# Patient Record
Sex: Female | Born: 1945
Health system: Southern US, Community
[De-identification: ages and names within clinical notes are randomized; demographics above are authoritative.]

## PROBLEM LIST (undated history)

## (undated) DIAGNOSIS — R740 Nonspecific elevation of levels of transaminase and lactic acid dehydrogenase [LDH]: Secondary | ICD-10-CM

## (undated) DIAGNOSIS — E039 Hypothyroidism, unspecified: Secondary | ICD-10-CM

## (undated) DIAGNOSIS — Z79899 Other long term (current) drug therapy: Secondary | ICD-10-CM

## (undated) DIAGNOSIS — F32A Depression, unspecified: Secondary | ICD-10-CM

## (undated) DIAGNOSIS — R5381 Other malaise: Secondary | ICD-10-CM

## (undated) DIAGNOSIS — F419 Anxiety disorder, unspecified: Secondary | ICD-10-CM

## (undated) DIAGNOSIS — R7401 Elevation of levels of liver transaminase levels: Secondary | ICD-10-CM

## (undated) DIAGNOSIS — E0590 Thyrotoxicosis, unspecified without thyrotoxic crisis or storm: Secondary | ICD-10-CM

## (undated) DIAGNOSIS — M545 Low back pain, unspecified: Secondary | ICD-10-CM

## (undated) DIAGNOSIS — R609 Edema, unspecified: Secondary | ICD-10-CM

## (undated) DIAGNOSIS — M8448XA Pathological fracture, other site, initial encounter for fracture: Secondary | ICD-10-CM

## (undated) DIAGNOSIS — K219 Gastro-esophageal reflux disease without esophagitis: Secondary | ICD-10-CM

## (undated) DIAGNOSIS — R5383 Other fatigue: Secondary | ICD-10-CM

## (undated) DIAGNOSIS — I10 Essential (primary) hypertension: Secondary | ICD-10-CM

## (undated) DIAGNOSIS — R000 Tachycardia, unspecified: Secondary | ICD-10-CM

## (undated) DIAGNOSIS — M797 Fibromyalgia: Secondary | ICD-10-CM

## (undated) DIAGNOSIS — R7402 Elevation of levels of lactic acid dehydrogenase (LDH): Secondary | ICD-10-CM

## (undated) DIAGNOSIS — R55 Syncope and collapse: Secondary | ICD-10-CM

## (undated) DIAGNOSIS — K2100 Gastro-esophageal reflux disease with esophagitis, without bleeding: Secondary | ICD-10-CM

## (undated) DIAGNOSIS — G4700 Insomnia, unspecified: Secondary | ICD-10-CM

## (undated) DIAGNOSIS — R7989 Other specified abnormal findings of blood chemistry: Secondary | ICD-10-CM

## (undated) DIAGNOSIS — L659 Nonscarring hair loss, unspecified: Secondary | ICD-10-CM

## (undated) DIAGNOSIS — R300 Dysuria: Secondary | ICD-10-CM

## (undated) DIAGNOSIS — D649 Anemia, unspecified: Secondary | ICD-10-CM

## (undated) DIAGNOSIS — F329 Major depressive disorder, single episode, unspecified: Secondary | ICD-10-CM

## (undated) DIAGNOSIS — R259 Unspecified abnormal involuntary movements: Secondary | ICD-10-CM

## (undated) DIAGNOSIS — F319 Bipolar disorder, unspecified: Secondary | ICD-10-CM

## (undated) HISTORY — DX: Fibromyalgia: M79.7

## (undated) HISTORY — DX: Elevation of levels of liver transaminase levels: R74.01

## (undated) HISTORY — DX: Low back pain: M54.5

## (undated) HISTORY — DX: Other long term (current) drug therapy: Z79.899

## (undated) HISTORY — DX: Nonspecific elevation of levels of transaminase and lactic acid dehydrogenase (ldh): R74.0

## (undated) HISTORY — DX: Gastro-esophageal reflux disease with esophagitis, without bleeding: K21.00

## (undated) HISTORY — DX: Low back pain, unspecified: M54.50

## (undated) HISTORY — DX: Other fatigue: R53.83

## (undated) HISTORY — DX: Syncope and collapse: R55

## (undated) HISTORY — DX: Essential (primary) hypertension: I10

## (undated) HISTORY — DX: Nonscarring hair loss, unspecified: L65.9

## (undated) HISTORY — DX: Elevation of levels of lactic acid dehydrogenase (LDH): R74.02

## (undated) HISTORY — DX: Edema, unspecified: R60.9

## (undated) HISTORY — DX: Gastro-esophageal reflux disease without esophagitis: K21.9

## (undated) HISTORY — DX: Thyrotoxicosis, unspecified without thyrotoxic crisis or storm: E05.90

## (undated) HISTORY — DX: Other specified abnormal findings of blood chemistry: R79.89

## (undated) HISTORY — DX: Hypothyroidism, unspecified: E03.9

## (undated) HISTORY — DX: Other malaise: R53.81

## (undated) HISTORY — DX: Tachycardia, unspecified: R00.0

## (undated) HISTORY — DX: Insomnia, unspecified: G47.00

## (undated) HISTORY — DX: Unspecified abnormal involuntary movements: R25.9

## (undated) HISTORY — DX: Anxiety disorder, unspecified: F41.9

## (undated) HISTORY — DX: Depression, unspecified: F32.A

## (undated) HISTORY — PX: TONSILLECTOMY: SHX5217

## (undated) HISTORY — DX: Major depressive disorder, single episode, unspecified: F32.9

## (undated) HISTORY — DX: Pathological fracture, other site, initial encounter for fracture: M84.48XA

## (undated) HISTORY — DX: Gastro-esophageal reflux disease with esophagitis: K21.0

## (undated) HISTORY — PX: LAPAROSCOPIC TUBAL LIGATION: SHX1937

## (undated) HISTORY — DX: Bipolar disorder, unspecified: F31.9

## (undated) HISTORY — DX: Anemia, unspecified: D64.9

## (undated) HISTORY — DX: Dysuria: R30.0

---

## 1998-01-24 ENCOUNTER — Other Ambulatory Visit: Admission: RE | Admit: 1998-01-24 | Discharge: 1998-01-24 | Payer: Self-pay | Admitting: Internal Medicine

## 1999-02-14 ENCOUNTER — Emergency Department (HOSPITAL_COMMUNITY): Admission: EM | Admit: 1999-02-14 | Discharge: 1999-02-14 | Payer: Self-pay | Admitting: Emergency Medicine

## 1999-04-29 ENCOUNTER — Ambulatory Visit (HOSPITAL_COMMUNITY): Admission: RE | Admit: 1999-04-29 | Discharge: 1999-04-29 | Payer: Self-pay | Admitting: Internal Medicine

## 1999-04-29 ENCOUNTER — Encounter: Payer: Self-pay | Admitting: Internal Medicine

## 2002-02-09 ENCOUNTER — Encounter: Payer: Self-pay | Admitting: Internal Medicine

## 2002-02-09 ENCOUNTER — Ambulatory Visit (HOSPITAL_COMMUNITY): Admission: RE | Admit: 2002-02-09 | Discharge: 2002-02-09 | Payer: Self-pay | Admitting: Internal Medicine

## 2009-03-03 ENCOUNTER — Encounter: Admission: RE | Admit: 2009-03-03 | Discharge: 2009-03-03 | Payer: Self-pay | Admitting: Internal Medicine

## 2009-03-13 ENCOUNTER — Ambulatory Visit (HOSPITAL_COMMUNITY): Admission: RE | Admit: 2009-03-13 | Discharge: 2009-03-13 | Payer: Self-pay | Admitting: Internal Medicine

## 2009-03-17 ENCOUNTER — Encounter: Admission: RE | Admit: 2009-03-17 | Discharge: 2009-03-17 | Payer: Self-pay | Admitting: Internal Medicine

## 2009-04-03 ENCOUNTER — Encounter: Admission: RE | Admit: 2009-04-03 | Discharge: 2009-04-03 | Payer: Self-pay | Admitting: Internal Medicine

## 2009-04-23 ENCOUNTER — Ambulatory Visit (HOSPITAL_COMMUNITY): Admission: RE | Admit: 2009-04-23 | Discharge: 2009-04-23 | Payer: Self-pay | Admitting: Internal Medicine

## 2009-11-05 ENCOUNTER — Encounter: Admission: RE | Admit: 2009-11-05 | Discharge: 2010-01-26 | Payer: Self-pay | Admitting: Internal Medicine

## 2010-04-23 ENCOUNTER — Ambulatory Visit (HOSPITAL_COMMUNITY): Admission: RE | Admit: 2010-04-23 | Discharge: 2010-04-23 | Payer: Self-pay | Admitting: Internal Medicine

## 2010-09-28 ENCOUNTER — Encounter: Payer: Self-pay | Admitting: Internal Medicine

## 2010-10-07 HISTORY — PX: CHOLECYSTECTOMY: SHX55

## 2010-10-29 ENCOUNTER — Ambulatory Visit
Admission: RE | Admit: 2010-10-29 | Discharge: 2010-10-29 | Disposition: A | Discharge status: Home / Self Care | Payer: MEDICARE | Source: Ambulatory Visit | Attending: Internal Medicine | Admitting: Internal Medicine

## 2010-10-29 ENCOUNTER — Other Ambulatory Visit: Payer: Self-pay | Admitting: Internal Medicine

## 2010-10-29 DIAGNOSIS — R748 Abnormal levels of other serum enzymes: Secondary | ICD-10-CM

## 2010-10-30 ENCOUNTER — Emergency Department (HOSPITAL_COMMUNITY): Payer: Medicare Other

## 2010-10-30 ENCOUNTER — Other Ambulatory Visit: Payer: Self-pay | Admitting: General Surgery

## 2010-10-30 ENCOUNTER — Observation Stay (HOSPITAL_COMMUNITY)
Admission: EM | Admit: 2010-10-30 | Discharge: 2010-10-31 | Disposition: A | Discharge status: Home / Self Care | Payer: Medicare Other | Attending: Surgery | Admitting: Surgery

## 2010-10-30 DIAGNOSIS — Z79899 Other long term (current) drug therapy: Secondary | ICD-10-CM | POA: Insufficient documentation

## 2010-10-30 DIAGNOSIS — M549 Dorsalgia, unspecified: Secondary | ICD-10-CM | POA: Insufficient documentation

## 2010-10-30 DIAGNOSIS — K219 Gastro-esophageal reflux disease without esophagitis: Secondary | ICD-10-CM | POA: Insufficient documentation

## 2010-10-30 DIAGNOSIS — R52 Pain, unspecified: Secondary | ICD-10-CM

## 2010-10-30 DIAGNOSIS — I10 Essential (primary) hypertension: Secondary | ICD-10-CM | POA: Insufficient documentation

## 2010-10-30 DIAGNOSIS — K8020 Calculus of gallbladder without cholecystitis without obstruction: Principal | ICD-10-CM | POA: Insufficient documentation

## 2010-10-30 DIAGNOSIS — R7989 Other specified abnormal findings of blood chemistry: Secondary | ICD-10-CM | POA: Insufficient documentation

## 2010-10-30 DIAGNOSIS — IMO0001 Reserved for inherently not codable concepts without codable children: Secondary | ICD-10-CM | POA: Insufficient documentation

## 2010-10-30 DIAGNOSIS — N390 Urinary tract infection, site not specified: Secondary | ICD-10-CM | POA: Insufficient documentation

## 2010-10-30 LAB — CBC
HCT: 38.7 % (ref 36.0–46.0)
MCH: 29.9 pg (ref 26.0–34.0)
MCV: 89.8 fL (ref 78.0–100.0)
Platelets: 165 10*3/uL (ref 150–400)
RBC: 4.31 MIL/uL (ref 3.87–5.11)

## 2010-10-30 LAB — URINALYSIS, ROUTINE W REFLEX MICROSCOPIC
Specific Gravity, Urine: 1.021 (ref 1.005–1.030)
Urine Glucose, Fasting: NEGATIVE mg/dL
pH: 6.5 (ref 5.0–8.0)

## 2010-10-30 LAB — COMPREHENSIVE METABOLIC PANEL
AST: 60 U/L — ABNORMAL HIGH (ref 0–37)
Albumin: 3.4 g/dL — ABNORMAL LOW (ref 3.5–5.2)
BUN: 9 mg/dL (ref 6–23)
Creatinine, Ser: 0.91 mg/dL (ref 0.4–1.2)
GFR calc Af Amer: >60 mL/min (ref >60)
Total Protein: 6.3 g/dL (ref 6.0–8.3)

## 2010-10-30 LAB — DIFFERENTIAL
Eosinophils Absolute: 0.3 10*3/uL (ref 0.0–0.7)
Lymphs Abs: 1 10*3/uL (ref 0.7–4.0)
Monocytes Relative: 13 % — ABNORMAL HIGH (ref 3–12)
Neutrophils Relative %: 66 % (ref 43–77)

## 2010-10-30 LAB — URINE MICROSCOPIC-ADD ON

## 2010-10-31 ENCOUNTER — Inpatient Hospital Stay (HOSPITAL_COMMUNITY)
Admission: EM | Admit: 2010-10-31 | Discharge: 2010-11-17 | DRG: 853 | Disposition: A | Discharge status: Home Health | Payer: Medicare Other | Attending: Surgery | Admitting: Surgery

## 2010-10-31 ENCOUNTER — Emergency Department (HOSPITAL_COMMUNITY): Payer: Medicare Other

## 2010-10-31 DIAGNOSIS — R5381 Other malaise: Secondary | ICD-10-CM | POA: Diagnosis not present

## 2010-10-31 DIAGNOSIS — R197 Diarrhea, unspecified: Secondary | ICD-10-CM | POA: Diagnosis not present

## 2010-10-31 DIAGNOSIS — F329 Major depressive disorder, single episode, unspecified: Secondary | ICD-10-CM | POA: Diagnosis present

## 2010-10-31 DIAGNOSIS — R7402 Elevation of levels of lactic acid dehydrogenase (LDH): Secondary | ICD-10-CM | POA: Diagnosis present

## 2010-10-31 DIAGNOSIS — K810 Acute cholecystitis: Secondary | ICD-10-CM | POA: Diagnosis present

## 2010-10-31 DIAGNOSIS — K449 Diaphragmatic hernia without obstruction or gangrene: Secondary | ICD-10-CM | POA: Diagnosis present

## 2010-10-31 DIAGNOSIS — E871 Hypo-osmolality and hyponatremia: Secondary | ICD-10-CM | POA: Diagnosis not present

## 2010-10-31 DIAGNOSIS — A419 Sepsis, unspecified organism: Principal | ICD-10-CM | POA: Diagnosis present

## 2010-10-31 DIAGNOSIS — K838 Other specified diseases of biliary tract: Secondary | ICD-10-CM | POA: Diagnosis present

## 2010-10-31 DIAGNOSIS — G8918 Other acute postprocedural pain: Secondary | ICD-10-CM | POA: Diagnosis present

## 2010-10-31 DIAGNOSIS — IMO0001 Reserved for inherently not codable concepts without codable children: Secondary | ICD-10-CM | POA: Diagnosis present

## 2010-10-31 DIAGNOSIS — Z9089 Acquired absence of other organs: Secondary | ICD-10-CM

## 2010-10-31 DIAGNOSIS — G8929 Other chronic pain: Secondary | ICD-10-CM | POA: Diagnosis present

## 2010-10-31 DIAGNOSIS — J90 Pleural effusion, not elsewhere classified: Secondary | ICD-10-CM | POA: Diagnosis not present

## 2010-10-31 DIAGNOSIS — K219 Gastro-esophageal reflux disease without esophagitis: Secondary | ICD-10-CM | POA: Diagnosis present

## 2010-10-31 DIAGNOSIS — K653 Choleperitonitis: Secondary | ICD-10-CM | POA: Diagnosis present

## 2010-10-31 DIAGNOSIS — E876 Hypokalemia: Secondary | ICD-10-CM | POA: Diagnosis not present

## 2010-10-31 DIAGNOSIS — R7401 Elevation of levels of liver transaminase levels: Secondary | ICD-10-CM | POA: Diagnosis present

## 2010-10-31 DIAGNOSIS — R110 Nausea: Secondary | ICD-10-CM | POA: Diagnosis present

## 2010-10-31 DIAGNOSIS — F3289 Other specified depressive episodes: Secondary | ICD-10-CM | POA: Diagnosis present

## 2010-10-31 DIAGNOSIS — K929 Disease of digestive system, unspecified: Secondary | ICD-10-CM | POA: Diagnosis present

## 2010-10-31 DIAGNOSIS — Y836 Removal of other organ (partial) (total) as the cause of abnormal reaction of the patient, or of later complication, without mention of misadventure at the time of the procedure: Secondary | ICD-10-CM | POA: Diagnosis present

## 2010-10-31 DIAGNOSIS — E8779 Other fluid overload: Secondary | ICD-10-CM | POA: Diagnosis not present

## 2010-10-31 DIAGNOSIS — J9600 Acute respiratory failure, unspecified whether with hypoxia or hypercapnia: Secondary | ICD-10-CM | POA: Diagnosis not present

## 2010-10-31 DIAGNOSIS — I498 Other specified cardiac arrhythmias: Secondary | ICD-10-CM | POA: Diagnosis not present

## 2010-10-31 DIAGNOSIS — E46 Unspecified protein-calorie malnutrition: Secondary | ICD-10-CM | POA: Diagnosis not present

## 2010-10-31 DIAGNOSIS — I10 Essential (primary) hypertension: Secondary | ICD-10-CM | POA: Diagnosis not present

## 2010-10-31 LAB — COMPREHENSIVE METABOLIC PANEL WITH GFR
ALT: 159 U/L — ABNORMAL HIGH (ref 0–35)
AST: 50 U/L — ABNORMAL HIGH (ref 0–37)
Albumin: 3.5 g/dL (ref 3.5–5.2)
Alkaline Phosphatase: 84 U/L (ref 39–117)
BUN: 8 mg/dL (ref 6–23)
CO2: 25 meq/L (ref 19–32)
Calcium: 8.9 mg/dL (ref 8.4–10.5)
Chloride: 95 meq/L — ABNORMAL LOW (ref 96–112)
Creatinine, Ser: 0.71 mg/dL (ref 0.4–1.2)
GFR calc non Af Amer: >60 mL/min
Glucose, Bld: 155 mg/dL — ABNORMAL HIGH (ref 70–99)
Potassium: 3.6 meq/L (ref 3.5–5.1)
Sodium: 129 meq/L — ABNORMAL LOW (ref 135–145)
Total Bilirubin: 2 mg/dL — ABNORMAL HIGH (ref 0.3–1.2)
Total Protein: 6.6 g/dL (ref 6.0–8.3)

## 2010-10-31 LAB — URINALYSIS, ROUTINE W REFLEX MICROSCOPIC
Bilirubin Urine: NEGATIVE
Ketones, ur: NEGATIVE mg/dL
Leukocytes, UA: NEGATIVE
Nitrite: NEGATIVE
Specific Gravity, Urine: 1.016 (ref 1.005–1.030)
Urine Glucose, Fasting: NEGATIVE mg/dL
pH: 7.5 (ref 5.0–8.0)

## 2010-10-31 LAB — DIFFERENTIAL
Basophils Absolute: 0 10*3/uL (ref 0.0–0.1)
Basophils Relative: 0 % (ref 0–1)
Lymphocytes Relative: 5 % — ABNORMAL LOW (ref 12–46)
Monocytes Absolute: 0.9 10*3/uL (ref 0.1–1.0)
Neutro Abs: 13.4 10*3/uL — ABNORMAL HIGH (ref 1.7–7.7)
Neutrophils Relative %: 89 % — ABNORMAL HIGH (ref 43–77)

## 2010-10-31 LAB — CBC
HCT: 37.2 % (ref 36.0–46.0)
HCT: 39.2 % (ref 36.0–46.0)
Hemoglobin: 13.2 g/dL (ref 12.0–15.0)
MCHC: 33.1 g/dL (ref 30.0–36.0)
MCHC: 33.7 g/dL (ref 30.0–36.0)
RBC: 4.42 MIL/uL (ref 3.87–5.11)
RDW: 13.1 % (ref 11.5–15.5)

## 2010-10-31 LAB — LIPASE, BLOOD: Lipase: 11 U/L (ref 11–59)

## 2010-11-01 ENCOUNTER — Inpatient Hospital Stay (HOSPITAL_COMMUNITY): Payer: Medicare Other

## 2010-11-01 DIAGNOSIS — R945 Abnormal results of liver function studies: Secondary | ICD-10-CM

## 2010-11-01 DIAGNOSIS — R932 Abnormal findings on diagnostic imaging of liver and biliary tract: Secondary | ICD-10-CM

## 2010-11-01 DIAGNOSIS — J9600 Acute respiratory failure, unspecified whether with hypoxia or hypercapnia: Secondary | ICD-10-CM

## 2010-11-01 DIAGNOSIS — R1084 Generalized abdominal pain: Secondary | ICD-10-CM

## 2010-11-01 LAB — CBC
Hemoglobin: 12.9 g/dL (ref 12.0–15.0)
MCH: 29.3 pg (ref 26.0–34.0)
MCHC: 33 g/dL (ref 30.0–36.0)
Platelets: 190 10*3/uL (ref 150–400)
Platelets: 171 10*3/uL (ref 150–400)
RBC: 4.17 MIL/uL (ref 3.87–5.11)
RDW: 13.4 % (ref 11.5–15.5)
WBC: 9.4 10*3/uL (ref 4.0–10.5)

## 2010-11-01 LAB — COMPREHENSIVE METABOLIC PANEL
AST: 29 U/L (ref 0–37)
CO2: 25 mEq/L (ref 19–32)
Calcium: 8.5 mg/dL (ref 8.4–10.5)
Creatinine, Ser: 0.68 mg/dL (ref 0.4–1.2)
GFR calc Af Amer: >60 mL/min (ref >60)
GFR calc non Af Amer: >60 mL/min (ref >60)
Sodium: 130 mEq/L — ABNORMAL LOW (ref 135–145)
Total Protein: 5.7 g/dL — ABNORMAL LOW (ref 6.0–8.3)

## 2010-11-01 LAB — BLOOD GAS, ARTERIAL
Acid-base deficit: 4.1 mmol/L — ABNORMAL HIGH (ref 0.0–2.0)
Drawn by: 308601
MECHVT: 500 mL
Patient temperature: 37
RATE: 14 resp/min
TCO2: 18.1 mmol/L (ref 0–100)
pH, Arterial: 7.377 (ref 7.350–7.400)

## 2010-11-01 LAB — URINE CULTURE: Culture: NO GROWTH

## 2010-11-01 LAB — BASIC METABOLIC PANEL
Chloride: 98 mEq/L (ref 96–112)
GFR calc Af Amer: >60 mL/min (ref >60)
Potassium: 3 mEq/L — ABNORMAL LOW (ref 3.5–5.1)
Sodium: 127 mEq/L — ABNORMAL LOW (ref 135–145)

## 2010-11-01 LAB — LACTIC ACID, PLASMA: Lactic Acid, Venous: 2.6 mmol/L — ABNORMAL HIGH (ref 0.5–2.2)

## 2010-11-01 LAB — PROCALCITONIN: Procalcitonin: 22.94 ng/mL

## 2010-11-01 MED ORDER — IOHEXOL 300 MG/ML  SOLN
125.0000 mL | Freq: Once | INTRAMUSCULAR | Status: AC | PRN
  Administered 2010-11-01: 125 mL via INTRAVENOUS

## 2010-11-02 DIAGNOSIS — R1084 Generalized abdominal pain: Secondary | ICD-10-CM

## 2010-11-02 DIAGNOSIS — K819 Cholecystitis, unspecified: Secondary | ICD-10-CM

## 2010-11-02 DIAGNOSIS — J9600 Acute respiratory failure, unspecified whether with hypoxia or hypercapnia: Secondary | ICD-10-CM

## 2010-11-02 DIAGNOSIS — A419 Sepsis, unspecified organism: Secondary | ICD-10-CM

## 2010-11-02 DIAGNOSIS — R932 Abnormal findings on diagnostic imaging of liver and biliary tract: Secondary | ICD-10-CM

## 2010-11-02 DIAGNOSIS — K838 Other specified diseases of biliary tract: Secondary | ICD-10-CM

## 2010-11-02 DIAGNOSIS — R6521 Severe sepsis with septic shock: Secondary | ICD-10-CM

## 2010-11-02 DIAGNOSIS — K650 Generalized (acute) peritonitis: Secondary | ICD-10-CM

## 2010-11-02 LAB — GLUCOSE, CAPILLARY: Glucose-Capillary: 91 mg/dL (ref 70–99)

## 2010-11-02 LAB — BLOOD GAS, ARTERIAL
Acid-base deficit: 1.5 mmol/L (ref 0.0–2.0)
Bicarbonate: 20.7 mEq/L (ref 20.0–24.0)
Drawn by: 308601
FIO2: 0.4 %
FIO2: 0.6 %
O2 Saturation: 99.4 %
Patient temperature: 37.8
Patient temperature: 39.1
TCO2: 16 mmol/L (ref 0–100)
TCO2: 18.1 mmol/L (ref 0–100)
pCO2 arterial: 26.6 mmHg — ABNORMAL LOW (ref 35.0–45.0)
pH, Arterial: 7.457 — ABNORMAL HIGH (ref 7.350–7.400)

## 2010-11-02 LAB — COMPREHENSIVE METABOLIC PANEL
ALT: 76 U/L — ABNORMAL HIGH (ref 0–35)
AST: 37 U/L (ref 0–37)
Alkaline Phosphatase: 43 U/L (ref 39–117)
CO2: 21 mEq/L (ref 19–32)
Chloride: 103 mEq/L (ref 96–112)
GFR calc Af Amer: >60 mL/min (ref >60)
GFR calc non Af Amer: 50 mL/min — ABNORMAL LOW (ref >60)
Potassium: 3.8 mEq/L (ref 3.5–5.1)
Sodium: 130 mEq/L — ABNORMAL LOW (ref 135–145)
Total Bilirubin: 1.3 mg/dL — ABNORMAL HIGH (ref 0.3–1.2)

## 2010-11-02 LAB — BASIC METABOLIC PANEL
CO2: 20 mEq/L (ref 19–32)
Glucose, Bld: 189 mg/dL — ABNORMAL HIGH (ref 70–99)
Potassium: 3.1 mEq/L — ABNORMAL LOW (ref 3.5–5.1)
Sodium: 124 mEq/L — ABNORMAL LOW (ref 135–145)

## 2010-11-02 LAB — CARBOXYHEMOGLOBIN
O2 Saturation: 62.8 %
Total hemoglobin: 11.4 g/dL — ABNORMAL LOW (ref 12.5–16.0)
Total hemoglobin: 11.9 g/dL — ABNORMAL LOW (ref 12.5–16.0)

## 2010-11-02 LAB — CBC
HCT: 34.3 % — ABNORMAL LOW (ref 36.0–46.0)
Hemoglobin: 11.2 g/dL — ABNORMAL LOW (ref 12.0–15.0)
MCV: 88.6 fL (ref 78.0–100.0)
WBC: 18.4 10*3/uL — ABNORMAL HIGH (ref 4.0–10.5)

## 2010-11-02 LAB — DIFFERENTIAL
Basophils Relative: 0 % (ref 0–1)
Eosinophils Relative: 0 % (ref 0–5)
Lymphs Abs: 1.1 10*3/uL (ref 0.7–4.0)
Monocytes Relative: 6 % (ref 3–12)
Neutrophils Relative %: 88 % — ABNORMAL HIGH (ref 43–77)

## 2010-11-02 LAB — CARDIAC PANEL(CRET KIN+CKTOT+MB+TROPI)
CK, MB: 1 ng/mL (ref 0.3–4.0)
Relative Index: INVALID (ref 0.0–2.5)
Total CK: 41 U/L (ref 7–177)
Troponin I: 0.01 ng/mL (ref 0.00–0.06)

## 2010-11-02 LAB — PROTIME-INR: Prothrombin Time: 17 seconds — ABNORMAL HIGH (ref 11.6–15.2)

## 2010-11-02 LAB — TSH: TSH: 1.027 u[IU]/mL (ref 0.350–4.500)

## 2010-11-03 ENCOUNTER — Inpatient Hospital Stay (HOSPITAL_COMMUNITY): Payer: Medicare Other

## 2010-11-03 ENCOUNTER — Encounter: Payer: Self-pay | Admitting: Internal Medicine

## 2010-11-03 DIAGNOSIS — R6521 Severe sepsis with septic shock: Secondary | ICD-10-CM

## 2010-11-03 DIAGNOSIS — J9600 Acute respiratory failure, unspecified whether with hypoxia or hypercapnia: Secondary | ICD-10-CM

## 2010-11-03 DIAGNOSIS — A419 Sepsis, unspecified organism: Secondary | ICD-10-CM

## 2010-11-03 DIAGNOSIS — K819 Cholecystitis, unspecified: Secondary | ICD-10-CM

## 2010-11-03 DIAGNOSIS — K838 Other specified diseases of biliary tract: Secondary | ICD-10-CM

## 2010-11-03 DIAGNOSIS — K650 Generalized (acute) peritonitis: Secondary | ICD-10-CM

## 2010-11-03 DIAGNOSIS — R932 Abnormal findings on diagnostic imaging of liver and biliary tract: Secondary | ICD-10-CM

## 2010-11-03 HISTORY — PX: ERCP: SHX60

## 2010-11-03 LAB — GLUCOSE, CAPILLARY
Glucose-Capillary: 74 mg/dL (ref 70–99)
Glucose-Capillary: 78 mg/dL (ref 70–99)

## 2010-11-03 LAB — CBC
HCT: 28.9 % — ABNORMAL LOW (ref 36.0–46.0)
HCT: 29.1 % — ABNORMAL LOW (ref 36.0–46.0)
Hemoglobin: 9.6 g/dL — ABNORMAL LOW (ref 12.0–15.0)
Hemoglobin: 9.7 g/dL — ABNORMAL LOW (ref 12.0–15.0)
MCH: 29.1 pg (ref 26.0–34.0)
MCH: 29.4 pg (ref 26.0–34.0)
MCHC: 33.2 g/dL (ref 30.0–36.0)
MCHC: 33.3 g/dL (ref 30.0–36.0)
MCV: 87.6 fL (ref 78.0–100.0)
MCV: 88.2 fL (ref 78.0–100.0)
Platelets: 207 10*3/uL (ref 150–400)
RBC: 3.3 MIL/uL — ABNORMAL LOW (ref 3.87–5.11)
RDW: 13.8 % (ref 11.5–15.5)
WBC: 10.6 10*3/uL — ABNORMAL HIGH (ref 4.0–10.5)

## 2010-11-03 LAB — BASIC METABOLIC PANEL
BUN: 14 mg/dL (ref 6–23)
BUN: 8 mg/dL (ref 6–23)
CO2: 18 mEq/L — ABNORMAL LOW (ref 19–32)
CO2: 18 mEq/L — ABNORMAL LOW (ref 19–32)
Calcium: 7.3 mg/dL — ABNORMAL LOW (ref 8.4–10.5)
Calcium: 7.3 mg/dL — ABNORMAL LOW (ref 8.4–10.5)
Chloride: 110 mEq/L (ref 96–112)
Chloride: 110 mEq/L (ref 96–112)
Creatinine, Ser: 0.74 mg/dL (ref 0.4–1.2)
Creatinine, Ser: 0.73 mg/dL (ref 0.4–1.2)
GFR calc Af Amer: >60 mL/min (ref >60)
GFR calc non Af Amer: >60 mL/min (ref >60)
Glucose, Bld: 78 mg/dL (ref 70–99)
Glucose, Bld: 69 mg/dL — ABNORMAL LOW (ref 70–99)
Potassium: 3 mEq/L — ABNORMAL LOW (ref 3.5–5.1)
Sodium: 135 mEq/L (ref 135–145)

## 2010-11-03 LAB — BLOOD GAS, ARTERIAL
Acid-base deficit: 5.8 mmol/L — ABNORMAL HIGH (ref 0.0–2.0)
Bicarbonate: 17 mEq/L — ABNORMAL LOW (ref 20.0–24.0)
Drawn by: 308601
FIO2: 0.3 %
O2 Saturation: 98.2 %
RATE: 14 resp/min
TCO2: 15.8 mmol/L (ref 0–100)
pO2, Arterial: 90.8 mmHg (ref 80.0–100.0)

## 2010-11-03 LAB — LIPASE, BLOOD: Lipase: 10 U/L — ABNORMAL LOW (ref 11–59)

## 2010-11-03 LAB — PHOSPHORUS: Phosphorus: 2.6 mg/dL (ref 2.3–4.6)

## 2010-11-03 LAB — PROCALCITONIN: Procalcitonin: 11.34 ng/mL

## 2010-11-03 LAB — MAGNESIUM: Magnesium: 1.4 mg/dL — ABNORMAL LOW (ref 1.5–2.5)

## 2010-11-04 ENCOUNTER — Inpatient Hospital Stay (HOSPITAL_COMMUNITY): Payer: Medicare Other

## 2010-11-04 DIAGNOSIS — K838 Other specified diseases of biliary tract: Secondary | ICD-10-CM

## 2010-11-04 LAB — COMPREHENSIVE METABOLIC PANEL
ALT: 38 U/L — ABNORMAL HIGH (ref 0–35)
AST: 15 U/L (ref 0–37)
Albumin: 1.7 g/dL — ABNORMAL LOW (ref 3.5–5.2)
Alkaline Phosphatase: 57 U/L (ref 39–117)
Chloride: 110 mEq/L (ref 96–112)
GFR calc Af Amer: >60 mL/min (ref >60)
Potassium: 3.5 mEq/L (ref 3.5–5.1)
Sodium: 135 mEq/L (ref 135–145)
Total Bilirubin: 1.6 mg/dL — ABNORMAL HIGH (ref 0.3–1.2)
Total Protein: 4.2 g/dL — ABNORMAL LOW (ref 6.0–8.3)

## 2010-11-04 LAB — CBC
MCV: 87.8 fL (ref 78.0–100.0)
Platelets: 244 10*3/uL (ref 150–400)
RBC: 3.86 MIL/uL — ABNORMAL LOW (ref 3.87–5.11)
RDW: 13.9 % (ref 11.5–15.5)
WBC: 13.6 10*3/uL — ABNORMAL HIGH (ref 4.0–10.5)

## 2010-11-04 LAB — BASIC METABOLIC PANEL
BUN: 11 mg/dL (ref 6–23)
Creatinine, Ser: 0.73 mg/dL (ref 0.4–1.2)
GFR calc non Af Amer: >60 mL/min (ref >60)
Glucose, Bld: 91 mg/dL (ref 70–99)

## 2010-11-04 LAB — CULTURE, ROUTINE-ABSCESS

## 2010-11-04 LAB — DIFFERENTIAL
Basophils Absolute: 0 10*3/uL (ref 0.0–0.1)
Eosinophils Absolute: 0 10*3/uL (ref 0.0–0.7)
Eosinophils Relative: 0 % (ref 0–5)
Lymphocytes Relative: 6 % — ABNORMAL LOW (ref 12–46)
Lymphs Abs: 0.8 10*3/uL (ref 0.7–4.0)
Neutrophils Relative %: 86 % — ABNORMAL HIGH (ref 43–77)

## 2010-11-04 LAB — MAGNESIUM: Magnesium: 1.6 mg/dL (ref 1.5–2.5)

## 2010-11-05 ENCOUNTER — Inpatient Hospital Stay (HOSPITAL_COMMUNITY): Payer: Medicare Other

## 2010-11-05 LAB — BASIC METABOLIC PANEL
CO2: 24 mEq/L (ref 19–32)
Chloride: 108 mEq/L (ref 96–112)
Creatinine, Ser: 0.6 mg/dL (ref 0.4–1.2)
GFR calc Af Amer: >60 mL/min (ref >60)

## 2010-11-05 LAB — CBC
HCT: 29.6 % — ABNORMAL LOW (ref 36.0–46.0)
MCHC: 32.8 g/dL (ref 30.0–36.0)
WBC: 9 10*3/uL (ref 4.0–10.5)

## 2010-11-05 LAB — CROSSMATCH: ABO/RH(D): A POS

## 2010-11-06 ENCOUNTER — Inpatient Hospital Stay (HOSPITAL_COMMUNITY): Payer: Medicare Other

## 2010-11-06 LAB — COMPREHENSIVE METABOLIC PANEL
Alkaline Phosphatase: 34 U/L — ABNORMAL LOW (ref 39–117)
BUN: 9 mg/dL (ref 6–23)
CO2: 29 mEq/L (ref 19–32)
Chloride: 102 mEq/L (ref 96–112)
GFR calc non Af Amer: >60 mL/min (ref >60)
Glucose, Bld: 83 mg/dL (ref 70–99)
Potassium: 3 mEq/L — ABNORMAL LOW (ref 3.5–5.1)
Total Bilirubin: 0.9 mg/dL (ref 0.3–1.2)

## 2010-11-06 LAB — ANAEROBIC CULTURE

## 2010-11-06 LAB — CBC
HCT: 30.8 % — ABNORMAL LOW (ref 36.0–46.0)
MCV: 89 fL (ref 78.0–100.0)
RDW: 14.1 % (ref 11.5–15.5)
WBC: 6.7 10*3/uL (ref 4.0–10.5)

## 2010-11-06 LAB — PHOSPHORUS: Phosphorus: 3.4 mg/dL (ref 2.3–4.6)

## 2010-11-06 LAB — MAGNESIUM: Magnesium: 1.6 mg/dL (ref 1.5–2.5)

## 2010-11-07 ENCOUNTER — Inpatient Hospital Stay (HOSPITAL_COMMUNITY): Payer: Medicare Other

## 2010-11-07 DIAGNOSIS — K819 Cholecystitis, unspecified: Secondary | ICD-10-CM

## 2010-11-07 DIAGNOSIS — K650 Generalized (acute) peritonitis: Secondary | ICD-10-CM

## 2010-11-07 DIAGNOSIS — Z9911 Dependence on respirator [ventilator] status: Secondary | ICD-10-CM

## 2010-11-07 DIAGNOSIS — J9600 Acute respiratory failure, unspecified whether with hypoxia or hypercapnia: Secondary | ICD-10-CM

## 2010-11-07 LAB — CULTURE, BLOOD (ROUTINE X 2)
Culture  Setup Time: 201202262354
Culture: NO GROWTH

## 2010-11-07 LAB — URINE CULTURE
Colony Count: NO GROWTH
Culture: NO GROWTH
Special Requests: NEGATIVE

## 2010-11-07 LAB — MAGNESIUM: Magnesium: 1.8 mg/dL (ref 1.5–2.5)

## 2010-11-07 LAB — CBC
MCHC: 31.9 g/dL (ref 30.0–36.0)
RDW: 14.1 % (ref 11.5–15.5)

## 2010-11-07 LAB — COMPREHENSIVE METABOLIC PANEL
ALT: 16 U/L (ref 0–35)
AST: 16 U/L (ref 0–37)
Calcium: 7.3 mg/dL — ABNORMAL LOW (ref 8.4–10.5)
Creatinine, Ser: 0.63 mg/dL (ref 0.4–1.2)
GFR calc Af Amer: >60 mL/min (ref >60)
Sodium: 138 mEq/L (ref 135–145)
Total Protein: 4 g/dL — ABNORMAL LOW (ref 6.0–8.3)

## 2010-11-07 LAB — PROCALCITONIN: Procalcitonin: 0.63 ng/mL

## 2010-11-08 ENCOUNTER — Inpatient Hospital Stay (HOSPITAL_COMMUNITY): Payer: Medicare Other

## 2010-11-08 LAB — CBC
Hemoglobin: 8.8 g/dL — ABNORMAL LOW (ref 12.0–15.0)
MCH: 28.9 pg (ref 26.0–34.0)
MCHC: 32.1 g/dL (ref 30.0–36.0)
MCV: 89.8 fL (ref 78.0–100.0)
WBC: 6.5 10*3/uL (ref 4.0–10.5)

## 2010-11-08 LAB — MAGNESIUM: Magnesium: 1.7 mg/dL (ref 1.5–2.5)

## 2010-11-08 LAB — DIFFERENTIAL
Basophils Relative: 0 % (ref 0–1)
Eosinophils Absolute: 0.2 10*3/uL (ref 0.0–0.7)
Eosinophils Relative: 3 % (ref 0–5)
Lymphs Abs: 1 10*3/uL (ref 0.7–4.0)
Monocytes Absolute: 0.7 10*3/uL (ref 0.1–1.0)
Neutro Abs: 4.6 10*3/uL (ref 1.7–7.7)
Neutrophils Relative %: 71 % (ref 43–77)

## 2010-11-08 LAB — COMPREHENSIVE METABOLIC PANEL
Alkaline Phosphatase: 27 U/L — ABNORMAL LOW (ref 39–117)
BUN: 6 mg/dL (ref 6–23)
CO2: 32 mEq/L (ref 19–32)
Chloride: 98 mEq/L (ref 96–112)
GFR calc non Af Amer: >60 mL/min (ref >60)
Glucose, Bld: 128 mg/dL — ABNORMAL HIGH (ref 70–99)
Potassium: 2.7 mEq/L — CL (ref 3.5–5.1)
Total Bilirubin: 0.5 mg/dL (ref 0.3–1.2)

## 2010-11-08 LAB — GLUCOSE, CAPILLARY
Glucose-Capillary: 117 mg/dL — ABNORMAL HIGH (ref 70–99)
Glucose-Capillary: 121 mg/dL — ABNORMAL HIGH (ref 70–99)
Glucose-Capillary: 125 mg/dL — ABNORMAL HIGH (ref 70–99)

## 2010-11-08 LAB — CULTURE, BAL-QUANTITATIVE W GRAM STAIN
Colony Count: NO GROWTH
Culture: NO GROWTH

## 2010-11-08 LAB — PHOSPHORUS: Phosphorus: 2.9 mg/dL (ref 2.3–4.6)

## 2010-11-08 LAB — PREALBUMIN: Prealbumin: 4 mg/dL — ABNORMAL LOW (ref 17.0–34.0)

## 2010-11-09 ENCOUNTER — Inpatient Hospital Stay (HOSPITAL_COMMUNITY): Payer: Medicare Other

## 2010-11-09 LAB — CBC
HCT: 30.4 % — ABNORMAL LOW (ref 36.0–46.0)
Hemoglobin: 9.7 g/dL — ABNORMAL LOW (ref 12.0–15.0)
MCH: 29 pg (ref 26.0–34.0)
MCHC: 31.9 g/dL (ref 30.0–36.0)

## 2010-11-09 LAB — CHOLESTEROL, TOTAL: Cholesterol: 81 mg/dL (ref 0–200)

## 2010-11-09 LAB — COMPREHENSIVE METABOLIC PANEL
CO2: 29 mEq/L (ref 19–32)
Calcium: 7.6 mg/dL — ABNORMAL LOW (ref 8.4–10.5)
Creatinine, Ser: 0.47 mg/dL (ref 0.4–1.2)
GFR calc Af Amer: >60 mL/min (ref >60)
GFR calc non Af Amer: >60 mL/min (ref >60)
Glucose, Bld: 123 mg/dL — ABNORMAL HIGH (ref 70–99)

## 2010-11-09 LAB — TRIGLYCERIDES: Triglycerides: 134 mg/dL (ref <150)

## 2010-11-09 LAB — GLUCOSE, CAPILLARY
Glucose-Capillary: 113 mg/dL — ABNORMAL HIGH (ref 70–99)
Glucose-Capillary: 142 mg/dL — ABNORMAL HIGH (ref 70–99)

## 2010-11-09 LAB — PHOSPHORUS: Phosphorus: 3.4 mg/dL (ref 2.3–4.6)

## 2010-11-09 LAB — DIFFERENTIAL
Basophils Relative: 0 % (ref 0–1)
Monocytes Absolute: 0.8 10*3/uL (ref 0.1–1.0)
Monocytes Relative: 10 % (ref 3–12)
Neutro Abs: 5.6 10*3/uL (ref 1.7–7.7)

## 2010-11-09 LAB — MAGNESIUM: Magnesium: 1.8 mg/dL (ref 1.5–2.5)

## 2010-11-09 NOTE — Op Note (Signed)
NAME:  Sarah Miranda, Sarah Miranda                ACCOUNT NO.:  [id number]  MEDICAL RECORD NO.:  [id number]           PATIENT TYPE:  I  LOCATION:  1230                         FACILITY:  [facility]  PHYSICIAN:  Thornton Park. Daphine Deutscher, MD  DATE OF BIRTH:  Aug 14, 1946  DATE OF PROCEDURE:  11/01/2010 DATE OF DISCHARGE:                              OPERATIVE REPORT   PREOPERATIVE DIAGNOSIS:  Two days status post laparoscopic cholecystectomy with bile leak and peritonitis.  POSTOPERATIVE DIAGNOSIS:  Two days status post laparoscopic cholecystectomy with bile leak and peritonitis.  PROCEDURE:  Laparoscopy, evaluation of the gallbladder bed with clipping of accessory bile duct in the gallbladder bed, a re-clipping of the cystic duct, irrigation of the abdominal cavity with 6 L of saline, placement of #19 Blake drain into the peritoneal cavity.  SURGEON:  Thornton Park. Daphine Deutscher, MD  ASSISTANT:  Anselm Pancoast. Zachery Dakins, M.D.  DRAINS:  One 101 Blake drain.  DESCRIPTION OF PROCEDURE:  Mrs. Mackley was taken to room 1 on Sunday evening, November 01, 2010 and given general anesthesia.  The abdomen was prepped with PCMX and draped sterilely.  A time-out was performed. We opened up the Dermabond closure of the wounds and I went in through the previously made umbilical incision and then upper midline and two 5s laterally and reestablished the ports.  This was done without difficulty.  Upon entering the first, we took cultures of the golden material.  This appeared to be bile without really much in the way of flocculation or evidence of enteric contents.  It was all consistent with a pure bile leak, although a very big bile leak.  We aspirated this and went up and retracted the liver.  Along the gallbladder bed, there appeared to be a linear structure with a clip downstream clipping off a little bile duct.  I went above that up toward what would be the fundus and found some bile-stained area that I thought could be an area  where this may have been a bile channel that was clipped on one side but not on the other where there could be some back flow of bile and clipped that area as well.  We irrigated copiously and looked for an obvious bile leak.  We then went down.  I had read Dr. Arita Miss note about the scissoring of a clip and went down on the cystic duct, elevated that and there was a lower clip that was easily removed and I placed another one below the other two to make a good third closure.  Again, we irrigated. I then used a total of 6 L of saline to irrigate the abdomen, placing her in different anatomic positions up and down to allow me to flood out the abdomen try to irrigate.  She did have some exudative material kind of on some of the loops of bowel.  Following this irrigation, most of this was sucked out as well as a lot of it came out in the 19 [place] drain that we brought out through the lateral port on the right.  The patient had a central line placed by Dr. Rica Mast who  was left intubated and carried up to the intensive care unit for postoperative recovery and subsequent management.  Plan will be for ERCP stenting postop.     Thornton Park Daphine Deutscher, MD     MBM/MEDQ  D:  11/01/2010  T:  11/01/2010  Job:  [id number]  Electronically Signed by Luretha Murphy MD on 11/09/2010 01:38:04 PM

## 2010-11-10 ENCOUNTER — Inpatient Hospital Stay (HOSPITAL_COMMUNITY): Payer: Medicare Other

## 2010-11-10 LAB — GLUCOSE, CAPILLARY
Glucose-Capillary: 122 mg/dL — ABNORMAL HIGH (ref 70–99)
Glucose-Capillary: 148 mg/dL — ABNORMAL HIGH (ref 70–99)
Glucose-Capillary: 138 mg/dL — ABNORMAL HIGH (ref 70–99)
Glucose-Capillary: 119 mg/dL — ABNORMAL HIGH (ref 70–99)
Glucose-Capillary: 98 mg/dL (ref 70–99)

## 2010-11-10 LAB — BASIC METABOLIC PANEL
BUN: 8 mg/dL (ref 6–23)
Chloride: 101 mEq/L (ref 96–112)
GFR calc Af Amer: >60 mL/min (ref >60)
GFR calc non Af Amer: >60 mL/min (ref >60)
Potassium: 3.4 mEq/L — ABNORMAL LOW (ref 3.5–5.1)
Sodium: 138 mEq/L (ref 135–145)

## 2010-11-10 LAB — PHOSPHORUS: Phosphorus: 3.5 mg/dL (ref 2.3–4.6)

## 2010-11-10 LAB — CBC
MCV: 90.8 fL (ref 78.0–100.0)
Platelets: 375 10*3/uL (ref 150–400)
RBC: 3.14 MIL/uL — ABNORMAL LOW (ref 3.87–5.11)
RDW: 14 % (ref 11.5–15.5)
WBC: 6.3 10*3/uL (ref 4.0–10.5)

## 2010-11-10 LAB — MAGNESIUM: Magnesium: 2.3 mg/dL (ref 1.5–2.5)

## 2010-11-11 ENCOUNTER — Inpatient Hospital Stay (HOSPITAL_COMMUNITY): Payer: Medicare Other

## 2010-11-11 LAB — CBC
HCT: 27.7 % — ABNORMAL LOW (ref 36.0–46.0)
Hemoglobin: 8.8 g/dL — ABNORMAL LOW (ref 12.0–15.0)
MCHC: 31.8 g/dL (ref 30.0–36.0)
MCV: 91.1 fL (ref 78.0–100.0)
RDW: 14.1 % (ref 11.5–15.5)

## 2010-11-11 LAB — GLUCOSE, CAPILLARY
Glucose-Capillary: 130 mg/dL — ABNORMAL HIGH (ref 70–99)
Glucose-Capillary: 121 mg/dL — ABNORMAL HIGH (ref 70–99)
Glucose-Capillary: 92 mg/dL (ref 70–99)

## 2010-11-11 LAB — BASIC METABOLIC PANEL
BUN: 11 mg/dL (ref 6–23)
CO2: 28 mEq/L (ref 19–32)
Calcium: 7.9 mg/dL — ABNORMAL LOW (ref 8.4–10.5)
GFR calc non Af Amer: >60 mL/min (ref >60)
Glucose, Bld: 95 mg/dL (ref 70–99)
Potassium: 3.6 mEq/L (ref 3.5–5.1)
Sodium: 137 mEq/L (ref 135–145)

## 2010-11-11 LAB — CLOSTRIDIUM DIFFICILE BY PCR: Toxigenic C. Difficile by PCR: NEGATIVE

## 2010-11-12 DIAGNOSIS — I471 Supraventricular tachycardia: Secondary | ICD-10-CM

## 2010-11-12 LAB — CBC
HCT: 28.5 % — ABNORMAL LOW (ref 36.0–46.0)
Hemoglobin: 9.1 g/dL — ABNORMAL LOW (ref 12.0–15.0)
MCV: 90.8 fL (ref 78.0–100.0)
WBC: 8.3 10*3/uL (ref 4.0–10.5)

## 2010-11-12 LAB — GLUCOSE, CAPILLARY
Glucose-Capillary: 108 mg/dL — ABNORMAL HIGH (ref 70–99)
Glucose-Capillary: 103 mg/dL — ABNORMAL HIGH (ref 70–99)
Glucose-Capillary: 117 mg/dL — ABNORMAL HIGH (ref 70–99)
Glucose-Capillary: 106 mg/dL — ABNORMAL HIGH (ref 70–99)
Glucose-Capillary: 104 mg/dL — ABNORMAL HIGH (ref 70–99)

## 2010-11-12 LAB — PHOSPHORUS: Phosphorus: 4 mg/dL (ref 2.3–4.6)

## 2010-11-12 LAB — COMPREHENSIVE METABOLIC PANEL
AST: 23 U/L (ref 0–37)
Albumin: 1.9 g/dL — ABNORMAL LOW (ref 3.5–5.2)
Calcium: 8.3 mg/dL — ABNORMAL LOW (ref 8.4–10.5)
Creatinine, Ser: 0.67 mg/dL (ref 0.4–1.2)
GFR calc Af Amer: >60 mL/min (ref >60)

## 2010-11-12 LAB — CULTURE, BLOOD (ROUTINE X 2): Culture  Setup Time: 201203022155

## 2010-11-12 NOTE — Procedures (Signed)
Summary: ERCP  Patient: [name] Note: All result statuses are Final unless otherwise noted.  Tests: (1) ERCP (ERC)   ERC ERCP                  DONE     [facility]     [address] [place], [place]  [address]           ERCP PROCEDURE REPORT           PATIENT:  Sarah Miranda, Sarah Miranda  MR#:  [id number]     BIRTHDATE:  12/03/1945  GENDER:  female           ENDOSCOPIST:  Iva Boop, MD, [facility]     ASSISTANT:           PROCEDURE DATE:  11/03/2010     PROCEDURE:  ERCP with stent placement           INDICATIONS:  bile leak after laparoscopic cholecystectomy, also ?     of stone on IOC           MEDICATIONS:   See Anesthesia Report. On running IV Ivanz     TOPICAL ANESTHETIC:  none           DESCRIPTION OF PROCEDURE:   After the risks benefits and     alternatives of the procedure were thoroughly explained, informed     consent was obtained.  The  endoscope was introduced through the     mouth and advanced to the second portion of the duodenum.           The examination demonstrated a hiatal hernia but otherwise normal     stomach and duodenum. The esophagus was not seen well. The major     papilla was normal in appearance and bile was in the intestine.     The papilla was cannulated and deep cannulation of the bile duct     obtained using a wire. Contrast opacified the biliary tree showing     a dilated common bile and hepatic ducts up to 12mm. No leak was     seen. Intrahepatics were normal and no stones identified.     Gallbladder surgically absent with multiple clips at cystic duct     stump. A drain was in the subhepatic space. Given the scenario a 7     cm 10 [place] biliary stent was placed to help heal the bile     leak.The pancreas was not cannulated by intent.   The scope was     then completely withdrawn from the patient and the procedure     terminated.     <<PROCEDUREIMAGES>>           COMPLICATIONS:  None           ENDOSCOPIC IMPRESSION:  1)  Dilated extrahepatic bile ducts (12 mm     maximum)     2) No leak identified and no stones seen.     3) Normal intrahepatics     4) S/P cholecystectomy           RECOMMENDATIONS:     Observe and support.     ERCP recall 1 month to arrange for stent removal within 3 months     from now.           Iva Boop, MD, [facility]           CC:  Luretha Murphy, MD  n.     eSIGNED:   Iva Boop at 11/03/2010 02:33 PM           Richardson Landry, [id number]  Note: An exclamation mark (!) indicates a result that was not dispersed into the flowsheet. Document Creation Date: 11/03/2010 2:41 PM _______________________________________________________________________  (1) Order result status: Final Collection or observation date-time: 11/03/2010 13:03 Requested date-time:  Receipt date-time:  Reported date-time:  Referring Physician:   Ordering Physician: Stan Head [phone]) Specimen Source:  Source: Launa Grill Order Number: [id number] Lab site:

## 2010-11-13 DIAGNOSIS — I359 Nonrheumatic aortic valve disorder, unspecified: Secondary | ICD-10-CM

## 2010-11-13 LAB — BASIC METABOLIC PANEL
Calcium: 8.4 mg/dL (ref 8.4–10.5)
Creatinine, Ser: 0.73 mg/dL (ref 0.4–1.2)
GFR calc Af Amer: >60 mL/min (ref >60)
GFR calc non Af Amer: >60 mL/min (ref >60)
Glucose, Bld: 108 mg/dL — ABNORMAL HIGH (ref 70–99)
Sodium: 138 mEq/L (ref 135–145)

## 2010-11-13 LAB — GLUCOSE, CAPILLARY: Glucose-Capillary: 109 mg/dL — ABNORMAL HIGH (ref 70–99)

## 2010-11-14 LAB — CLOSTRIDIUM DIFFICILE BY PCR: Toxigenic C. Difficile by PCR: NEGATIVE

## 2010-11-16 NOTE — H&P (Signed)
NAME:  Sarah Miranda, Sarah Miranda                ACCOUNT NO.:  [id number]  MEDICAL RECORD NO.:  [id number]           PATIENT TYPE:  I  LOCATION:  1524                         FACILITY:  [facility]  PHYSICIAN:  Ardeth Sportsman, MD     DATE OF BIRTH:  05-Jul-1946  DATE OF ADMISSION:  10/31/2010 DATE OF DISCHARGE:                             HISTORY & PHYSICAL   PRIMARY CARE PHYSICIAN:  Lenon Curt. Chilton Si, MD  REQUESTING PHYSICIAN:  Samuel Jester, DO  SURGEON:  Almond Lint, MD, for the [facility].  CHIEF COMPLAINT:  Worsening abdominal pain and nausea, status post lap cholecystectomy.  HISTORY OF PRESENT ILLNESS:  Sarah Miranda is a 65 year old obese female with fibromyalgia and chronic pain issues who had upper abdominal pain and transaminitis and cholecystolithiasis.  This was assumed to be the etiology of her discomfort.  She underwent laparoscopic cholecystectomy yesterday.  There was noted to be, maybe a trace cholecystolithiasis versus sludge in the common bile duct, but it was nonobstructive.  Dr. Daphine Deutscher saw her this morning.  She was sore, but tolerating p.o.  She has had urine output and flatus.  She claimed to have good overall pain control.  She very much wished to come home.  Dr. Daphine Deutscher offered to her stay until we could follow another day, but she wished to go home and so she finally was recently discharged home.  The patient notes when she was going over from the bed to the wheelchair, she started feeling some pain and discomfort.  By the time she was home, she was much more sore.  She took a pain medication and it calmed down.  Then, she had another flare-up about an hour and a half later.  She called me concerned.  I recommended that she switch down to liquids only and continue pain medications.  I recommended increasing heat control.  I noted if her nausea and pain did not improve, we may need to see her sooner.  She did not want to come to the  ER.  Three hours later, while I am in the ER, Dr. Clarene Duke tells me that the patient came to the ER.  She notes that the patient required 2 doses of IV pain medicine.  When that was done, it started to improve.  However, she is little anxious.  She is debating about going home, but Dr. Clarene Duke felt that the patient would benefit from being staying.  She had some hypertension issues and therefore she needs evaluation.  The patient denies any hematemesis or melena.  She does have known large hiatal hernia with reflux disease and that does not seem to be changed, no major change in her mild dysphagia.  She has not had any diarrhea. She feels some soreness in her umbical incision in her upper quadrant and it is uncomfortable to take some deep breaths and coughing.  PAST MEDICAL HISTORY: 1. Fibromyalgia. 2. Chronic pain. 3. Acid reflux with known large hiatal hernia. 4. Headaches. 5. Recent urinary tract infection. 6. Depression.  PAST SURGICAL HISTORY:  Tubal ligation in the distant past, history of tonsillectomy, laparoscopic  cholecystectomy and intraoperative cholangiogram by Dr. Almond Lint on October 30, 2010.  ALLERGIES:  None known.  MEDICATIONS: 1. Temazepam. 2. Fluoxetine. 3. Oxycodone 30 mg p.o. q.6 h. p.r.n. 4. Lyrica 75 mg p.o. b.i.d. 5. Omeprazole. 6. Risperdal. 7. Flora-Q. 8. Macrobid. 9. Lidoderm patch.  SOCIAL HISTORY:  No tobacco, alcohol, or drug use.  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  Her husband is a Nutritional therapist.  They are all together.  She lives at home.  FAMILY HISTORY:  She cannot recall any major GI disorders or other problems.  Her son is otherwise healthy.  REVIEW OF SYSTEMS:  As noted per HPI.  She denies any fever, chills, sweats.  Her weight has been stable.  Eyes, ENT, cardiac, respiratory, otherwise negative.  No recent cold, coughs, or flus.  ABDOMEN:  As noted above.  GU:  No dysuria or hematuria or pyuria.  GYN:  No vaginal bleeding or  discharge.  MUSCULOSKELETAL:  She does have some chronic back pain and soreness and neck pain, but not in her shoulder, elbows, wrists, nor in her hips, knees, and ankles.  SKIN:  No new sores or lesions.  PSYCH:  She does have some chronic pain, anxiety, and depression issues, but these have been relatively stable according to she and her family.  HEME/LYMPH/ALLERGIC:  Otherwise negative.  PHYSICAL EXAMINATION:  VITAL SIGNS:  Temperature 97.5, pulse 110, respirations 18, blood pressure is 162/91, sats 94%, heart rates down in the 90s now after receiving a couple rounds of narcotic medicines. GENERAL:  She is a well-developed, well-nourished, obese female, lying in bed, uncomfortable, but nontoxic. PSYCH:  She is emotionally labile and anxious, but consolable.  No evidence of any dementia, delirium, psychosis, or paranoia. HEENT:  She is normocephalic.  Mucous membranes are dry, but nasopharynx, oropharynx are clear.  Eyes, pupils equal, round, and reactive to light.  Extraocular movements are intact.  Her sclerae are nonicteric nor are they injected. NECK:  Supple without any masses.  Trachea is midline. CHEST:  No pain on rib or sternal compression. BREASTS:  No sores or lesions or nipple discharge. HEART:  Regular rate and rhythm.  No murmurs, clicks, or rubs. CHEST:  Clear to auscultation bilaterally anteriorly. ABDOMEN:  Obese, but soft.  She has some periumbilical tenderness and some right upper quadrant tenderness, but no diffuse peritoneal sign. Left upper quadrant, left lower quadrant, and right lower quadrant are not particularly uncomfortable. GENITAL:  No inguinal hernias.  No vaginal bleeding or discharge. RECTAL:  Deferred. SKIN:  Some mild ecchymosis around her laparoscopic port sites, especially around the umbilicus, but within a centimeter or two, not severe.  No other petechiae, purpura.  No other sores or lesions. LYMPH:  No head, neck, axillary, or groin  lymphadenopathy.  LABORATORY DATA:  White count is 15.1, which is elevated from this morning.  Sodium 129.  Liver function tests, AST, ALT, and alk phos are above normal, but decreased from yesterday.  Her total bilirubin was 2, which is increased from yesterday.  Three-view x-rays show no free air and obvious large hiatal hernia, no bowel obstruction.  ASSESSMENT AND PLAN:  A 65 year old female with a lot of chronic pain issues on high-dose pain medications with increased postoperative pain and some nausea and persistent transaminitis although inconclusive in its trend. 1. Admit.  She initially hesitated but is now convinced. 2. IV fluids. 3. Clears only for right now. 4. Control nausea more aggressively tonight and then regroup. 5.  Increase pain control.  We will try since she is not on renal     failure and I think we can try some IV Toradol 1 or 2 doses to see     if this helps, but avoid overdoing  with her history of hiatal     hernia and keep her on a PPI. 6. Repeat GI labs.  If her bilirubin or other labs continue to     increase, then have Gastroenterology evaluate to see if she would     benefit from MRCP or ERCP evaluation, or CT scan.  The trend has improved     and often bilirubin lags behind the other liver function tests in resolution. 7. Although she has increased white count, I think it is secondary to     pain and stress.  Like to hold any antibiotics right now since I do     not have any proof of infection and see what the trend shows.  If     it starts to increase, then I would do a more broad culture workup     besides the urinary tract infection.  I see no evidence of any     wound infection or abscess.  I see no reason to do an ultrasound or     a CT scan at this point as long as she improves clinically and her labs improve,     but we will have to follow her closely. 8. __________ 9. Watch for insomnia.     Ardeth Sportsman, MD     SCG/MEDQ  D:   10/31/2010  T:  11/01/2010  Job:  [id number]  Electronically Signed by Karie Soda MD on 11/16/2010 12:40:01 PM

## 2010-11-18 NOTE — Consult Note (Addendum)
NAME:  [name], [name]                ACCOUNT NO.:  [id number]  MEDICAL RECORD NO.:  [id number]           PATIENT TYPE:  I  LOCATION:  1405                         FACILITY:  [facility]  PHYSICIAN:  Parthenia Tellefsen C. Irvin Lizama, MD, FACCDATE OF BIRTH:  Aug 25, 1946  DATE OF CONSULTATION:  11/12/2010 DATE OF DISCHARGE:                                CONSULTATION   PRIMARY CARDIOLOGIST:  New, the patient has never seen a cardiologist.  PRIMARY MEDICAL DOCTOR:  Lenon Curt. Chilton Si, M.D.  CHIEF COMPLAINT:  Elevated liver tests.  REASON FOR CONSULTATION:  Atrial tachycardia.  HISTORY OF PRESENT ILLNESS:  Sarah Miranda is a 65 year old female with no prior cardiac history with a history of fibromyalgia and GERD who presented to a primary care provider approximately 2 weeks ago for med refill visit .  At that time, she had routine labs drawn, which showed elevated LFTs with a total bilirubin of 1.5, alk phos of 180, and AST and ALT in the 800s.  She was sent for a gallbladder ultrasound, which revealed cholelithiasis with possible common bile duct dilatation, could not exclude distal common bile duct.  The patient was sent to the ED for further evaluation and possible lap chole.  This was performed on October 30, 2010.  However, the patient clinically deteriorated with problems with pain and nausea, was brought back to the OR on November 01, 2010, for exploration and subsequent clipping of a bile leak. Peritoneal drain was placed at that time.  Peritoneal cultures revealed Klebsiella pneumoniae peritonitis.  Postoperatively, she developed respiratory failure and was intubated with septic shock, requiring pressors as well.  She had ERCP on November 03, 2010, in which a biliary stent was placed and no leak was seen.  She was started on TNA, she had an adequate p.o. intake while on the ventilator.  Vancomycin and Zosyn were initiated on March 2nd.  Her ventilator was weaned and she was extubated on November 08, 2010.  On  March 7th, she developed diarrhea, but had negative C difficile.  She had problems with hypokalemia while here in the hospital was then repleted, and potassium is 3.8 today.  We are asked to see the patient for tachycardia.  Telemetry reveals a 15 beat run of atrial tachycardia approximately 160 beats per minute with no apparent P wave.  Initial EKG showed sinus tachycardia with a rate of 134 beats per minute with RSR pattern in V1 without any acute changes.  PAST MEDICAL HISTORY: 1. Fibromyalgia. 2. GERD. 3. Back pain.  No prior cardiac workup except for EKG.  PAST SURGICAL HISTORY:  Tubal ligation, tonsillectomy.  MEDICATIONS: 1. Questran 4 g p.o. q.24 h. 2. Clinimix 1000 mL IV q.1800. 3. Pepcid 20 mg p.o. b.i.d. 4. Fat emulsion 250 mL IV q.1800. 5. Prozac 40 mg daily. 6. Heparin 5000 units q.8 h. 7. Sliding scale insulin. 8. Lopressor 12.5 mg p.o. q.6 h. 9. Zosyn 3.375 g IV q.8 h. 10.Potassium chloride 40 mEq daily. 11.Lyrica 75 mg daily. 12.Risperidone 1 mg p.o. b.i.d. 13.Vancomycin.  ALLERGIES:  No known drug allergies.  SOCIAL HISTORY:  The patient lives in [place].  She has 2 sons and 1 daughter, who live in [place], [place], and [place]. She denies any tobacco, alcohol use.  FAMILY HISTORY:  Mother died at 22 of Alzheimer's.  Father died at age 1, she is unsure of the history.  Her siblings are living and healthy. She has no known family history of coronary artery disease.  REVIEW OF SYSTEMS:  Positive for fever several days ago, but currently afebrile.  Negative for nausea, vomiting at present.  She denies any chest pain, shortness of breath.  She is aware of palpitations at times, but was unaware of that particular set at 5 o'clock this morning.  All other systems reviewed and otherwise negative.  LABORATORY DATA:  WBC 8.3, hemoglobin 9.1, hematocrit 28.5, platelet count 542.  Sodium 139, potassium 3.9, chloride 108, CO2 ius 26, glucose 98, BUN 11,  creatinine 0.67.  LFTs within normal limits with exception of decreased total protein 4.8, albumin 1.9.  Cardiac enzymes negative x1.  TSH within normal limits on November 02, 2010.  BNP 168 on admission.  Abscess culture, February 26th, showed Klebsiella pneumoniae, magnesium of 2.3.  RADIOLOGY: 1. Chest x-ray showed right mild pulmonary vascular congestion,     bilateral perihilar and lower lobe opacities and small effusions.     No change. 2. CT abdomen and pelvis on November 07, 2010, demonstrated fair amount of     stable free fluid in the pelvis and the mesenteric fluid in the     abdomen has decreased slightly in the interval, although appear     more organized on previous study.  Circumferential Remmy Crass thickening     in the right colon extending in the proximal transverse segment.     Mild small bowel distention, left abdomen, maybe related to ileus.  PHYSICAL EXAMINATION:  VITAL SIGNS:  Temperature 99.2, pulse 114, respirations 16, blood pressure 143/92, pulse ox 94% on room air. GENERAL:  This is a pleasant white female in no acute distress. HEENT:  Normocephalic, atraumatic with extraocular movements intact. Clear sclerae . NECK:  Supple without carotid bruit. HEART:  Auscultation reveals tachycardic rate, regular rhythm with S1, S2 without murmurs, rubs, or gallops. LUNGS:  Decreased breath sounds at the bases, but otherwise clear. ABDOMEN:  Soft, nontender, nondistended.  Positive bowel sounds. EXTREMITIES:  Warm, dry without clubbing or cyanosis.  She has trace sock line edema. NEUROLOGIC:  She is alert and oriented.  Responds to questions appropriately with normal affect.  ASSESSMENT/PLAN:  The patient was seen and examined by Dr. Daleen Squibb and myself.  This is a 65 year old female with no prior cardiac history who was admitted with cholelithiasis, status post lap cholecystectomy on October 30, 2010, complicated by septic shock and Klebsiella pneumoniae peritonitis with drain  placement, biliary stent placement.  She remains off pressors and extubated and is continued on her antibiotics.  She had a brief episode of atrial tachycardia this morning approximately 5 o'clock in the morning with 15 beats of supraventricular tachycardia with a rate slightly above 150.  There are no apparent P waves.  At this time, it is felt to be a supraventricular tachycardia, rather than atrial fibrillation/atrial flutter, but we will continue to monitor.  We agree with continuing on her beta-blocker at present dosing and will wait to see what effect it has on her pulse and blood pressure.  We would consider up titrating it to keep her resting heart rate in this current state less than 80 in light of her acute illness, pending stable  blood pressures.  We will check a 2-D echocardiogram to rule out structural heart disease.  Thank you for the opportunity to participate in the care of this patient.     Dayna Dunn, P.A.C.   ______________________________ Jesse Sans Daleen Squibb, MD, [facility]    DD/MEDQ  D:  11/12/2010  T:  11/13/2010  Job:  [id number]  cc:   Lenon Curt. Chilton Si, M.D. Fax: [phone]  Electronically Signed by Ronie Spies  on 11/18/2010 10:15:18 PM Electronically Signed by Valera Castle MD [facility] on 11/20/2010 09:18:32 AM

## 2010-11-20 NOTE — Discharge Summary (Signed)
NAME:  Sarah Miranda, Sarah Miranda                ACCOUNT NO.:  [id number]  MEDICAL RECORD NO.:  [id number]           PATIENT TYPE:  I  LOCATION:  1405                         FACILITY:  [facility]  PHYSICIAN:  Rayven Hendrickson A. Carrington Olazabal, M.D.DATE OF BIRTH:  1946-04-17  DATE OF ADMISSION:  10/31/2010 DATE OF DISCHARGE:  11/16/2010                              DISCHARGE SUMMARY   ADMITTING PHYSICIAN:  Ardeth Sportsman, MD  DISCHARGING PHYSICIAN:  Clovis Pu. Shivali Quackenbush, MD  CONSULTANTS: 1. Iva Boop, MD, Clementeen Graham with GI. 2. Jesse Sans. Daleen Squibb, MD, [facility] with Cardiology.  PRIMARY CARE PHYSICIAN:  Lenon Curt. Chilton Si, MD  PROCEDURES: 1. Laparoscopic evaluation of gallbladder bed with clipping of     accessory bile duct in the gallbladder bed and reclipping of the     cystic duct with irrigation of the abdominal cavity and placement     of a 19-French Blake drain by Dr. Daphine Deutscher on November 01, 2010. 2. ERCP with sphincterotomy and stent placement on November 03, 2010,    by Dr. Stan Head.  REASON FOR ADMISSION:  [name] is a 65 year old female who initially presented to the Emergency Department on October 30, 2010, with incidental findings of elevated liver function tests on her annual lab work the day prior.  It was felt that at that time that she was having issues with cholelithiasis.  She was sent to the Emergency Department, where she was admitted and a laparoscopic cholecystectomy was completed. The following day, the patient was found okay in the morning.  She was having some pain and was offered to stay 1 more day; however, she wanted to get home.  When she got home, she began having a significant flare-up of abdominal pain that continued to worsen.  She presented back to the emergency department, where she was admitted for pain control.  Please see admitting history and physical for further details.  ADMITTING DIAGNOSES: 1. Postoperative pain from laparoscopic cholecystectomy the day prior. 2. Persistent  transaminitis. 3. Fibromyalgia. 4. Chronic pain. 5. Depression.  HOSPITAL COURSE:  This time, the patient was admitted.  The following day, the patient continued to have significant pain.  Gastroenterology was asked to evaluate the patient for concern for a retained common bile duct stone.  Later in the day, the patient became hypotensive and tachycardic.  She was given Invanz and a CT scan was ordered.  The CT scan revealed postoperative changes from cholecystectomy with a small-to- moderate amount of free fluid in the right abdomen and pelvis that was greater than expected and a postoperative bile leak could not be excluded.  Given these findings, it was determined that at that time the patient needed an exploratory laparoscopy.  At this time, there were found to be duct of Luschka that was draining bile.  Therefore, this was clipped and a 19-French Blake drain was placed.  The patient was then taken to the ICU postoperatively as she remained on a ventilator.  It was felt at this time the patient would still require an ERCP. Therefore, this was completed on February 28.  No leak was seen at this time  and a stent was placed.  The patient remained intubated for several days postoperatively.  She was placed on TNA for nutritional support while intubated.  The patient was finally able to be extubated on postoperative day #7.  At this time, PT/OT was consulted.  Over the next several days, the patient began to clinically improve.  Her diet was advanced as tolerated and her TNA was weaned after her diet was tolerated.  By postoperative day 14, the patient was tolerating a regular diet.  Her abdomen was soft and essentially nontender.  Her JP drain had been removed the day prior, as it was not revealing any evidence of bile and had minimal output.  At this time surgically, the patient was felt stable for discharge home.  Later in the hospitalization, the patient did have some episodes  of supraventricular tachycardia.  Cardiology was asked to evaluate the patient, at which time they put her on metoprolol.  The patient is otherwise stable.  She will be discharged home with home health PT and OT per their recommendations.  DISCHARGE DIAGNOSES: 1. Status post laparoscopic cholecystectomy. 2. Bile leak. 3. Status post diagnostic laparoscopy with clipping of a duct of     Luschka. 4. Status post endoscopic retrograde cholangiopancreatography with     stent placement. 5. Ventilator-dependent respiratory failure, improved. 6. Sepsis, improved. 7. Protein-calorie malnutrition, requiring TNA, improved. 8. Deconditioning, improving. 9. Fibromyalgia. 10.Chronic pain. 11.Supraventricular tachycardia.  DISCHARGE MEDICATIONS:  Please see medication reconciliation form.  DISCHARGE INSTRUCTIONS:  The patient may increase her activity slowly and walk up steps.  She may shower.  She may not do any heavy lifting for the next 2 weeks.  She is not to drive for the next 1 week.  She is to resume a low-fat diet.  She is to follow up with Dr. Almond Lint in our office in 2 weeks.  She is to follow up with Dr. Stan Head in mid- April-to-late April.     Letha Cape, PA   ______________________________ Clovis Pu Zeyna Mkrtchyan, M.D.    KEO/MEDQ  D:  11/16/2010  T:  11/16/2010  Job:  [id number]  cc:   Iva Boop, MD,FACG [facility] [address] [place], [place] [address]  Lenon Curt. Chilton Si, M.D. Fax: [phone]  Almond Lint, MD [address] [place] [address]  Jesse Sans. Wall, MD, FACC [address]  [address] [place] [address]  Electronically Signed by Barnetta Chapel PA on 11/18/2010 01:38:59 PM Electronically Signed by Harriette Bouillon M.D. on 11/20/2010 09:20:15 AM

## 2010-11-23 ENCOUNTER — Telehealth: Payer: Self-pay | Admitting: Internal Medicine

## 2010-11-23 ENCOUNTER — Encounter: Payer: Self-pay | Admitting: Internal Medicine

## 2010-12-03 NOTE — Progress Notes (Signed)
Summary: Appt questions  Phone Note Call from Patient Call back at [phone]   Caller: Patient Call For: Dr. Leone Payor Reason for Call: Talk to Nurse Summary of Call: patient had gallbaldder surgery a little while ago and needs her stint removed but does not want to go back to the doctor who did her surgery, wants to know if this  is something our office could do for her Initial call taken by: [place] Johnson,  November 23, 2010 9:55 AM  Follow-up for Phone Call        Dr Leone Payor, patient had ERCP on 11/03/10 and per your note, ERCP in 1 month to arrange for stent removal in 3 months. Does this mean I schedule her for ERCP at [facility] around 12/02/10? Thanks. Graciella Freer RN  November 23, 2010 12:09 PM   Additional Follow-up for Phone Call Additional follow up Details #1::        I believe we had been thinking to have her come into the office in late April for me to check on her and explain things given how sick she was - and then would make an appointment for stent removal likely in May Additional Follow-up by: Iva Boop MD, Clementeen Graham,  November 23, 2010 1:00 PM    Additional Follow-up for Phone Call Additional follow up Details #2::    When I called patient back she stated someone called to tell her she has an appointment this Friday, November 27, 2010 with Dr Daphine Deutscher to remove the "thing"- does she needs to see [place]? Explained to patient per Dr Leone Payor, Dr Daphine Deutscher will take out patient's external drain- Dr Leone Payor will remove the internal drain. Patient stated understanding and was given an appointment on 12/28/10 @3 :45pm. Follow-up by: Graciella Freer RN,  November 23, 2010 1:54 PM

## 2010-12-03 NOTE — Letter (Signed)
Summary: Appt Reminder 2  [facility]  [address] [place], [place] [address]   Phone: [phone]  Fax: [phone]        November 23, 2010 MRN: [id number]    [facility] [address] [place], [place]  [address]    Dear Ms. Fearn,   You have a return appointment with Dr. Leone Payor on Monday, April 23,2012  at 3:45pm.  Please remember to bring a complete list of the medicines you are taking, your insurance card and your co-pay.  If you have to cancel or reschedule this appointment, please call before 5:00 pm the evening before to avoid a cancellation fee.  If you have any questions or concerns, please call [phone].    Sincerely,    Graciella Freer RN

## 2010-12-28 ENCOUNTER — Encounter: Payer: Self-pay | Admitting: Internal Medicine

## 2010-12-28 ENCOUNTER — Ambulatory Visit (INDEPENDENT_AMBULATORY_CARE_PROVIDER_SITE_OTHER): Payer: Medicare Other | Admitting: Internal Medicine

## 2010-12-28 VITALS — BP 110/84 | HR 93 | Ht 64.0 in | Wt 153.0 lb

## 2010-12-28 DIAGNOSIS — K838 Other specified diseases of biliary tract: Secondary | ICD-10-CM | POA: Insufficient documentation

## 2010-12-28 DIAGNOSIS — K929 Disease of digestive system, unspecified: Secondary | ICD-10-CM

## 2010-12-28 NOTE — Patient Instructions (Signed)
We will call you to arrange for your ERCP to remove the stent that was placed in February. You should hear from [place] in the next couple of days. If you do not, call us.

## 2010-12-28 NOTE — Assessment & Plan Note (Signed)
Appears recovered. Will need ERCP and stent removal in next month or so. Will arrange she would prefer a Monday or Tuesday.

## 2010-12-28 NOTE — Progress Notes (Signed)
HPI:  65 yo ww s/p biliary stent placement for bile leak. ERCP on 11/03/10. Not having any problems now. Was quite ill with SIRS and respiratory failure on ventilator.  Past Medical History  Diagnosis Date  . Acid reflux   . Depression   . Hyperthyroidism   . Insomnia   . Fibromyalgia   . Anxiety    Past Surgical History  Procedure Date  . Tonsillectomy   . Laparoscopic tubal ligation   . Cholecystectomy 10/2010    complicated by bile leak  . Ercp 11/03/2010    for bile leak    reports that she has never smoked. She has never used smokeless tobacco. She reports that she does not drink alcohol or use illicit drugs. family history is negative for Colon cancer. No Known Allergies    Current outpatient prescriptions:esomeprazole (NEXIUM) 40 MG capsule, Take 40 mg by mouth daily before breakfast.  , Disp: , Rfl: ;  FLUoxetine (PROZAC) 40 MG capsule, Take 40 mg by mouth daily.  , Disp: , Rfl: ;  levothyroxine (SYNTHROID, LEVOTHROID) 25 MCG tablet, Take 25 mcg by mouth daily.  , Disp: , Rfl: ;  oxycodone (ROXICODONE) 30 MG immediate release tablet, Take 30 mg by mouth every 4 (four) hours as needed.  , Disp: , Rfl:  pregabalin (LYRICA) 75 MG capsule, Take 75 mg by mouth 2 (two) times daily.  , Disp: , Rfl: ;  risperiDONE (RISPERDAL) 1 MG tablet, Take 1 mg by mouth 2 (two) times daily.  , Disp: , Rfl: ;  temazepam (RESTORIL) 30 MG capsule, Take 30 mg by mouth at bedtime as needed.  , Disp: , Rfl:

## 2010-12-30 ENCOUNTER — Telehealth: Payer: Self-pay | Admitting: Internal Medicine

## 2010-12-30 DIAGNOSIS — K838 Other specified diseases of biliary tract: Secondary | ICD-10-CM

## 2010-12-30 DIAGNOSIS — K839 Disease of biliary tract, unspecified: Secondary | ICD-10-CM

## 2010-12-30 NOTE — Telephone Encounter (Signed)
?    May 2 around 1 PM - do not think she needs propofol  ? May 10 middayish (late AM, early PM ok)  Diagnosis is bile leak (for stent removal)

## 2010-12-30 NOTE — Telephone Encounter (Signed)
Left message for patient to call back  

## 2010-12-30 NOTE — Telephone Encounter (Signed)
Patient and husband are very anxious about stent removal and ERCP.  All questions answered. Dr Leone Payor Please advise other dates or do you want it to be your next hospital week?

## 2010-12-31 NOTE — Telephone Encounter (Signed)
Patient is scheduled for ERCP for stent removal at [facility] 01/14/11 12:00.  Husband is advised of all instructions.

## 2011-01-04 NOTE — Telephone Encounter (Signed)
Addended by: Darcey Nora on: 01/04/2011 02:05 PM   Modules accepted: Orders

## 2011-01-04 NOTE — Telephone Encounter (Signed)
I explained to husband about the need for stent removal. She is very anxious about this given the complications she had. I made it clear that we cannot leave the stent in.  He understands. Will have her get an abdominal film (KUB) 2 days before the ERCP to see if stent still in place - rarely they pass on their own and if so would not need the ERCP. My RN to schedule that.

## 2011-01-04 NOTE — Telephone Encounter (Signed)
Patient's husband is advised to bring the patient anytime on 01/12/11 for KUB.  He verbalized understanding.

## 2011-01-12 ENCOUNTER — Ambulatory Visit (INDEPENDENT_AMBULATORY_CARE_PROVIDER_SITE_OTHER)
Admission: RE | Admit: 2011-01-12 | Discharge: 2011-01-12 | Disposition: A | Discharge status: Home / Self Care | Payer: Medicare Other | Source: Ambulatory Visit | Attending: Internal Medicine | Admitting: Internal Medicine

## 2011-01-12 DIAGNOSIS — K929 Disease of digestive system, unspecified: Secondary | ICD-10-CM

## 2011-01-12 DIAGNOSIS — K838 Other specified diseases of biliary tract: Secondary | ICD-10-CM

## 2011-01-13 NOTE — Progress Notes (Signed)
Quick Note:  Stent still there ERCP removal tomorrow as planned ______

## 2011-01-14 ENCOUNTER — Ambulatory Visit (HOSPITAL_COMMUNITY)
Admission: RE | Admit: 2011-01-14 | Discharge: 2011-01-14 | Disposition: A | Discharge status: Home / Self Care | Payer: Medicare Other | Source: Ambulatory Visit | Attending: Internal Medicine | Admitting: Internal Medicine

## 2011-01-14 ENCOUNTER — Other Ambulatory Visit: Payer: Self-pay | Admitting: Internal Medicine

## 2011-01-14 ENCOUNTER — Encounter: Payer: Medicare Other | Admitting: Internal Medicine

## 2011-01-14 DIAGNOSIS — K929 Disease of digestive system, unspecified: Secondary | ICD-10-CM | POA: Insufficient documentation

## 2011-01-14 DIAGNOSIS — Y836 Removal of other organ (partial) (total) as the cause of abnormal reaction of the patient, or of later complication, without mention of misadventure at the time of the procedure: Secondary | ICD-10-CM | POA: Insufficient documentation

## 2011-01-14 DIAGNOSIS — K838 Other specified diseases of biliary tract: Secondary | ICD-10-CM | POA: Insufficient documentation

## 2011-01-14 NOTE — Progress Notes (Signed)
This encounter was created in error - please disregard.

## 2011-01-19 NOTE — Procedures (Signed)
EEG:  06-792   CLINICAL HISTORY:  A 65 year old woman experiencing syncopal episodes  for 6 weeks.  She has not had any in the last 2 weeks.  The study is  being done to look for the presence of seizures (780.2)   PROCEDURE:  The tracing is carried out on a 32-channel digital Cadwell  recorder reformatted into 16 channel montages with one devoted to EKG.  The patient was awake and drowsy during the recording.   MEDICATIONS:  Oxycodone, tizanidine, torsemide, Lyrica, omeprazole,  risperidone, fluoxetine, and temazepam.   DESCRIPTION OF FINDINGS:  Dominant frequency is a 9-Hz 15 microvolt  activity that is well regulated.  Dysrhythmic theta and polymorphic  delta range activity was seen over the left hemisphere and on several  occasions in a generalized fashion.  The waking background returned.  Hyperventilation was carried out and caused no significant change in  background.  Photic stimulation induced driving response at 5, 7, 9, and  11 Hz.  EKG showed a regular sinus rhythm with ventricular response of  72 beats per minute.   IMPRESSION:  Abnormal EEG on the basis of intermittent left-sided  slowing, which suggests the presence of underlying structural and/or  vascular abnormality.  Generalized slowing can be associated with  diffuse neuronal dysfunction on a primary degenerative basis secondary  to variety of toxic or metabolic etiologies or could just represent a  drowsy state.  No seizure activity was seen in the record.  No  significant cardiac arrhythmia was seen.      Deanna Artis. Sharene Skeans, M.D.  Electronically Signed     XBM:[id number]  D:  03/13/2009 20:00:37  T:  03/14/2009 03:47:52  Job #:  [id number]   cc:   Lenon Curt. Chilton Si, M.D.  Fax: [phone]

## 2011-01-27 ENCOUNTER — Telehealth: Payer: Self-pay | Admitting: Internal Medicine

## 2011-01-27 NOTE — Telephone Encounter (Signed)
This note should have been taken on the patient's husband Aubryn Spinola

## 2011-04-02 ENCOUNTER — Encounter (INDEPENDENT_AMBULATORY_CARE_PROVIDER_SITE_OTHER): Payer: Self-pay | Admitting: Surgery

## 2011-04-02 ENCOUNTER — Ambulatory Visit (INDEPENDENT_AMBULATORY_CARE_PROVIDER_SITE_OTHER): Payer: Medicare Other | Admitting: Surgery

## 2011-04-02 DIAGNOSIS — Z9089 Acquired absence of other organs: Secondary | ICD-10-CM

## 2011-04-02 DIAGNOSIS — K929 Disease of digestive system, unspecified: Secondary | ICD-10-CM

## 2011-04-02 DIAGNOSIS — Z9049 Acquired absence of other specified parts of digestive tract: Secondary | ICD-10-CM

## 2011-04-02 DIAGNOSIS — K838 Other specified diseases of biliary tract: Secondary | ICD-10-CM

## 2011-04-02 NOTE — Progress Notes (Signed)
Sarah Miranda came in today to discuss her previous surgery gone bad back in February. Sarah Miranda had a laparoscopic cholecystectomy by Sarah Miranda and I sent her home the following morning. She returns through the ER with what proved to be bile peritonitis. She was frustrated because she didn't know what happened to her and watch events ascitic and was in fact in the ICU for over a week. I discussed her situation which was an ectopic bowel duct of Luschka.  I explained how these don't present at the time of the initial surgery the leak postop. They were frustrated and angry and offered to get her to see Sarah Miranda but I think they did not wish to do that. After the explanation and we discussed this for wall they felt much better and can hopefully bring closure to this complication.  Mr. Sarah Miranda indicated that he felt pressure didn't have the surgery before they could do there research on it could be her surgeon of choice. He indicated that when he had to have bilateral hip surgeries he went to the [facility] and [facility] for the sports medicine orthopedist who had done Sarah Miranda.  Mainly listen to their frustrations which understand and hopefully will help him again to bring closure to this.  As an aside Mr. Sarah Miranda has a left inguinal hernia. He asked me to examine him and I did appear ER he has an appointment to see me in August and we will keep that appointment. In the meantime I explained an opening repair with mesh to him. He can do that at term day surgery as he does not [place] long. Will see him back again and will hopefully schedule him for an open inguinal herniorrhaphy.

## 2011-12-15 ENCOUNTER — Other Ambulatory Visit: Payer: Self-pay | Admitting: Internal Medicine

## 2011-12-15 DIAGNOSIS — Z1231 Encounter for screening mammogram for malignant neoplasm of breast: Secondary | ICD-10-CM

## 2012-01-12 ENCOUNTER — Ambulatory Visit (HOSPITAL_COMMUNITY): Payer: Medicare Other

## 2012-02-09 ENCOUNTER — Ambulatory Visit (HOSPITAL_COMMUNITY): Payer: Medicare Other

## 2012-02-29 ENCOUNTER — Ambulatory Visit (HOSPITAL_COMMUNITY)
Admission: RE | Admit: 2012-02-29 | Discharge: 2012-02-29 | Disposition: A | Discharge status: Home / Self Care | Payer: Medicare Other | Source: Ambulatory Visit | Attending: Internal Medicine | Admitting: Internal Medicine

## 2012-02-29 DIAGNOSIS — Z1231 Encounter for screening mammogram for malignant neoplasm of breast: Secondary | ICD-10-CM | POA: Insufficient documentation

## 2012-12-11 ENCOUNTER — Other Ambulatory Visit: Payer: Self-pay | Admitting: *Deleted

## 2012-12-11 MED ORDER — OXYCODONE HCL 30 MG PO TABS: ORAL_TABLET | ORAL | Status: DC

## 2013-01-08 ENCOUNTER — Other Ambulatory Visit: Payer: Self-pay | Admitting: Geriatric Medicine

## 2013-01-10 ENCOUNTER — Other Ambulatory Visit: Payer: Self-pay | Admitting: Geriatric Medicine

## 2013-01-10 MED ORDER — OXYCODONE HCL 30 MG PO TABS: ORAL_TABLET | ORAL | Status: DC

## 2013-01-11 ENCOUNTER — Other Ambulatory Visit: Payer: Self-pay | Admitting: *Deleted

## 2013-01-11 DIAGNOSIS — E039 Hypothyroidism, unspecified: Secondary | ICD-10-CM

## 2013-01-11 DIAGNOSIS — I10 Essential (primary) hypertension: Secondary | ICD-10-CM

## 2013-02-06 ENCOUNTER — Other Ambulatory Visit: Payer: Self-pay | Admitting: Internal Medicine

## 2013-02-06 DIAGNOSIS — Z1231 Encounter for screening mammogram for malignant neoplasm of breast: Secondary | ICD-10-CM

## 2013-02-07 ENCOUNTER — Other Ambulatory Visit: Payer: Self-pay | Admitting: *Deleted

## 2013-02-07 MED ORDER — OXYCODONE HCL 30 MG PO TABS: ORAL_TABLET | ORAL | Status: DC

## 2013-03-02 ENCOUNTER — Ambulatory Visit (HOSPITAL_COMMUNITY)
Admission: RE | Admit: 2013-03-02 | Discharge: 2013-03-02 | Disposition: A | Discharge status: Home / Self Care | Payer: Medicare Other | Source: Ambulatory Visit | Attending: Internal Medicine | Admitting: Internal Medicine

## 2013-03-02 DIAGNOSIS — Z1231 Encounter for screening mammogram for malignant neoplasm of breast: Secondary | ICD-10-CM

## 2013-03-05 ENCOUNTER — Other Ambulatory Visit: Payer: Medicare Other

## 2013-03-05 DIAGNOSIS — I10 Essential (primary) hypertension: Secondary | ICD-10-CM

## 2013-03-05 DIAGNOSIS — E039 Hypothyroidism, unspecified: Secondary | ICD-10-CM

## 2013-03-06 LAB — COMPREHENSIVE METABOLIC PANEL
ALT: 6 IU/L (ref 0–32)
AST: 9 IU/L (ref 0–40)
Albumin/Globulin Ratio: 2.2 (ref 1.1–2.5)
Alkaline Phosphatase: 62 IU/L (ref 39–117)
BUN/Creatinine Ratio: 8 — ABNORMAL LOW (ref 11–26)
Calcium: 8.7 mg/dL (ref 8.6–10.2)
Creatinine, Ser: 0.88 mg/dL (ref 0.57–1.00)
GFR calc Af Amer: 79 mL/min/{1.73_m2} (ref >59)
GFR calc non Af Amer: 68 mL/min/{1.73_m2} (ref >59)
Globulin, Total: 1.7 g/dL (ref 1.5–4.5)
Potassium: 4.6 mmol/L (ref 3.5–5.2)
Sodium: 132 mmol/L — ABNORMAL LOW (ref 134–144)
Total Bilirubin: 0.2 mg/dL (ref 0.0–1.2)

## 2013-03-06 LAB — LIPID PANEL
Chol/HDL Ratio: 2.9 ratio units (ref 0.0–4.4)
Triglycerides: 103 mg/dL (ref 0–149)

## 2013-03-06 LAB — TSH: TSH: 4.54 u[IU]/mL — ABNORMAL HIGH (ref 0.450–4.500)

## 2013-03-07 ENCOUNTER — Other Ambulatory Visit: Payer: Self-pay | Admitting: Internal Medicine

## 2013-03-08 ENCOUNTER — Other Ambulatory Visit: Payer: Self-pay | Admitting: Geriatric Medicine

## 2013-03-08 MED ORDER — OXYCODONE HCL 30 MG PO TABS: ORAL_TABLET | ORAL | Status: DC

## 2013-03-13 ENCOUNTER — Encounter: Payer: Self-pay | Admitting: *Deleted

## 2013-03-14 ENCOUNTER — Encounter: Payer: Self-pay | Admitting: Internal Medicine

## 2013-03-14 ENCOUNTER — Ambulatory Visit (INDEPENDENT_AMBULATORY_CARE_PROVIDER_SITE_OTHER): Payer: Medicare Other | Admitting: Internal Medicine

## 2013-03-14 VITALS — BP 130/82 | HR 114 | Temp 98.5°F | Resp 18 | Ht 64.17 in | Wt 165.6 lb

## 2013-03-14 DIAGNOSIS — F319 Bipolar disorder, unspecified: Secondary | ICD-10-CM

## 2013-03-14 DIAGNOSIS — M797 Fibromyalgia: Secondary | ICD-10-CM

## 2013-03-14 DIAGNOSIS — F329 Major depressive disorder, single episode, unspecified: Secondary | ICD-10-CM

## 2013-03-14 DIAGNOSIS — R7989 Other specified abnormal findings of blood chemistry: Secondary | ICD-10-CM

## 2013-03-14 DIAGNOSIS — G4700 Insomnia, unspecified: Secondary | ICD-10-CM | POA: Insufficient documentation

## 2013-03-14 DIAGNOSIS — E039 Hypothyroidism, unspecified: Secondary | ICD-10-CM

## 2013-03-14 DIAGNOSIS — I10 Essential (primary) hypertension: Secondary | ICD-10-CM

## 2013-03-14 DIAGNOSIS — R609 Edema, unspecified: Secondary | ICD-10-CM | POA: Insufficient documentation

## 2013-03-14 DIAGNOSIS — F411 Generalized anxiety disorder: Secondary | ICD-10-CM

## 2013-03-14 DIAGNOSIS — R5381 Other malaise: Secondary | ICD-10-CM

## 2013-03-14 DIAGNOSIS — F32A Depression, unspecified: Secondary | ICD-10-CM

## 2013-03-14 DIAGNOSIS — R109 Unspecified abdominal pain: Secondary | ICD-10-CM

## 2013-03-14 DIAGNOSIS — IMO0001 Reserved for inherently not codable concepts without codable children: Secondary | ICD-10-CM

## 2013-03-14 DIAGNOSIS — R739 Hyperglycemia, unspecified: Secondary | ICD-10-CM | POA: Insufficient documentation

## 2013-03-14 DIAGNOSIS — F419 Anxiety disorder, unspecified: Secondary | ICD-10-CM | POA: Insufficient documentation

## 2013-03-14 NOTE — Progress Notes (Signed)
Date: 03/14/2013  MRN:  [id number] Name:  Sarah Miranda Sex:  female Age:  67 y.o. DOB:1946-04-24   [facility] #:   [id number]                    Level Of Care: Independent Provider: Murray Hodgkins, M.D.  Emergency Contacts: Contact Information   Name Relation Home Work Mobile   Cribb,Bill Spouse [phone]  [phone]   Linus Salmons [phone]  [phone]      Code Status: MOST Form:  Allergies:No Known Allergies   Chief Complaint  Patient presents with  . Annual Exam     HPI: Patient has some lower abdominal discomfort. She has discomfort with intercourse, but she and her husband do not engage frequently because of his impotency. She denies discomfort at other times. There is no discomfort with bowel movements. She denies dysuria.  Depression: She feels this is controlled on current medications. She has her usual dysphoria and ill feelings as well as the pains associated with fibromyalgia.  Insomnia: Improved  Fibromyalgia : Unchanged. This has been present at least 15 years.  Anxiety: Chronic. Unchanged.  Unspecified hypothyroidism: Stable  Other abnormal blood chemistry: History of elevated blood sugars. Most recent value was normal.  Other malaise and fatigue: Chronic and likely associated with her fibromyalgia.  Bipolar disorder, unspecified: Tolerating Risperdal. She asked if I knew about lawsuits being filed related to this drug. Patient believes that her emotional state has been improved since being on this drug. She has had no observable side effects.  Edema: Improved  Unspecified essential hypertension: Controlled     Past Medical History  Diagnosis Date  . Depression   . Hyperthyroidism   . Insomnia   . Fibromyalgia   . Anxiety   . Alopecia, unspecified   . Unspecified hypothyroidism   . Other abnormal blood chemistry     hyperglycemia  . Nonspecific elevation of levels of transaminase or lactic acid dehydrogenase (LDH)   . Tachycardia,  unspecified   . Dysuria   . Pathologic fracture of vertebrae   . Other malaise and fatigue   . Syncope and collapse   . Bipolar disorder, unspecified   . Anemia, unspecified   . Encounter for long-term (current) use of other medications   . Edema   . Lumbago   . Reflux esophagitis   . Abnormal involuntary movements(781.0)   . Unspecified essential hypertension   . Esophageal reflux     Past Surgical History  Procedure Laterality Date  . Tonsillectomy    . Laparoscopic tubal ligation    . Cholecystectomy  10/2010    complicated by bile leak  . Ercp  11/03/2010    for bile leak     Procedures: 1995-MRI Cervical Spine: Unremarkable 1998-Skull X-Ray: Negative 02/09/2002-Endoscopy: Moderate sized reducible hiatal hernia, associated with gross gastroesophageal reflux. There is no evidence of stricture or esophagitis. 10/31/2002-EKG: Normal sinus rhythm 11/02/2002-Cervical Spine: Vertebral heights and disc spaces are well maintained apart from minimal disc  space narrowing at C5-6. Small ununited osteophyte at the C5-6. Neural foramina patent apart from minimal encroachment on the right at C3-4 related to facet degenerative changes. Minimal retrolisthesis of C5 on C6 with extension which essentially resolves on flexion and neutral position. The odontoid is intact. The lateral masses are well aligned. 2004 Colonoscopy by Dr. Mechele Collin in [place], [place] 11/02/2002-MRI Brain:Few scattered punctate and patchy foci of increased T2 signal intensity within the cerebral white matter bilaterally, likely reflecting sequela of small vessel disease.  no mass lesion, mass effect or midline shift. The ventricular system and cortical sulci are normal in size and appearance in this 67 year old patient. Diffusion weighted images demonstrate no evidence for acute infarct. Midline structures are normal. Craniovertebral junction is normal; specifically, no evidence for chiari malformation. Tiny retention cyst right  maxillary antrum. Minimal fluid in the right mastoid air cells. 11/05/2002-EEG: Normal 11/06/2002-MRI Cervical Spine: well maintained intervertebral disc spaces and vertebral body heights. An incidental vertebral hemangioma is present at T1. No signal abnormalities are demonstrated within the cervical cord. C2-C3: Unremarkable. C3-C4: There is a minimal disc bulge. A mild uncovertebral joint hypertrophy is present with slight left sided neural foraminal encroachment. C4-C5:Mild facet arthropathy is present in the left side. There is associated minimal left sided neural foraminal encroachment, otherwise unremarkable. C5-C6: Very minimal disc bulge is present, otherwise unremarkable. C6-C7 Mild facet arthropathy is present, otherwise unremarkable. C7-T1: Unremarkable. T1-T2 Unremarkable  03/03/2009-CT Head: No acute findings. Small vessel ischemic disease and brain atrophy. Chronic sinus inflammation. 03/05/09 Holter: normal 03/17/2009-CT Lumbar Spine: Age indeterminate L2 compression fracture as described; this appears chronic, although could be subacute. There is mild osseous retropulsion. Scout images show probable mild superior endplate compression deformities at T11 and T12 as well. Relatively mild spondylosis with mild disc bulging and facet disease as described. No significant spinal stenosis or nerve root encroachment is seen. 04/03/2009-Lumbar Spine:Stable vertebral body wedge compression fracture deformities at T12 and L2. Degenerative disc disease at L1-2 and L2-3. No acute findings 04/23/2009-Nuclear Whole Body Bone Scan: Osteoblastic activity noted T12 and L2, consistent with healing compression fractures. Unremarkable otherwise except for degenerative changes. 04/23/2010-Mammogram: Negative 10/29/2010 Abdominal Ultrasound- Multiple gallstones. Slight prominence of common bile duct. Cannot exclude distal common bile duct.  10/30/2010 Intraoperative Cholangiogram  Filling defect in the distal common bile  duct concerning for partially occlusive distal CBD stone  10/31/2010 Chest X-Ray Large Hiatal hernia, atelectasis in both lower lobes. 11/01/2010 CT of abdomen and pelvis -Postop changes from cholecystectomy with small to moderate amount free fluid in the upper right abdomen and pelvis and to lesser degree in the left upper quadrant  11/03/2010 ERCP with sphincterotomy and stent placement Dr Stan Head  11/07/2010 CT of the Abdomen- Interval placement of a surgical drain and common duct stent. Fairly stable amount of free fluid in the abdomen has decreased slightly in the interval although the collections in the abdomen appear more organized than on the previous study. Given the increasing organization, evolving abscess formation is a possibility. Circumferential wall thickening in the right colon extending into the proximal transverse segment. No etiology for the colonic wall thickening is evident. The changes in the right colon are nonobstructing as oral contrast is migrated distally into the left colon. Mild small bowel distention in the left abdomen may be related to ileus. no frank transition zone is seen in the small bowel to suggest mechanical obstruction although there is some small bowel wall thickening in the right abdomen, in the region of the abnormal appearing colon. These changes may all be secondary. 11/11/2010-Chest X-Ray: No significant interval change.  02/06/2013 mammogram: Normal  Consultants: [facility] Neurologist-Dr. Sharene Skeans Dr Stan Head- Gastroenterologist  Dr. Donell Beers:-  General Surgeon Dr. Martin-:General Surgeon Dr. Linus Mako- Pulmonary  Dr.  Valera Castle- Cardiologist    Current Outpatient Prescriptions  Medication Sig Dispense Refill  . esomeprazole (NEXIUM) 40 MG capsule Take 40 mg by mouth daily before breakfast.        .  FLUoxetine (PROZAC) 40 MG capsule Take 40 mg by mouth daily.        Marland Kitchen levothyroxine (SYNTHROID, LEVOTHROID) 25 MCG  tablet TAKE 1 TABLET EVERY DAY FOR THYROID SUPPLEMENT  50 tablet  5  . oxycodone (ROXICODONE) 30 MG immediate release tablet Take one tablet every 3 hours as needed for pain.  240 tablet  0  . pregabalin (LYRICA) 75 MG capsule Take 75 mg by mouth 2 (two) times daily.        . risperiDONE (RISPERDAL) 1 MG tablet Take 1 mg by mouth 2 (two) times daily.              There is no immunization history on file for this patient.   Diet: regular  History  Substance Use Topics  . Smoking status: Never Smoker   . Smokeless tobacco: Never Used  . Alcohol Use: No      Father deceased with congestive heart failure and prostate cancer Mother deceased at age 71 of congestive heart failure 4 siblings: Reino Bellis 2 children: Vance Gather   Review of Systems DATA OBTAINED: from patient, medical record GENERAL: Feels well   No fevers, change in appetite. Weight has been increasing. She has chronic fatigue. SKIN: No itch, rash or open wounds EYES: No eye pain, dryness or itching  No change in vision EARS: No earache, tinnitus, change in hearing NOSE: No congestion, drainage or bleeding MOUTH/THROAT: No mouth or tooth pain  No sore throat No difficulty chewing or swallowing RESPIRATORY: No cough, wheezing, SOB CARDIAC: No chest pain, palpitations  No edema. CHEST/BREASTS: No discomfort, discharge or lumps in breasts GI: No abdominal pain  No N/V/D or constipation. History of heartburn. Well controlled on Nexium. GU: No dysuria, frequency or urgency  No change in urine volume or character No nocturia or change in stream   MUSCULOSKELETAL:  multiple uncomfortable joints that are not inflamed or swollen. Chronic soft tissue pains in multiple areas. Mildly unsteady gait. Chronic backache. No recent falls   NEUROLOGIC: No dizziness, fainting, headache, imbalance, numbness  No change in mental status.  PSYCHIATRIC: history of depression, anxiety, and probable manic  depression. Has improved on Risperdal. She still occasionally gets increased nervousness or restless feelings. There are some compulsive behaviors. She has a history of claustrophobia.    Vital signs: BP 130/82  Pulse 114  Temp(Src) 98.5 F (36.9 C) (Oral)  Resp 18  Ht 5' 4.17" (1.63 m)  Wt 165 lb 9.6 oz (75.116 kg)  BMI 28.27 kg/m2  SpO2 97%  GENERAL APPEARANCE: No acute distress, appropriately groomed,  Moderately overweight  body habitus. Alert, pleasant, conversant. HEAD: Normocephalic, atraumatic EYES: Conjunctiva/lids clear. Pupils round, reactive. EOMs intact.  EARS: External exam WNL, canals clear, TM WNL. Hearing grossly normal. NOSE: No deformity or discharge. MOUTH/THROAT: Lips w/o lesions. Oral mucosa, tongue moist, w/o lesion. Oropharynx w/o redness or lesions.  NECK: Supple, full ROM. No thyroid tenderness, enlargement or nodule LYMPHATICS: No head, neck or supraclavicular adenopathy RESPIRATORY: Breathing is even, unlabored. Lung sounds are clear and full.  CARDIOVASCULAR: Heart RRR. Mild tachycardia is present. No murmur or extra heart sounds  ARTERIAL: No carotid, aortic or femoral bruit. Carotid, Femoral, Popliteal, DP,PT pulse 2+.  VENOUS: No varicosities. No venous stasis skin changes  EDEMA: No peripheral or periorbital edema. No ascites GASTROINTESTINAL: Abdomen is soft, non-tender, not distended w/ normal bowel sounds. No hepatic or splenic enlargement. No mass, ventral or inguinal hernia.  RECTAL: No anal  fissure, skin tag or external hemorrhoid. Sphincter tone WNL. Stool is brown, heme negative. GENITOURINARY: Bladder non tender, not distended.  FEMALE: No urethral discharge, vulva lesion, vaginal bleeding, or mass. No cystocele, no rectocele. Pap smear was done today. No palpable uterine enlargement it is flexed anteriorly. No adnexal masses.  MUSCULOSKELETAL: Moves all extremities with full ROM, strength and tone. Back is without kyphosis, scoliosis.. Gait is  steady.she does have some discomfort in the low back area as well as in the right sacroiliac area.  NEUROLOGIC: Oriented to time, place, person. Cranial nerves 2-12 grossly intact, speech clear, no tremor. Patella, brachial DTR 2+. PSYCHIATRIC: Mood and affect appropriate to situation   Screening Score  MMS    PHQ2 0  PHQ9     Fall Risk    BIMS    Lab reports 10/28/2010 CBC Wbc 6.7 Rbc 5.12 Hgb 15.1, Hct 45.1 CMP Glucose 110 Bun 10 Creatinine 0.83 Sodium 130 AST(SGOT) 860 ALT(SGPT) 308657  TSH 1.830  11/07/2010 [facility] labs CBC: Rbc 3.08, Hgb 8.9, Hct 27.9 glucose 72, BUN 8, Creatinine 0.63, Alkaline Phosphatase 29,  12/01/2010 CBC: Hgb 11.4 CMP: glucose 101, BUN 10, Creatinine 0.97, Albumin 3.4 Sed Rate 30 TSH 4.980 8/20/2012CBC: Wbc 5.3. Rbc 4.17, Hemoglobin 11.4 CMP: Glucose 96, BUN 14, Creatinine 0.81, Potassium 5.4, Chloride 96 TSH: 2.690 10/13/2011  CMP: glucose 86, BUN 10, Creatinine 0.91 A1C: 5.4 Lipid: Cholesterol 199, Triglycerides 89, HDL 69, LDL 112 TSH: 2.550 02/16/2012  CBC: wbc 6.0, rbc 4.32, Hemoglobin 12.2 CMP: glucose 96, BUN 10, Creatinine 0.82 Lipid; Cholesterol 194, Triglycerides 75, HDL 69, LDL 110 TSH; 2.000   Appointment on 03/05/2013  Component Date Value Range Status  . TSH 03/05/2013 4.540* 0.450 - 4.500 uIU/mL Final  . COMMENT 03/05/2013 Comment   Final   Comment: The Endocrine Society recommends against routine treatment for                          patients with elevated TSH levels below 10.000 uIU/mL if free T4 or                          T4 are normal.  Thyroid function should be monitored at 6 to 12                          month intervals. Women who are pregnant or hope to become pregnant                          in the near future deserve special consideration.  . Cholesterol, Total 03/05/2013 185  100 - 199 mg/dL Final  . Triglycerides 03/05/2013 103  0 - 149 mg/dL Final  . HDL 84/69/6295 63  >39 mg/dL Final   Comment:  According to ATP-III Guidelines, HDL-C >59 mg/dL is considered a                          negative risk factor for CHD.  Marland Kitchen VLDL Cholesterol Cal 03/05/2013 21  5 - 40 mg/dL Final  . LDL Calculated 03/05/2013 284* 0 - 99 mg/dL Final  . Chol/HDL Ratio 03/05/2013 2.9  0.0 - 4.4 ratio units Final  . Glucose 03/05/2013 85  65 - 99 mg/dL Final  . BUN 13/24/4010 7* 8 - 27 mg/dL Final  . Creatinine, Ser  03/05/2013 0.88  0.57 - 1.00 mg/dL Final  . GFR calc non Af Amer 03/05/2013 68  >59 mL/min/1.73 Final  . GFR calc Af Amer 03/05/2013 79  >59 mL/min/1.73 Final  . BUN/Creatinine Ratio 03/05/2013 8* 11 - 26 Final  . Sodium 03/05/2013 132* 134 - 144 mmol/L Final  . Potassium 03/05/2013 4.6  3.5 - 5.2 mmol/L Final  . Chloride 03/05/2013 96* 97 - 108 mmol/L Final  . CO2 03/05/2013 23  18 - 29 mmol/L Final  . Calcium 03/05/2013 8.7  8.6 - 10.2 mg/dL Final  . Total Protein 03/05/2013 5.5* 6.0 - 8.5 g/dL Final  . Albumin 08/65/7846 3.8  3.6 - 4.8 g/dL Final  . Globulin, Total 03/05/2013 1.7  1.5 - 4.5 g/dL Final  . Albumin/Globulin Ratio 03/05/2013 2.2  1.1 - 2.5 Final  . Total Bilirubin 03/05/2013 0.2  0.0 - 1.2 mg/dL Final  . Alkaline Phosphatase 03/05/2013 62  39 - 117 IU/L Final  . AST 03/05/2013 9  0 - 40 IU/L Final  . ALT 03/05/2013 6  0 - 32 IU/L Final     Annual summary: Hospitalizations: None since 2012 Infection History: None of significance Functional assessment: Independent in all ADL Areas of potential improvement: Functioning at highest achievable level Rehabilitation Potential: Not pertinent Prognosis for survival: Good  Plan: Depression: Reasonable control on current medications  Insomnia: Improved  Fibromyalgia: Stable. Continues to have diffuse soft tissue pains.  Anxiety: Stable  Unspecified hypothyroidism: Stable  Other abnormal blood chemistry: Most recent value normal  Other malaise and fatigue: Mild improvement  Bipolar disorder, unspecified: Improved on  Risperdal. I asked her to continue this drug.  Edema: Improved  Unspecified essential hypertension: Controlled  Abdominal  pain, other specified site -etiology is not clearly established. Patient requested Pap smear this was completed today. I was unable to find any focal discomfort or abdominal masses  Plan: PAP, IG w/ Reflex HPV when ASCU [[facility], [facility]]

## 2013-03-14 NOTE — Patient Instructions (Signed)
Continue current medications. 

## 2013-03-14 NOTE — Progress Notes (Signed)
  Subjective:    Patient ID: Sarah Miranda, female    DOB: [birth year], 67 y.o.   MRN: [id number]  HPI Some pain in the lower abd. Uncomfortable if she has intercourse.    Review of Systems     Objective:   Physical Exam        Assessment & Plan:

## 2013-03-17 LAB — PAP IG W/ RFLX HPV ASCU

## 2013-03-19 NOTE — Progress Notes (Signed)
Patient aware.

## 2013-03-30 ENCOUNTER — Other Ambulatory Visit: Payer: Self-pay | Admitting: Internal Medicine

## 2013-04-06 ENCOUNTER — Other Ambulatory Visit: Payer: Self-pay | Admitting: Geriatric Medicine

## 2013-04-06 MED ORDER — OXYCODONE HCL 30 MG PO TABS: ORAL_TABLET | ORAL | Status: DC

## 2013-05-04 ENCOUNTER — Other Ambulatory Visit: Payer: Self-pay | Admitting: *Deleted

## 2013-05-04 MED ORDER — OXYCODONE HCL 30 MG PO TABS: ORAL_TABLET | ORAL | Status: DC

## 2013-05-17 ENCOUNTER — Other Ambulatory Visit: Payer: Self-pay | Admitting: Internal Medicine

## 2013-06-01 ENCOUNTER — Other Ambulatory Visit: Payer: Self-pay | Admitting: *Deleted

## 2013-06-01 MED ORDER — OXYCODONE HCL 30 MG PO TABS: ORAL_TABLET | ORAL | Status: DC

## 2013-06-07 ENCOUNTER — Other Ambulatory Visit: Payer: Self-pay | Admitting: Internal Medicine

## 2013-06-29 ENCOUNTER — Other Ambulatory Visit: Payer: Self-pay | Admitting: *Deleted

## 2013-06-29 MED ORDER — OXYCODONE HCL 30 MG PO TABS: ORAL_TABLET | ORAL | Status: DC

## 2013-07-09 ENCOUNTER — Other Ambulatory Visit: Payer: Self-pay | Admitting: Internal Medicine

## 2013-07-11 ENCOUNTER — Other Ambulatory Visit: Payer: Self-pay | Admitting: Internal Medicine

## 2013-07-18 ENCOUNTER — Ambulatory Visit (INDEPENDENT_AMBULATORY_CARE_PROVIDER_SITE_OTHER): Payer: Medicare Other | Admitting: Internal Medicine

## 2013-07-18 ENCOUNTER — Encounter: Payer: Self-pay | Admitting: Internal Medicine

## 2013-07-18 VITALS — BP 98/68 | HR 115 | Temp 98.2°F | Resp 18 | Ht 64.0 in | Wt 166.2 lb

## 2013-07-18 DIAGNOSIS — R609 Edema, unspecified: Secondary | ICD-10-CM

## 2013-07-18 DIAGNOSIS — E039 Hypothyroidism, unspecified: Secondary | ICD-10-CM

## 2013-07-18 DIAGNOSIS — M797 Fibromyalgia: Secondary | ICD-10-CM

## 2013-07-18 DIAGNOSIS — F319 Bipolar disorder, unspecified: Secondary | ICD-10-CM

## 2013-07-18 DIAGNOSIS — F419 Anxiety disorder, unspecified: Secondary | ICD-10-CM

## 2013-07-18 DIAGNOSIS — F411 Generalized anxiety disorder: Secondary | ICD-10-CM

## 2013-07-18 DIAGNOSIS — I10 Essential (primary) hypertension: Secondary | ICD-10-CM

## 2013-07-18 DIAGNOSIS — IMO0001 Reserved for inherently not codable concepts without codable children: Secondary | ICD-10-CM

## 2013-07-18 NOTE — Progress Notes (Signed)
Subjective:    Patient ID: Sarah Miranda, female    DOB: 30-Aug-1946, 67 y.o.   MRN: [id number]  Chief Complaint  Patient presents with  . Follow-up    HPI Feeling well for the last week.  Unspecified essential hypertension : off all meds. BP on the low side today.  Anxiety: chronic.  Bipolar disorder, unspecified; controlled with medication  Edema: resolved  Fibromyalgia: chronic and unchanged  Unspecified hypothyroidism: supplemented - Plan: TSH    Current Outpatient Prescriptions on File Prior to Visit  Medication Sig Dispense Refill  . FLUoxetine (PROZAC) 40 MG capsule TAKE ONE CAPSULE EVERY DAY TO HELP NERVES AND DEPRESSION  30 capsule  4  . levothyroxine (SYNTHROID, LEVOTHROID) 25 MCG tablet TAKE 1 TABLET EVERY DAY FOR THYROID SUPPLEMENT  50 tablet  5  . LYRICA 75 MG capsule TAKE 1 CAPSULE TWICE A DAILY TO HELP WITH PAIN  60 capsule  0  . NEXIUM 40 MG capsule TAKE ONE CAPSULE BY MOUTH DAILY TO REDUCE STOMACH ACID  30 capsule  5  . oxycodone (ROXICODONE) 30 MG immediate release tablet Take one tablet every 3 hours as needed for pain.  240 tablet  0  . risperiDONE (RISPERDAL) 1 MG tablet TAKE 1 TABLET BY MOUTH TWICE A DAY TO HELP RELAX  60 tablet  2   No current facility-administered medications on file prior to visit.    Review of Systems  Constitutional: Positive for activity change (sedentary.), appetite change and fatigue. Negative for fever, diaphoresis and unexpected weight change.  HENT: Negative.   Eyes: Negative.   Respiratory: Negative for cough, chest tightness, shortness of breath and wheezing.   Cardiovascular: Negative for chest pain, palpitations and leg swelling.  Gastrointestinal: Negative for nausea, abdominal pain, diarrhea, constipation and abdominal distention.  Endocrine: Negative.   Genitourinary: Negative.   Musculoskeletal:       Multiple uncomfortable joints. No inflammation or joint swelling.  Skin: Negative.   Neurological: Positive for  weakness (generalized).  Psychiatric/Behavioral: Positive for sleep disturbance. Negative for suicidal ideas and hallucinations. The patient is nervous/anxious. The patient is not hyperactive.        Objective:BP 98/68  Pulse 115  Temp(Src) 98.2 F (36.8 C) (Oral)  Resp 18  Ht 5\' 4"  (1.626 m)  Wt 166 lb 3.2 oz (75.388 kg)  BMI 28.51 kg/m2  SpO2 96%    Physical Exam  Constitutional: She is oriented to person, place, and time. She appears well-developed and well-nourished. No distress.  Moderately overweight  HENT:  Head: Normocephalic and atraumatic.  Right Ear: External ear normal.  Left Ear: External ear normal.  Nose: Nose normal.  Mouth/Throat: Oropharynx is clear and moist.  Eyes: Conjunctivae and EOM are normal. Pupils are equal, round, and reactive to light.  Neck: No JVD present. No tracheal deviation present. No thyromegaly present.  Cardiovascular: Regular rhythm, normal heart sounds and intact distal pulses.  Exam reveals no gallop and no friction rub.   No murmur heard. Mild tachcardia.  Pulmonary/Chest: No respiratory distress. She has no wheezes. She has no rales. She exhibits no tenderness.  Abdominal: She exhibits no distension and no mass. There is no tenderness.  Musculoskeletal: Normal range of motion. She exhibits no edema and no tenderness.  Lymphadenopathy:    She has no cervical adenopathy.  Neurological: She is alert and oriented to person, place, and time. She has normal reflexes. No cranial nerve deficit. Coordination normal.  Skin: No rash noted. No erythema. No pallor.  Psychiatric:  She has a normal mood and affect. Her behavior is normal. Judgment and thought content normal.   Repeat BP 102/60    No visits with results within 3 Month(s) from this visit. Latest known visit with results is:  Office Visit on 03/14/2013  Component Date Value Range Status  . DIAGNOSIS: 03/14/2013 Comment   Final   NEGATIVE FOR INTRAEPITHELIAL LESION AND  MALIGNANCY.  Marland Kitchen Specimen adequacy: 03/14/2013 Comment   Final   Comment: Satisfactory for evaluation. Endocervical and/or squamous metaplastic                          cells (endocervical component) are present.  Marland Kitchen PATH REPORT.FINAL DX [facility] 03/14/2013 Comment   Final   789.09 ; Abdominal pain, other specified site  . Performed by: 03/14/2013 Comment   Final   Namon Cirri, Cytotechnologist (ASCP)  . PAP SMEAR COMMENT 03/14/2013 .   Final  . Note: 03/14/2013 Comment   Final   Comment: The Pap smear is a screening test designed to aid in the detection of                          premalignant and malignant conditions of the uterine cervix.  It is not a                          diagnostic procedure and should not be used as the sole means of detecting                          cervical cancer.  Both false-positive and false-negative reports do occur.  . Test Methodology 03/14/2013 Comment   Final   Comment: This liquid based ThinPrep(R) pap test was screened with the                          use of an image guided system.  Marland Kitchen PAP REFLEX: 03/14/2013 Comment   Final   Comment: The HPV DNA reflex criteria were not met with this specimen result                          therefore, no HPV testing was performed.       Assessment & Plan:  Unspecified essential hypertension :off all meds  - Plan: CMP  Anxiety: controlled  Bipolar disorder, unspecified: controlled on current meds  Edema: resolved  Fibromyalgia: unchanged  Unspecified hypothyroidism: supplemented   - Plan: TSH

## 2013-07-18 NOTE — Patient Instructions (Signed)
Continue current medications. 

## 2013-07-24 ENCOUNTER — Other Ambulatory Visit: Payer: Self-pay | Admitting: *Deleted

## 2013-07-25 ENCOUNTER — Other Ambulatory Visit: Payer: Self-pay | Admitting: *Deleted

## 2013-07-25 MED ORDER — OXYCODONE HCL 30 MG PO TABS: ORAL_TABLET | ORAL | Status: DC

## 2013-07-25 NOTE — Telephone Encounter (Signed)
Son called and stated that he is taking his mother out of town for a surprise for her birthday to see her other son that just had a baby and needs to go ahead and get her Rx.

## 2013-07-31 ENCOUNTER — Encounter: Payer: Self-pay | Admitting: Internal Medicine

## 2013-08-23 ENCOUNTER — Other Ambulatory Visit: Payer: Self-pay | Admitting: *Deleted

## 2013-08-23 MED ORDER — OXYCODONE HCL 30 MG PO TABS: ORAL_TABLET | ORAL | Status: DC

## 2013-09-20 ENCOUNTER — Other Ambulatory Visit: Payer: Self-pay | Admitting: *Deleted

## 2013-09-20 MED ORDER — OXYCODONE HCL 30 MG PO TABS: ORAL_TABLET | ORAL | Status: DC

## 2013-10-05 ENCOUNTER — Other Ambulatory Visit: Payer: Self-pay | Admitting: Internal Medicine

## 2013-10-11 ENCOUNTER — Other Ambulatory Visit: Payer: Self-pay | Admitting: Internal Medicine

## 2013-10-12 ENCOUNTER — Encounter: Payer: Self-pay | Admitting: *Deleted

## 2013-10-12 NOTE — Telephone Encounter (Signed)
Re print RX 

## 2013-10-18 ENCOUNTER — Other Ambulatory Visit: Payer: Self-pay | Admitting: *Deleted

## 2013-10-18 MED ORDER — OXYCODONE HCL 30 MG PO TABS: ORAL_TABLET | ORAL | Status: DC

## 2013-11-14 ENCOUNTER — Other Ambulatory Visit: Payer: Self-pay | Admitting: *Deleted

## 2013-11-14 MED ORDER — OXYCODONE HCL 30 MG PO TABS: ORAL_TABLET | ORAL | Status: DC

## 2013-12-12 ENCOUNTER — Other Ambulatory Visit: Payer: Self-pay | Admitting: *Deleted

## 2013-12-12 MED ORDER — OXYCODONE HCL 30 MG PO TABS: ORAL_TABLET | ORAL | Status: DC

## 2013-12-14 ENCOUNTER — Other Ambulatory Visit: Payer: Self-pay | Admitting: Internal Medicine

## 2013-12-24 ENCOUNTER — Telehealth: Payer: Self-pay | Admitting: *Deleted

## 2013-12-24 NOTE — Telephone Encounter (Signed)
It wll be ready tomorrow, 12/25/13.

## 2013-12-24 NOTE — Telephone Encounter (Signed)
Patient is calling wanting to know the status of her jury Duty letter. Patient dropped off papers last week and states that she needs it to mail back. Please Advise.

## 2013-12-25 ENCOUNTER — Encounter: Payer: Self-pay | Admitting: Internal Medicine

## 2013-12-25 NOTE — Telephone Encounter (Signed)
Letter Completed and patient Notified and will pick up

## 2014-01-09 ENCOUNTER — Other Ambulatory Visit: Payer: Self-pay | Admitting: Internal Medicine

## 2014-01-09 DIAGNOSIS — M797 Fibromyalgia: Secondary | ICD-10-CM

## 2014-01-09 MED ORDER — OXYCODONE HCL 30 MG PO TABS: ORAL_TABLET | ORAL | Status: DC

## 2014-01-11 ENCOUNTER — Telehealth: Payer: Self-pay | Admitting: *Deleted

## 2014-01-11 NOTE — Telephone Encounter (Signed)
Patient called and stated that she has a sore throat,congestion and earache. No available appointment for patient to be seen. Advised patient to go to Urgent Care to be evaluated and she agreed

## 2014-01-14 ENCOUNTER — Other Ambulatory Visit: Payer: Medicare Other

## 2014-01-14 DIAGNOSIS — I10 Essential (primary) hypertension: Secondary | ICD-10-CM

## 2014-01-14 DIAGNOSIS — E039 Hypothyroidism, unspecified: Secondary | ICD-10-CM

## 2014-01-15 LAB — COMPREHENSIVE METABOLIC PANEL
A/G RATIO: 2.1 (ref 1.1–2.5)
ALT: 6 IU/L (ref 0–32)
AST: 10 IU/L (ref 0–40)
Albumin: 3.9 g/dL (ref 3.6–4.8)
Alkaline Phosphatase: 59 IU/L (ref 39–117)
BUN/Creatinine Ratio: 12 (ref 11–26)
BUN: 8 mg/dL (ref 8–27)
CHLORIDE: 93 mmol/L — AB (ref 97–108)
CO2: 21 mmol/L (ref 18–29)
Calcium: 8.8 mg/dL (ref 8.7–10.3)
Creatinine, Ser: 0.68 mg/dL (ref 0.57–1.00)
GFR calc Af Amer: 104 mL/min/{1.73_m2} (ref >59)
GFR, EST NON AFRICAN AMERICAN: 90 mL/min/{1.73_m2} (ref >59)
GLUCOSE: 94 mg/dL (ref 65–99)
Globulin, Total: 1.9 g/dL (ref 1.5–4.5)
POTASSIUM: 4.5 mmol/L (ref 3.5–5.2)
SODIUM: 131 mmol/L — AB (ref 134–144)
TOTAL PROTEIN: 5.8 g/dL — AB (ref 6.0–8.5)
Total Bilirubin: 0.3 mg/dL (ref 0.0–1.2)

## 2014-01-15 LAB — TSH: TSH: 2.95 u[IU]/mL (ref 0.450–4.500)

## 2014-01-16 ENCOUNTER — Encounter: Payer: Self-pay | Admitting: Internal Medicine

## 2014-01-16 ENCOUNTER — Ambulatory Visit (INDEPENDENT_AMBULATORY_CARE_PROVIDER_SITE_OTHER): Payer: Medicare Other | Admitting: Internal Medicine

## 2014-01-16 VITALS — BP 120/70 | HR 54 | Temp 97.6°F | Resp 18 | Ht 64.0 in | Wt 153.4 lb

## 2014-01-16 DIAGNOSIS — R7989 Other specified abnormal findings of blood chemistry: Secondary | ICD-10-CM

## 2014-01-16 DIAGNOSIS — M797 Fibromyalgia: Secondary | ICD-10-CM

## 2014-01-16 DIAGNOSIS — IMO0001 Reserved for inherently not codable concepts without codable children: Secondary | ICD-10-CM

## 2014-01-16 DIAGNOSIS — F319 Bipolar disorder, unspecified: Secondary | ICD-10-CM

## 2014-01-16 DIAGNOSIS — H9209 Otalgia, unspecified ear: Secondary | ICD-10-CM | POA: Insufficient documentation

## 2014-01-16 DIAGNOSIS — I10 Essential (primary) hypertension: Secondary | ICD-10-CM

## 2014-01-16 DIAGNOSIS — E039 Hypothyroidism, unspecified: Secondary | ICD-10-CM

## 2014-01-16 MED ORDER — ANTIPYRINE-BENZOCAINE 5.4-1.4 % OT SOLN: OTIC | Status: DC

## 2014-01-16 MED ORDER — CETIRIZINE-PSEUDOEPHEDRINE ER 5-120 MG PO TB12: ORAL_TABLET | ORAL | Status: DC

## 2014-01-16 MED ORDER — CIPROFLOXACIN HCL 500 MG PO TABS: ORAL_TABLET | ORAL | Status: DC

## 2014-01-16 NOTE — Patient Instructions (Signed)
Continue current medications. 

## 2014-01-16 NOTE — Progress Notes (Addendum)
Patient ID: [name], female   DOB: 12/12/1945, 68 y.o.   MRN: [id number]    Location:  [facility]   Place of Service: OFFICE    No Known Allergies  Chief Complaint  Patient presents with  . Follow-up    HPI:  Started with respiratory illness 01/11/14. Left otalgia to the point she kept a heating pad on the left ear.Marland Kitchen. Pharyngitis. Cough. Chelise Hanger sputum and rattling. Mild nausea and anorexia. Unable to say if she had fever. Had some bleeding from the left EAC after cleaning with towel  Unspecified essential hypertension - controlled  Fibromyalgia: unchanged  Bipolar disorder, unspecified: stable  Other abnormal blood chemistry: noirmal glucose  Unspecified hypothyroidism - compensated    Medications: Patient's Medications  New Prescriptions   No medications on file  Previous Medications   FLUOXETINE (PROZAC) 40 MG CAPSULE    TAKE ONE CAPSULE EVERY DAY TO HELP NERVES AND DEPRESSION   LEVOTHYROXINE (SYNTHROID, LEVOTHROID) 25 MCG TABLET    TAKE 1 TABLET EVERY DAY FOR THYROID SUPPLEMENT   LYRICA 75 MG CAPSULE    TAKE 1 CAPSEULE TWICE DAILY TO HELP WITH PAIN   NEXIUM 40 MG CAPSULE    TAKE ONE CAPSULE BY MOUTH DAILY TO REDUCE STOMACH ACID   OXYCODONE (ROXICODONE) 30 MG IMMEDIATE RELEASE TABLET    Take one tablet every 3 hours as needed for pain.   RISPERIDONE (RISPERDAL) 1 MG TABLET    TAKE 1 TABLET BY MOUTH TWICE A DAY TO HELP RELAX  Modified Medications   No medications on file  Discontinued Medications   FLUZONE HIGH-DOSE INJECTION         Review of Systems  Constitutional: Positive for activity change (sedentary.), appetite change and fatigue. Negative for fever, diaphoresis and unexpected weight change.  HENT:       Left TM with blood on the surface and retracted. Abrasion on the left EAC.   Eyes: Negative.   Respiratory: Negative for cough, chest tightness, shortness of breath and wheezing.   Cardiovascular: Negative for chest pain, palpitations and leg swelling.    Gastrointestinal: Negative for nausea, abdominal pain, diarrhea, constipation and abdominal distention.  Endocrine: Negative.   Genitourinary: Negative.   Musculoskeletal:       Multiple uncomfortable joints. No inflammation or joint swelling.  Skin: Negative.   Neurological: Positive for weakness (generalized).  Psychiatric/Behavioral: Positive for sleep disturbance. Negative for suicidal ideas and hallucinations. The patient is nervous/anxious. The patient is not hyperactive.     Filed Vitals:   01/16/14 1235  BP: 120/70  Pulse: 54  Temp: 97.6 F (36.4 C)  TempSrc: Oral  Resp: 18  Height: 5\' 4"  (1.626 m)  Weight: 153 lb 6.4 oz (69.582 kg)  SpO2: 95%   Body mass index is 26.32 kg/(m^2).  Physical Exam  Constitutional: She is oriented to person, place, and time. She appears well-developed and well-nourished. No distress.  Moderately overweight  HENT:  Head: Normocephalic and atraumatic.  Right Ear: External ear normal.  Left Ear: External ear normal.  Nose: Nose normal.  Mouth/Throat: Oropharynx is clear and moist.  Eyes: Conjunctivae and EOM are normal. Pupils are equal, round, and reactive to light.  Neck: No JVD present. No tracheal deviation present. No thyromegaly present.  Cardiovascular: Regular rhythm, normal heart sounds and intact distal pulses.  Exam reveals no gallop and no friction rub.   No murmur heard. Mild tachcardia.  Pulmonary/Chest: No respiratory distress. She has no wheezes. She has no rales. She exhibits no  tenderness.  Abdominal: She exhibits no distension and no mass. There is no tenderness.  Musculoskeletal: Normal range of motion. She exhibits no edema and no tenderness.  Lymphadenopathy:    She has no cervical adenopathy.  Neurological: She is alert and oriented to person, place, and time. She has normal reflexes. No cranial nerve deficit. Coordination normal.  Skin: No rash noted. No erythema. No pallor.  Psychiatric: She has a normal mood  and affect. Her behavior is normal. Judgment and thought content normal.     Labs reviewed: Appointment on 01/14/2014  Component Date Value Ref Range Status  . TSH 01/14/2014 2.950  0.450 - 4.500 uIU/mL Final  . Glucose 01/14/2014 94  65 - 99 mg/dL Final  . BUN 54/09/811905/07/2014 8  8 - 27 mg/dL Final  . Creatinine, Ser 01/14/2014 0.68  0.57 - 1.00 mg/dL Final  . GFR calc non Af Amer 01/14/2014 90  >59 mL/min/1.73 Final  . GFR calc Af Amer 01/14/2014 104  >59 mL/min/1.73 Final  . BUN/Creatinine Ratio 01/14/2014 12  11 - 26 Final  . Sodium 01/14/2014 131* 134 - 144 mmol/L Final  . Potassium 01/14/2014 4.5  3.5 - 5.2 mmol/L Final  . Chloride 01/14/2014 93* 97 - 108 mmol/L Final  . CO2 01/14/2014 21  18 - 29 mmol/L Final  . Calcium 01/14/2014 8.8  8.7 - 10.3 mg/dL Final  . Total Protein 01/14/2014 5.8* 6.0 - 8.5 g/dL Final  . Albumin 14/78/295605/07/2014 3.9  3.6 - 4.8 g/dL Final  . Globulin, Total 01/14/2014 1.9  1.5 - 4.5 g/dL Final  . Albumin/Globulin Ratio 01/14/2014 2.1  1.1 - 2.5 Final  . Total Bilirubin 01/14/2014 0.3  0.0 - 1.2 mg/dL Final  . Alkaline Phosphatase 01/14/2014 59  39 - 117 IU/L Final  . AST 01/14/2014 10  0 - 40 IU/L Final  . ALT 01/14/2014 6  0 - 32 IU/L Final      Assessment/Plan  1. Otalgia - antipyrine-benzocaine (AURALGAN) otic solution; 2-3 drops in painful ear every 2 hours as needed  Dispense: 10 mL; Refill: 0 - ciprofloxacin (CIPRO) 500 MG tablet; One twice daily for infection  Dispense: 20 tablet; Refill: 0 - cetirizine-pseudoephedrine (ZYRTEC-D) 5-120 MG per tablet; One twice daily to help congestion  Dispense: 20 tablet; Refill: 0  2. Unspecified essential hypertension controlled - Comprehensive metabolic panel; Future  3. Fibromyalgia unchanged  4. Bipolar disorder, unspecified stable  5. Other abnormal blood chemistry Normal glucose  6. Unspecified hypothyroidism compensated - TSH; Future

## 2014-01-16 NOTE — Addendum Note (Signed)
Addended by: Kimber RelicGREEN, Romina Divirgilio G on: 01/16/2014 01:39 PM   Modules accepted: Orders

## 2014-01-21 ENCOUNTER — Telehealth: Payer: Self-pay

## 2014-01-21 NOTE — Telephone Encounter (Signed)
Patient with blackout spells since OV on 01/16/14, patient states this has happened several times through out the day. " I get up and everything goes black, next thing I know I am on the floor." I highly recommended patient be evaluated at the ER. Patient states she would rather see Dr.Green this week if possible. Patient denied chest pain, vomiting, or blurred vision. Patient refused to go to the ER after I recommended it several times.  I scheduled patient to see Dr.Green tomorrow at 3:00 pm, I also blocked a slot to allow enough time for patient to be thoroughly evaluated.  Message to be forwarded to Dr.Green as a [place]

## 2014-01-22 ENCOUNTER — Encounter: Payer: Self-pay | Admitting: Internal Medicine

## 2014-01-22 ENCOUNTER — Ambulatory Visit (INDEPENDENT_AMBULATORY_CARE_PROVIDER_SITE_OTHER): Payer: Medicare Other | Admitting: Internal Medicine

## 2014-01-22 VITALS — BP 114/64 | HR 76 | Temp 97.5°F

## 2014-01-22 DIAGNOSIS — E871 Hypo-osmolality and hyponatremia: Secondary | ICD-10-CM

## 2014-01-22 DIAGNOSIS — F319 Bipolar disorder, unspecified: Secondary | ICD-10-CM

## 2014-01-22 DIAGNOSIS — I10 Essential (primary) hypertension: Secondary | ICD-10-CM

## 2014-01-22 DIAGNOSIS — E039 Hypothyroidism, unspecified: Secondary | ICD-10-CM

## 2014-01-22 DIAGNOSIS — R55 Syncope and collapse: Secondary | ICD-10-CM

## 2014-01-22 DIAGNOSIS — R42 Dizziness and giddiness: Secondary | ICD-10-CM

## 2014-01-22 DIAGNOSIS — R609 Edema, unspecified: Secondary | ICD-10-CM

## 2014-01-22 NOTE — Patient Instructions (Signed)
Drink 1/2 jug of Gatorade daily.

## 2014-01-30 ENCOUNTER — Ambulatory Visit: Payer: Medicare Other | Admitting: Internal Medicine

## 2014-02-04 NOTE — Progress Notes (Signed)
Patient ID: Sarah Miranda, female   DOB: Aug 16, 1946, 68 y.o.   MRN: [id number]    Location:  [facility]  Place of Service: OFFICE   No Known Allergies  Chief Complaint  Patient presents with  . Acute Visit    weakness & dizziness when getting up or fast movements x 2-3 days, refused to go to the ER when advised.    HPI:  Hyponatremia - patient has not been eating or drinking well. She has had a syncopal episode.  Dizziness - worse when getting up from lying down or seated position  Syncope - patient apparently had a gray out spell as opposed to true blackout. She did not totally lose consciousness but felt quite woozy.  Unspecified hypothyroidism: Compensation  Unspecified essential hypertension: Now running slightly low. I suspect this is related to poor oral intake.  Edema: 1+ bipedal. Unchanged.  Bipolar disorder, unspecified: She continues to have episodes mainly of depression. There are no manic episodes. She is currently taking Prozac and risperidone to deal with this disorder. She believes the struts have helped her, but she remains symptomatic.     Medications: Patient's Medications  New Prescriptions   No medications on file  Previous Medications   ANTIPYRINE-BENZOCAINE (AURALGAN) OTIC SOLUTION    2-3 drops in painful ear every 2 hours as needed   CETIRIZINE-PSEUDOEPHEDRINE (ZYRTEC-D) 5-120 MG PER TABLET    One twice daily to help congestion   CIPROFLOXACIN (CIPRO) 500 MG TABLET    One twice daily for infection   FLUOXETINE (PROZAC) 40 MG CAPSULE    TAKE ONE CAPSULE EVERY DAY TO HELP NERVES AND DEPRESSION   LEVOTHYROXINE (SYNTHROID, LEVOTHROID) 25 MCG TABLET    TAKE 1 TABLET EVERY DAY FOR THYROID SUPPLEMENT   LYRICA 75 MG CAPSULE    TAKE 1 CAPSEULE TWICE DAILY TO HELP WITH PAIN   NEXIUM 40 MG CAPSULE    TAKE ONE CAPSULE BY MOUTH DAILY TO REDUCE STOMACH ACID   OXYCODONE (ROXICODONE) 30 MG IMMEDIATE RELEASE TABLET    Take one tablet every 3 hours as needed for pain.   RISPERIDONE (RISPERDAL) 1 MG TABLET    TAKE 1 TABLET BY MOUTH TWICE A DAY TO HELP RELAX  Modified Medications   No medications on file  Discontinued Medications   No medications on file     Review of Systems  Constitutional: Positive for activity change (sedentary.), appetite change and fatigue. Negative for fever, diaphoresis and unexpected weight change.  HENT:       Left TM with blood on the surface and retracted. Abrasion on the left EAC.   Eyes: Negative.   Respiratory: Negative for cough, chest tightness, shortness of breath and wheezing.   Cardiovascular: Negative for chest pain, palpitations and leg swelling.  Gastrointestinal: Negative for nausea, abdominal pain, diarrhea, constipation and abdominal distention.  Endocrine: Negative.   Genitourinary: Negative.   Musculoskeletal:       Multiple uncomfortable joints. No inflammation or joint swelling.  Skin: Negative.   Neurological: Positive for weakness (generalized).  Psychiatric/Behavioral: Positive for sleep disturbance. Negative for suicidal ideas and hallucinations. The patient is nervous/anxious. The patient is not hyperactive.     Filed Vitals:   01/22/14 1519  BP: 114/64  Pulse: 76  Temp: 97.5 F (36.4 C)  TempSrc: Oral  SpO2: 95%   There is no weight on file to calculate BMI.  Physical Exam  Constitutional: She is oriented to person, place, and time. She appears well-developed and well-nourished. No distress.  Moderately overweight  HENT:  Head: Normocephalic and atraumatic.  Right Ear: External ear normal.  Left Ear: External ear normal.  Nose: Nose normal.  Mouth/Throat: Oropharynx is clear and moist.  Eyes: Conjunctivae and EOM are normal. Pupils are equal, round, and reactive to light.  Neck: No JVD present. No tracheal deviation present. No thyromegaly present.  Cardiovascular: Regular rhythm, normal heart sounds and intact distal pulses.  Exam reveals no gallop and no friction rub.   No murmur  heard. Mild tachcardia.  Pulmonary/Chest: No respiratory distress. She has no wheezes. She has no rales. She exhibits no tenderness.  Abdominal: She exhibits no distension and no mass. There is no tenderness.  Musculoskeletal: Normal range of motion. She exhibits no edema and no tenderness.  Lymphadenopathy:    She has no cervical adenopathy.  Neurological: She is alert and oriented to person, place, and time. She has normal reflexes. No cranial nerve deficit. Coordination normal.  Skin: No rash noted. No erythema. No pallor.  Psychiatric: She has a normal mood and affect. Her behavior is normal. Judgment and thought content normal.     Labs reviewed: Appointment on 01/14/2014  Component Date Value Ref Range Status  . TSH 01/14/2014 2.950  0.450 - 4.500 uIU/mL Final  . Glucose 01/14/2014 94  65 - 99 mg/dL Final  . BUN 16/10/960405/07/2014 8  8 - 27 mg/dL Final  . Creatinine, Ser 01/14/2014 0.68  0.57 - 1.00 mg/dL Final  . GFR calc non Af Amer 01/14/2014 90  >59 mL/min/1.73 Final  . GFR calc Af Amer 01/14/2014 104  >59 mL/min/1.73 Final  . BUN/Creatinine Ratio 01/14/2014 12  11 - 26 Final  . Sodium 01/14/2014 131* 134 - 144 mmol/L Final  . Potassium 01/14/2014 4.5  3.5 - 5.2 mmol/L Final  . Chloride 01/14/2014 93* 97 - 108 mmol/L Final  . CO2 01/14/2014 21  18 - 29 mmol/L Final  . Calcium 01/14/2014 8.8  8.7 - 10.3 mg/dL Final  . Total Protein 01/14/2014 5.8* 6.0 - 8.5 g/dL Final  . Albumin 54/09/811905/07/2014 3.9  3.6 - 4.8 g/dL Final  . Globulin, Total 01/14/2014 1.9  1.5 - 4.5 g/dL Final  . Albumin/Globulin Ratio 01/14/2014 2.1  1.1 - 2.5 Final  . Total Bilirubin 01/14/2014 0.3  0.0 - 1.2 mg/dL Final  . Alkaline Phosphatase 01/14/2014 59  39 - 117 IU/L Final  . AST 01/14/2014 10  0 - 40 IU/L Final  . ALT 01/14/2014 6  0 - 32 IU/L Final    01/22/14 EKG: Rate 83. Normal sinus rhythm. Normal EKG.  Assessment/Plan  1. Dizziness Etiology undetermined but most likely related to poor oral intake  and low blood pressure as well as for hyponatremia. - EKG 12-Lead  2. Syncope Did not fully lose consciousness - EKG 12-Lead  3. Hyponatremia Mild. Patient was advised to drink Gatorade for at least 1 quart daily. - Basic metabolic panel; Future  4. Unspecified hypothyroidism Compensated  5. Unspecified essential hypertension Now running low  6. Edema Trace to 1+ bipedal edema  7. Bipolar disorder, unspecified Under reasonable control on current medications.

## 2014-02-05 ENCOUNTER — Encounter: Payer: Self-pay | Admitting: Internal Medicine

## 2014-02-05 ENCOUNTER — Ambulatory Visit (INDEPENDENT_AMBULATORY_CARE_PROVIDER_SITE_OTHER): Payer: Medicare Other | Admitting: Internal Medicine

## 2014-02-05 VITALS — BP 100/62 | HR 88 | Temp 98.2°F | Wt 157.0 lb

## 2014-02-05 DIAGNOSIS — E871 Hypo-osmolality and hyponatremia: Secondary | ICD-10-CM

## 2014-02-05 DIAGNOSIS — R5381 Other malaise: Secondary | ICD-10-CM

## 2014-02-05 DIAGNOSIS — E039 Hypothyroidism, unspecified: Secondary | ICD-10-CM

## 2014-02-05 DIAGNOSIS — F411 Generalized anxiety disorder: Secondary | ICD-10-CM

## 2014-02-05 DIAGNOSIS — M797 Fibromyalgia: Secondary | ICD-10-CM

## 2014-02-05 DIAGNOSIS — F419 Anxiety disorder, unspecified: Secondary | ICD-10-CM

## 2014-02-05 DIAGNOSIS — IMO0001 Reserved for inherently not codable concepts without codable children: Secondary | ICD-10-CM

## 2014-02-05 DIAGNOSIS — R5383 Other fatigue: Secondary | ICD-10-CM

## 2014-02-05 MED ORDER — OXYCODONE HCL 30 MG PO TABS: ORAL_TABLET | ORAL | Status: DC

## 2014-02-05 NOTE — Patient Instructions (Signed)
I recommend regular physical activity.

## 2014-02-05 NOTE — Progress Notes (Signed)
Patient ID: Sarah Miranda, female   DOB: 15-Sep-1945, 68 y.o.   MRN: [id number]    Location:  [facility]  Place of Service: OFFICE   No Known Allergies  Chief Complaint  Patient presents with  . Medical Management of Chronic Issues    hyponatremia, dizziness, (L) ear infection not hurting, has a hissing sound in it.     HPI:  Has been feeling terrible with weakness and flare of pains she blames on her fibromyalgia. No fever or chills. Has not had a good appetitieuntil the last couple of days, She now feels like she is through the worst of this and is feeling better and eating better.  Hyponatremia: last lab shoed 131 meq. She was drinking Gatorade until recently to correct this.  Hypothyroidism: compensated with normal TSH on her last lab.  Other malaise and fatigue: generalized.  Probably related to fibromyalgia and depression.  Fibromyalgia -benefits from oxycodone  Anxiety: chronic and unchanged.    Medications: Patient's Medications  New Prescriptions   No medications on file  Previous Medications   ANTIPYRINE-BENZOCAINE (AURALGAN) OTIC SOLUTION    2-3 drops in painful ear every 2 hours as needed   FLUOXETINE (PROZAC) 40 MG CAPSULE    TAKE ONE CAPSULE EVERY DAY TO HELP NERVES AND DEPRESSION   LEVOTHYROXINE (SYNTHROID, LEVOTHROID) 25 MCG TABLET    TAKE 1 TABLET EVERY DAY FOR THYROID SUPPLEMENT   LYRICA 75 MG CAPSULE    TAKE 1 CAPSEULE TWICE DAILY TO HELP WITH PAIN   NEXIUM 40 MG CAPSULE    TAKE ONE CAPSULE BY MOUTH DAILY TO REDUCE STOMACH ACID   OXYCODONE (ROXICODONE) 30 MG IMMEDIATE RELEASE TABLET    Take one tablet every 3 hours as needed for pain.   RISPERIDONE (RISPERDAL) 1 MG TABLET    TAKE 1 TABLET BY MOUTH TWICE A DAY TO HELP RELAX  Modified Medications   No medications on file  Discontinued Medications   CETIRIZINE-PSEUDOEPHEDRINE (ZYRTEC-D) 5-120 MG PER TABLET    One twice daily to help congestion   CIPROFLOXACIN (CIPRO) 500 MG TABLET    One twice daily for infection      Review of Systems  Constitutional: Positive for activity change (sedentary.), appetite change and fatigue. Negative for fever, diaphoresis and unexpected weight change.  HENT:       Left TM with blood on the surface and retracted. Abrasion on the left EAC.   Eyes: Negative.   Respiratory: Negative for cough, chest tightness, shortness of breath and wheezing.   Cardiovascular: Negative for chest pain, palpitations and leg swelling.  Gastrointestinal: Negative for nausea, abdominal pain, diarrhea, constipation and abdominal distention.  Endocrine: Negative.   Genitourinary: Negative.   Musculoskeletal:       Multiple uncomfortable joints. No inflammation or joint swelling.  Skin: Negative.   Neurological: Positive for weakness (generalized).  Psychiatric/Behavioral: Positive for sleep disturbance. Negative for suicidal ideas and hallucinations. The patient is nervous/anxious. The patient is not hyperactive.     Filed Vitals:   02/05/14 1529  BP: 100/62  Pulse: 88  Temp: 98.2 F (36.8 C)  TempSrc: Oral  Weight: 157 lb (71.215 kg)  SpO2: 99%   Body mass index is 26.94 kg/(m^2).  Physical Exam  Constitutional: She is oriented to person, place, and time. She appears well-developed and well-nourished. No distress.  Moderately overweight  HENT:  Head: Normocephalic and atraumatic.  Right Ear: External ear normal.  Left Ear: External ear normal.  Nose: Nose normal.  Mouth/Throat: Oropharynx is  clear and moist.  Eyes: Conjunctivae and EOM are normal. Pupils are equal, round, and reactive to light.  Neck: No JVD present. No tracheal deviation present. No thyromegaly present.  Cardiovascular: Regular rhythm, normal heart sounds and intact distal pulses.  Exam reveals no gallop and no friction rub.   No murmur heard. Mild tachcardia.  Pulmonary/Chest: No respiratory distress. She has no wheezes. She has no rales. She exhibits no tenderness.  Abdominal: She exhibits no  distension and no mass. There is no tenderness.  Musculoskeletal: Normal range of motion. She exhibits no edema and no tenderness.  Lymphadenopathy:    She has no cervical adenopathy.  Neurological: She is alert and oriented to person, place, and time. She has normal reflexes. No cranial nerve deficit. Coordination normal.  Skin: No rash noted. No erythema. No pallor.  Psychiatric: She has a normal mood and affect. Her behavior is normal. Judgment and thought content normal.     Labs reviewed: Appointment on 01/14/2014  Component Date Value Ref Range Status  . TSH 01/14/2014 2.950  0.450 - 4.500 uIU/mL Final  . Glucose 01/14/2014 94  65 - 99 mg/dL Final  . BUN 16/10/960405/07/2014 8  8 - 27 mg/dL Final  . Creatinine, Ser 01/14/2014 0.68  0.57 - 1.00 mg/dL Final  . GFR calc non Af Amer 01/14/2014 90  >59 mL/min/1.73 Final  . GFR calc Af Amer 01/14/2014 104  >59 mL/min/1.73 Final  . BUN/Creatinine Ratio 01/14/2014 12  11 - 26 Final  . Sodium 01/14/2014 131* 134 - 144 mmol/L Final  . Potassium 01/14/2014 4.5  3.5 - 5.2 mmol/L Final  . Chloride 01/14/2014 93* 97 - 108 mmol/L Final  . CO2 01/14/2014 21  18 - 29 mmol/L Final  . Calcium 01/14/2014 8.8  8.7 - 10.3 mg/dL Final  . Total Protein 01/14/2014 5.8* 6.0 - 8.5 g/dL Final  . Albumin 54/09/811905/07/2014 3.9  3.6 - 4.8 g/dL Final  . Globulin, Total 01/14/2014 1.9  1.5 - 4.5 g/dL Final  . Albumin/Globulin Ratio 01/14/2014 2.1  1.1 - 2.5 Final  . Total Bilirubin 01/14/2014 0.3  0.0 - 1.2 mg/dL Final  . Alkaline Phosphatase 01/14/2014 59  39 - 117 IU/L Final  . AST 01/14/2014 10  0 - 40 IU/L Final  . ALT 01/14/2014 6  0 - 32 IU/L Final    02/05/14 EKG: rate 95; Sinus tachycardia. Note pulse rate of 54 about 2 weeks ago.   Assessment/Plan  1. Hyponatremia Recheck at next visit  2. Hypothyroidism compensated  3. Other malaise and fatigue Chronic. Advised her she needs to be active daily fior at least a 15-20 minute walk  4. Fibromyalgia -  oxycodone (ROXICODONE) 30 MG immediate release tablet; Take one tablet every 3 hours as needed for pain.  Dispense: 240 tablet; Refill: 0  5. Anxiety Observe   I am puzzled by the tachycardia today. Discussed referral to cardiology, but she does not want to do this. She will call if she changes her mind.  Also discussed referral to rheumatologist, but she does not want to do this.

## 2014-02-10 ENCOUNTER — Other Ambulatory Visit: Payer: Self-pay | Admitting: Internal Medicine

## 2014-02-19 ENCOUNTER — Other Ambulatory Visit: Payer: Self-pay | Admitting: Internal Medicine

## 2014-03-05 ENCOUNTER — Other Ambulatory Visit: Payer: Self-pay | Admitting: Internal Medicine

## 2014-03-07 ENCOUNTER — Other Ambulatory Visit: Payer: Self-pay

## 2014-03-07 DIAGNOSIS — M797 Fibromyalgia: Secondary | ICD-10-CM

## 2014-03-07 MED ORDER — OXYCODONE HCL 30 MG PO TABS: ORAL_TABLET | ORAL | Status: DC

## 2014-03-19 ENCOUNTER — Ambulatory Visit: Payer: Medicare Other | Admitting: Internal Medicine

## 2014-03-25 ENCOUNTER — Ambulatory Visit: Payer: Medicare Other | Admitting: Internal Medicine

## 2014-03-26 ENCOUNTER — Ambulatory Visit (INDEPENDENT_AMBULATORY_CARE_PROVIDER_SITE_OTHER): Payer: Medicare Other | Admitting: Internal Medicine

## 2014-03-26 ENCOUNTER — Encounter: Payer: Self-pay | Admitting: Internal Medicine

## 2014-03-26 VITALS — BP 104/62 | HR 102 | Temp 98.0°F | Wt 159.2 lb

## 2014-03-26 DIAGNOSIS — R5381 Other malaise: Secondary | ICD-10-CM

## 2014-03-26 DIAGNOSIS — IMO0001 Reserved for inherently not codable concepts without codable children: Secondary | ICD-10-CM

## 2014-03-26 DIAGNOSIS — M797 Fibromyalgia: Secondary | ICD-10-CM

## 2014-03-26 DIAGNOSIS — R5383 Other fatigue: Secondary | ICD-10-CM

## 2014-03-26 DIAGNOSIS — E871 Hypo-osmolality and hyponatremia: Secondary | ICD-10-CM

## 2014-03-26 DIAGNOSIS — F319 Bipolar disorder, unspecified: Secondary | ICD-10-CM

## 2014-03-26 DIAGNOSIS — I10 Essential (primary) hypertension: Secondary | ICD-10-CM

## 2014-03-27 LAB — BASIC METABOLIC PANEL
BUN/Creatinine Ratio: 8 — ABNORMAL LOW (ref 11–26)
BUN: 7 mg/dL — ABNORMAL LOW (ref 8–27)
CALCIUM: 8.4 mg/dL — AB (ref 8.7–10.3)
CHLORIDE: 94 mmol/L — AB (ref 97–108)
CO2: 23 mmol/L (ref 18–29)
Creatinine, Ser: 0.93 mg/dL (ref 0.57–1.00)
GFR calc Af Amer: 73 mL/min/{1.73_m2} (ref >59)
GFR, EST NON AFRICAN AMERICAN: 63 mL/min/{1.73_m2} (ref >59)
GLUCOSE: 92 mg/dL (ref 65–99)
POTASSIUM: 4.5 mmol/L (ref 3.5–5.2)
SODIUM: 131 mmol/L — AB (ref 134–144)

## 2014-03-27 NOTE — Progress Notes (Signed)
Patient ID: Sarah Miranda, female   DOB: 1946/07/22, 68 y.o.   MRN: [id number]    Location:    [facility]  Place of Service:  OFFICE    No Known Allergies  Chief Complaint  Patient presents with  . Follow-up    6 week f/u Hyponatremia & dizziness    HPI:  Returns today for followup of malaise, dizziness and hyponatremia. Overall, she is feeling better. The dizziness has resolved. She is still very tired and does not think her fibromyalgia is under good control.  Hyponatremia - needs recheck  Bipolar disorder, unspecified: stable on Prozac and risperdal  Fibromyalgia: chronic. Does not seem any more symptomatic than usual.  Hypertension: BP now running on the low side.  Other malaise and fatigue: mainly associated with her fibromyalgia and bipolar disorder.    Medications: Patient's Medications  New Prescriptions   No medications on file  Previous Medications   ANTIPYRINE-BENZOCAINE (AURALGAN) OTIC SOLUTION    2-3 drops in painful ear every 2 hours as needed   FLUOXETINE (PROZAC) 40 MG CAPSULE    TAKE ONE CAPSULE EVERY DAY TO HELP NERVES AND DEPRESSION   LEVOTHYROXINE (SYNTHROID, LEVOTHROID) 25 MCG TABLET    TAKE 1 TABLET EVERY DAY FOR THYROID SUPPLEMENT   LYRICA 75 MG CAPSULE    TAKE 1 CAPSEULE TWICE DAILY TO HELP WITH PAIN   NEXIUM 40 MG CAPSULE    TAKE ONE CAPSULE BY MOUTH DAILY TO REDUCE STOMACH ACID   OXYCODONE (ROXICODONE) 30 MG IMMEDIATE RELEASE TABLET    Take one tablet every 3 hours as needed for pain.   RISPERIDONE (RISPERDAL) 1 MG TABLET    TAKE 1 TABLET BY MOUTH TWICE A DAY TO HELP RELAX  Modified Medications   No medications on file  Discontinued Medications   LEVOTHYROXINE (SYNTHROID, LEVOTHROID) 25 MCG TABLET    TAKE 1 TABLET EVERY DAY FOR THYROID SUPPLEMENT     Review of Systems  Constitutional: Positive for activity change (sedentary.), appetite change and fatigue. Negative for fever, diaphoresis and unexpected weight change.  HENT: Positive for hearing  loss. Negative for congestion, ear discharge, ear pain, mouth sores, sore throat and trouble swallowing.   Eyes: Negative.   Respiratory: Negative for cough, chest tightness, shortness of breath and wheezing.   Cardiovascular: Negative for chest pain, palpitations and leg swelling.  Gastrointestinal: Negative for nausea, abdominal pain, diarrhea, constipation and abdominal distention.  Endocrine: Negative.   Genitourinary: Negative.   Musculoskeletal:       Multiple uncomfortable joints. No inflammation or joint swelling.  Skin: Negative.   Neurological: Positive for weakness (generalized).  Psychiatric/Behavioral: Positive for sleep disturbance. Negative for suicidal ideas and hallucinations. The patient is nervous/anxious. The patient is not hyperactive.     Filed Vitals:   03/26/14 1514  BP: 104/62  Pulse: 102  Temp: 98 F (36.7 C)  TempSrc: Oral  Weight: 159 lb 3.2 oz (72.213 kg)  SpO2: 97%   Body mass index is 27.31 kg/(m^2).  Physical Exam  Constitutional: She is oriented to person, place, and time. She appears well-developed and well-nourished. No distress.  Moderately overweight  HENT:  Head: Normocephalic and atraumatic.  Right Ear: External ear normal.  Left Ear: External ear normal.  Nose: Nose normal.  Mouth/Throat: Oropharynx is clear and moist.  Eyes: Conjunctivae and EOM are normal. Pupils are equal, round, and reactive to light.  Neck: No JVD present. No tracheal deviation present. No thyromegaly present.  Cardiovascular: Regular rhythm, normal heart sounds  and intact distal pulses.  Exam reveals no gallop and no friction rub.   No murmur heard. Mild tachcardia.  Pulmonary/Chest: No respiratory distress. She has no wheezes. She has no rales. She exhibits no tenderness.  Abdominal: She exhibits no distension and no mass. There is no tenderness.  Musculoskeletal: Normal range of motion. She exhibits no edema and no tenderness.  Lymphadenopathy:    She has no  cervical adenopathy.  Neurological: She is alert and oriented to person, place, and time. She has normal reflexes. No cranial nerve deficit. Coordination normal.  Skin: No rash noted. No erythema. No pallor.  Psychiatric: She has a normal mood and affect. Her behavior is normal. Judgment and thought content normal.     Labs reviewed: Office Visit on 03/26/2014  Component Date Value Ref Range Status  . Glucose 03/26/2014 92  65 - 99 mg/dL Final  . BUN 16/10/960407/21/2015 7* 8 - 27 mg/dL Final  . Creatinine, Ser 03/26/2014 0.93  0.57 - 1.00 mg/dL Final  . GFR calc non Af Amer 03/26/2014 63  >59 mL/min/1.73 Final  . GFR calc Af Amer 03/26/2014 73  >59 mL/min/1.73 Final  . BUN/Creatinine Ratio 03/26/2014 8* 11 - 26 Final  . Sodium 03/26/2014 131* 134 - 144 mmol/L Final  . Potassium 03/26/2014 4.5  3.5 - 5.2 mmol/L Final  . Chloride 03/26/2014 94* 97 - 108 mmol/L Final  . CO2 03/26/2014 23  18 - 29 mmol/L Final  . Calcium 03/26/2014 8.4* 8.7 - 10.3 mg/dL Final  Appointment on 54/09/811905/07/2014  Component Date Value Ref Range Status  . TSH 01/14/2014 2.950  0.450 - 4.500 uIU/mL Final  . Glucose 01/14/2014 94  65 - 99 mg/dL Final  . BUN 14/78/295605/07/2014 8  8 - 27 mg/dL Final  . Creatinine, Ser 01/14/2014 0.68  0.57 - 1.00 mg/dL Final  . GFR calc non Af Amer 01/14/2014 90  >59 mL/min/1.73 Final  . GFR calc Af Amer 01/14/2014 104  >59 mL/min/1.73 Final  . BUN/Creatinine Ratio 01/14/2014 12  11 - 26 Final  . Sodium 01/14/2014 131* 134 - 144 mmol/L Final  . Potassium 01/14/2014 4.5  3.5 - 5.2 mmol/L Final  . Chloride 01/14/2014 93* 97 - 108 mmol/L Final  . CO2 01/14/2014 21  18 - 29 mmol/L Final  . Calcium 01/14/2014 8.8  8.7 - 10.3 mg/dL Final  . Total Protein 01/14/2014 5.8* 6.0 - 8.5 g/dL Final  . Albumin 21/30/865705/07/2014 3.9  3.6 - 4.8 g/dL Final  . Globulin, Total 01/14/2014 1.9  1.5 - 4.5 g/dL Final  . Albumin/Globulin Ratio 01/14/2014 2.1  1.1 - 2.5 Final  . Total Bilirubin 01/14/2014 0.3  0.0 - 1.2 mg/dL  Final  . Alkaline Phosphatase 01/14/2014 59  39 - 117 IU/L Final  . AST 01/14/2014 10  0 - 40 IU/L Final  . ALT 01/14/2014 6  0 - 32 IU/L Final      Assessment/Plan  1. Hyponatremia Likely related to use of Prozac.  - Basic metabolic panel; Future - Basic metabolic panel  2. Bipolar disorder, unspecified continue current medications  3. Fibromyalgia Continue current medications  4. Hypertension Continue current medicatins  5. Other malaise and fatigue Continue current medications

## 2014-04-04 ENCOUNTER — Other Ambulatory Visit: Payer: Self-pay | Admitting: *Deleted

## 2014-04-04 DIAGNOSIS — M797 Fibromyalgia: Secondary | ICD-10-CM

## 2014-04-04 MED ORDER — OXYCODONE HCL 30 MG PO TABS: ORAL_TABLET | ORAL | Status: DC

## 2014-04-04 NOTE — Telephone Encounter (Signed)
Patient Requested 

## 2014-05-02 ENCOUNTER — Other Ambulatory Visit: Payer: Self-pay | Admitting: *Deleted

## 2014-05-02 DIAGNOSIS — M797 Fibromyalgia: Secondary | ICD-10-CM

## 2014-05-02 MED ORDER — OXYCODONE HCL 30 MG PO TABS: ORAL_TABLET | ORAL | Status: DC

## 2014-05-02 NOTE — Telephone Encounter (Signed)
Patient requested and Sarah Miranda (son) will be picking up

## 2014-05-14 ENCOUNTER — Telehealth: Payer: Self-pay | Admitting: *Deleted

## 2014-05-14 NOTE — Telephone Encounter (Signed)
Dr green please review this

## 2014-05-14 NOTE — Telephone Encounter (Signed)
Patient husband, Sharon Seller, called and wanted to know if patient can start a light exercise program with the supervision of a trainer. Patient husband wants only Dr. Chilton Si to give his advice. Please Advise.

## 2014-05-19 NOTE — Telephone Encounter (Signed)
Exercise with a trainer would be good for this patient.

## 2014-05-21 NOTE — Telephone Encounter (Signed)
Patient husband Notified

## 2014-05-29 ENCOUNTER — Encounter: Payer: Self-pay | Admitting: Internal Medicine

## 2014-05-29 ENCOUNTER — Ambulatory Visit (INDEPENDENT_AMBULATORY_CARE_PROVIDER_SITE_OTHER): Payer: Medicare Other | Admitting: Internal Medicine

## 2014-05-29 VITALS — BP 120/72 | HR 107 | Temp 98.1°F | Resp 18 | Ht 64.0 in | Wt 158.2 lb

## 2014-05-29 DIAGNOSIS — L84 Corns and callosities: Secondary | ICD-10-CM

## 2014-05-29 DIAGNOSIS — I10 Essential (primary) hypertension: Secondary | ICD-10-CM

## 2014-05-29 DIAGNOSIS — Z23 Encounter for immunization: Secondary | ICD-10-CM

## 2014-05-29 DIAGNOSIS — R7309 Other abnormal glucose: Secondary | ICD-10-CM

## 2014-05-29 DIAGNOSIS — M797 Fibromyalgia: Secondary | ICD-10-CM

## 2014-05-29 DIAGNOSIS — F319 Bipolar disorder, unspecified: Secondary | ICD-10-CM

## 2014-05-29 DIAGNOSIS — R739 Hyperglycemia, unspecified: Secondary | ICD-10-CM

## 2014-05-29 DIAGNOSIS — E871 Hypo-osmolality and hyponatremia: Secondary | ICD-10-CM

## 2014-05-29 DIAGNOSIS — IMO0001 Reserved for inherently not codable concepts without codable children: Secondary | ICD-10-CM

## 2014-05-29 MED ORDER — OXYCODONE HCL 30 MG PO TABS: ORAL_TABLET | ORAL | Status: DC

## 2014-05-29 NOTE — Progress Notes (Signed)
Patient ID: Sarah Miranda, female   DOB: June 29, 1946, 68 y.o.   MRN: [id number]    Location[facility]    Place of Service: OFFICE     No Known Allergies  Chief Complaint  Patient presents with  . Medical Management of Chronic Issues    callus on bottom of rt foot    HPI:  Callus: Corn on the ball of the right foot. Painful.  Fibromyalgia - benefits from oxycodone (ROXICODONE) 30 MG immediate release tablet  Bipolar disorder, unspecified: Stable at this time. She is having more "good" days. Energy seems return on those days. She is doing more walking.  Hypertension: Controlled  Hyponatremia: last value 131  Hyperglycemia: controlled  Need for prophylactic vaccination and inoculation against influenza    Medications: Patient's Medications  New Prescriptions   No medications on file  Previous Medications   ANTIPYRINE-BENZOCAINE (AURALGAN) OTIC SOLUTION    2-3 drops in painful ear every 2 hours as needed   FLUOXETINE (PROZAC) 40 MG CAPSULE    TAKE ONE CAPSULE EVERY DAY TO HELP NERVES AND DEPRESSION   LEVOTHYROXINE (SYNTHROID, LEVOTHROID) 25 MCG TABLET    TAKE 1 TABLET EVERY DAY FOR THYROID SUPPLEMENT   LYRICA 75 MG CAPSULE    TAKE 1 CAPSEULE TWICE DAILY TO HELP WITH PAIN   NEXIUM 40 MG CAPSULE    TAKE ONE CAPSULE BY MOUTH DAILY TO REDUCE STOMACH ACID   OXYCODONE (ROXICODONE) 30 MG IMMEDIATE RELEASE TABLET    Take one tablet every 3 hours as needed for pain.   RISPERIDONE (RISPERDAL) 1 MG TABLET    TAKE 1 TABLET BY MOUTH TWICE A DAY TO HELP RELAX  Modified Medications   No medications on file  Discontinued Medications   No medications on file     Review of Systems  Constitutional: Positive for activity change (sedentary.), appetite change and fatigue. Negative for fever, diaphoresis and unexpected weight change.  HENT: Positive for hearing loss. Negative for congestion, ear discharge, ear pain, mouth sores, sore throat and trouble swallowing.   Eyes: Negative.     Respiratory: Negative for cough, chest tightness, shortness of breath and wheezing.   Cardiovascular: Negative for chest pain, palpitations and leg swelling.  Gastrointestinal: Negative for nausea, abdominal pain, diarrhea, constipation and abdominal distention.  Endocrine: Negative.   Genitourinary: Negative.   Musculoskeletal:       Multiple uncomfortable joints. No inflammation or joint swelling.  Skin: Negative.   Neurological: Positive for weakness (generalized).  Psychiatric/Behavioral: Positive for sleep disturbance. Negative for suicidal ideas and hallucinations. The patient is nervous/anxious. The patient is not hyperactive.     Filed Vitals:   05/29/14 1535  BP: 120/72  Pulse: 107  Temp: 98.1 F (36.7 C)  TempSrc: Oral  Resp: 18  Height:  (1.626 m)  Weight: 158 lb 3.2 oz (71.759 kg)  SpO2: 96%   Body mass index is 27.14 kg/(m^2).  Physical Exam  Constitutional: She is oriented to person, place, and time. She appears well-developed and well-nourished. No distress.  Moderately overweight  HENT:  Head: Normocephalic and atraumatic.  Right Ear: External ear normal.  Left Ear: External ear normal.  Nose: Nose normal.  Mouth/Throat: Oropharynx is clear and moist.  Eyes: Conjunctivae and EOM are normal. Pupils are equal, round, and reactive to light.  Neck: No JVD present. No tracheal deviation present. No thyromegaly present.  Cardiovascular: Regular rhythm, normal heart sounds and intact distal pulses.  Exam reveals no gallop and no friction rub.  No murmur heard. Mild tachcardia.  Pulmonary/Chest: No respiratory distress. She has no wheezes. She has no rales. She exhibits no tenderness.  Abdominal: She exhibits no distension and no mass. There is no tenderness.  Musculoskeletal: Normal range of motion. She exhibits no edema and no tenderness.  Lymphadenopathy:    She has no cervical adenopathy.  Neurological: She is alert and oriented to person, place, and  time. She has normal reflexes. No cranial nerve deficit. Coordination normal.  Skin: No rash noted. No erythema. No pallor.  Painful callus on the bottom of the right foot.  Psychiatric: She has a normal mood and affect. Her behavior is normal. Judgment and thought content normal.     Labs reviewed: Office Visit on 03/26/2014  Component Date Value Ref Range Status  . Glucose 03/26/2014 92  65 - 99 mg/dL Final  . BUN 16/06/9603 7* 8 - 27 mg/dL Final  . Creatinine, Ser 03/26/2014 0.93  0.57 - 1.00 mg/dL Final  . GFR calc non Af Amer 03/26/2014 63  >59 mL/min/1.73 Final  . GFR calc Af Amer 03/26/2014 73  >59 mL/min/1.73 Final  . BUN/Creatinine Ratio 03/26/2014 8* 11 - 26 Final  . Sodium 03/26/2014 131* 134 - 144 mmol/L Final  . Potassium 03/26/2014 4.5  3.5 - 5.2 mmol/L Final  . Chloride 03/26/2014 94* 97 - 108 mmol/L Final  . CO2 03/26/2014 23  18 - 29 mmol/L Final  . Calcium 03/26/2014 8.4* 8.7 - 10.3 mg/dL Final     Assessment/Plan 1. Fibromyalgia - oxycodone (ROXICODONE) 30 MG immediate release tablet; Take one tablet every 3 hours as needed for pain.  Dispense: 240 tablet; Refill: 0  2. Callus Sharply debrided  3. Bipolar disorder, unspecified stable  4. Hypertension controlled  5. Hyponatremia stable  6. Hyperglycemia controlled  7. Need for prophylactic vaccination and inoculation against influenza Received

## 2014-06-02 DIAGNOSIS — L84 Corns and callosities: Secondary | ICD-10-CM | POA: Insufficient documentation

## 2014-06-26 ENCOUNTER — Other Ambulatory Visit: Payer: Self-pay | Admitting: *Deleted

## 2014-06-26 DIAGNOSIS — M797 Fibromyalgia: Secondary | ICD-10-CM

## 2014-06-26 MED ORDER — OXYCODONE HCL 30 MG PO TABS: ORAL_TABLET | ORAL | Status: DC

## 2014-06-26 NOTE — Telephone Encounter (Signed)
Patient requested and will pick up 

## 2014-06-30 ENCOUNTER — Other Ambulatory Visit: Payer: Self-pay | Admitting: Internal Medicine

## 2014-07-24 ENCOUNTER — Other Ambulatory Visit: Payer: Self-pay | Admitting: *Deleted

## 2014-07-24 DIAGNOSIS — M797 Fibromyalgia: Secondary | ICD-10-CM

## 2014-07-24 MED ORDER — OXYCODONE HCL 30 MG PO TABS: ORAL_TABLET | ORAL | Status: DC

## 2014-07-24 NOTE — Telephone Encounter (Signed)
Patient requested and will pick up. Going to be in town for Dr. Alfonzo BeersAppt

## 2014-08-18 ENCOUNTER — Other Ambulatory Visit: Payer: Self-pay | Admitting: Internal Medicine

## 2014-08-21 ENCOUNTER — Other Ambulatory Visit: Payer: Self-pay | Admitting: *Deleted

## 2014-08-21 DIAGNOSIS — M797 Fibromyalgia: Secondary | ICD-10-CM

## 2014-08-21 MED ORDER — OXYCODONE HCL 30 MG PO TABS: ORAL_TABLET | ORAL | Status: DC

## 2014-08-21 NOTE — Telephone Encounter (Signed)
Patient Requested and will pick up 

## 2014-08-28 ENCOUNTER — Encounter: Payer: Self-pay | Admitting: *Deleted

## 2014-09-12 ENCOUNTER — Other Ambulatory Visit: Payer: Self-pay | Admitting: Internal Medicine

## 2014-09-17 ENCOUNTER — Other Ambulatory Visit: Payer: Self-pay | Admitting: *Deleted

## 2014-09-17 DIAGNOSIS — M797 Fibromyalgia: Secondary | ICD-10-CM

## 2014-09-17 MED ORDER — OXYCODONE HCL 30 MG PO TABS: ORAL_TABLET | ORAL | Status: DC

## 2014-09-17 NOTE — Telephone Encounter (Signed)
Patient requested. Son will be in town today and will pick up

## 2014-10-15 ENCOUNTER — Other Ambulatory Visit: Payer: Self-pay | Admitting: *Deleted

## 2014-10-15 DIAGNOSIS — M797 Fibromyalgia: Secondary | ICD-10-CM

## 2014-10-15 MED ORDER — OXYCODONE HCL 30 MG PO TABS: ORAL_TABLET | ORAL | Status: DC

## 2014-10-15 NOTE — Telephone Encounter (Signed)
Patient Requested and son will pick up this afternoon.

## 2014-10-18 ENCOUNTER — Ambulatory Visit (INDEPENDENT_AMBULATORY_CARE_PROVIDER_SITE_OTHER): Payer: Medicare Other | Admitting: Internal Medicine

## 2014-10-18 ENCOUNTER — Encounter: Payer: Self-pay | Admitting: Internal Medicine

## 2014-10-18 ENCOUNTER — Telehealth: Payer: Self-pay | Admitting: *Deleted

## 2014-10-18 VITALS — BP 130/80 | HR 107 | Temp 98.7°F | Resp 20 | Ht 64.0 in | Wt 150.8 lb

## 2014-10-18 DIAGNOSIS — R000 Tachycardia, unspecified: Secondary | ICD-10-CM

## 2014-10-18 DIAGNOSIS — J22 Unspecified acute lower respiratory infection: Secondary | ICD-10-CM

## 2014-10-18 DIAGNOSIS — J988 Other specified respiratory disorders: Secondary | ICD-10-CM

## 2014-10-18 MED ORDER — HYDROCODONE-HOMATROPINE 5-1.5 MG/5ML PO SYRP: 5.0000 mL | ORAL_SOLUTION | Freq: Four times a day (QID) | ORAL | Status: DC | PRN

## 2014-10-18 MED ORDER — METHYLPREDNISOLONE ACETATE PF 80 MG/ML IJ SUSP: 40.0000 mg | Freq: Once | INTRAMUSCULAR | Status: DC

## 2014-10-18 MED ORDER — AZITHROMYCIN 250 MG PO TABS: ORAL_TABLET | ORAL | Status: DC

## 2014-10-18 NOTE — Addendum Note (Signed)
Addended by: Lamont SnowballICE, SHARON L on: 10/18/2014 04:44 PM   Modules accepted: Orders

## 2014-10-18 NOTE — Progress Notes (Signed)
Patient ID: Sarah CorinBrenda P Miranda, female   DOB: [birth year], 69 y.o.   MRN: [id number]    Facility  [facility]    Place of Service:   OFFICE   No Known Allergies  Chief Complaint  Patient presents with  . Acute Visit    cough, congestion x2.5 wks, OTC- Mucinex DM coughing up green mucus    HPI:  69 yo female seen today as an acute visit for above. She c/o pdtive cough with green sputum, sore throat and post nasal drip. (+) interruption of sleep. Chronic nausea and reduced appetite but not worse than usual. Denies CP, SOB, palpitations, f/c, sinus/nasal/ear sx's. She is taking cough drops prn. Spouse present. He had a cough several days but it resolved with OTC antihistamine.  Medications: Patient's Medications  New Prescriptions   No medications on file  Previous Medications   ANTIPYRINE-BENZOCAINE (AURALGAN) OTIC SOLUTION    2-3 drops in painful ear every 2 hours as needed   FLUOXETINE (PROZAC) 40 MG CAPSULE    TAKE ONE CAPSULE EVERY DAY TO HELP NERVES AND DEPRESSION   LEVOTHYROXINE (SYNTHROID, LEVOTHROID) 25 MCG TABLET    TAKE 1 TABLET EVERY DAY FOR THYROID SUPPLEMENT   LYRICA 75 MG CAPSULE    TAKE 1 CAPSEULE TWICE DAILY TO HELP WITH PAIN   NEXIUM 40 MG CAPSULE    TAKE ONE CAPSULE BY MOUTH DAILY TO REDUCE STOMACH ACID   OXYCODONE (ROXICODONE) 30 MG IMMEDIATE RELEASE TABLET    Take one tablet every 3 hours as needed for pain.   RISPERIDONE (RISPERDAL) 1 MG TABLET    TAKE 1 TABLET BY MOUTH TWICE A DAY TO HELP RELAX  Modified Medications   No medications on file  Discontinued Medications   LEVOTHYROXINE (SYNTHROID, LEVOTHROID) 25 MCG TABLET    TAKE 1 TABLET EVERY DAY FOR THYROID SUPPLEMENT   LEVOTHYROXINE (SYNTHROID, LEVOTHROID) 25 MCG TABLET    TAKE 1 TABLET EVERY DAY FOR THYROID SUPPLEMENT     Review of Systems  As above. All other systems reviewed are negative.  Filed Vitals:   10/18/14 1356  BP: 130/80  Pulse: 107 (stable)  Temp: 98.7 F (37.1 C)  TempSrc: Oral  Resp: 20    Height: 5\' 4"  (1.626 m)  Weight: 150 lb 12.8 oz (68.402 kg)  SpO2: 98%   Body mass index is 25.87 kg/(m^2).  Physical Exam CONSTITUTIONAL: Looks well in NAD. Awake, alert and oriented x 3 HEENT: PERRLA. No sinus TTP. Oropharynx cobblestoning and red but no exudate NECK: Supple. Nontender. No palpable cervical or supraclavicular lymph nodes.  CVS: Tachy without murmur, gallop or rub. LUNGS: right basilar inspiratory and expiratory rales. No wheezing or rhonchi. EXTREMITIES: No edema b/l.   Labs reviewed: No visits with results within 3 Month(s) from this visit.  Assessment/Plan   ICD-9-CM ICD-10-CM   1. LRTI (lower respiratory tract infection) with possible pneumonia 519.8 J98.8   2. Tachycardia (chronic and stable)  --Depo medrol 40mg  IM x 1  --Rx Zpak as directed   --Rx Hycodan prn cough  --push fluids and rest  --may take OTC plain claritin, allegra or zyrtec daily  --will consider CXR if sx's do not improve  --RTO if sx's no better in 72 hrs. keep appt with Dr Chilton SiGreen as scheduled   Bertis RuddyMonica S. Ancil Linseyarter, D. O., F. A. C. O. I.  [facility] [address] [place], [place] [address] [phone] Office (Wednesdays and Fridays 8 AM - 5 PM) [phone] Cell (Monday-Friday  8 AM - 5 PM)

## 2014-10-18 NOTE — Patient Instructions (Signed)
Push fluids and rest  May take plain OTC claritin, allegra or zyrtec daily  Follow up if no better in next 3 days  Keep appt with Dr Chilton SiGreen as scheduled

## 2014-10-18 NOTE — Telephone Encounter (Signed)
Has congestion and cough, with green mucus , she states that she seems to have gotten it from her husband. I informed her that she need to be seen by a doctor before any medication could be given to her, and she agreed to see Dr. Montez Moritaarter today.

## 2014-10-22 ENCOUNTER — Telehealth: Payer: Self-pay | Admitting: *Deleted

## 2014-10-22 NOTE — Telephone Encounter (Signed)
Sarah ChapelEllen with [employer] called and left voice message that she needed a Opoid Letter Response that was faxed to our office. No fax received. Tried calling her back at # [phone] that she left on voicemail and got someone else's voicemail. Left message and they called back and stated that was a wrong dept and transferred me to another voicemail. Left message to return call.

## 2014-11-11 ENCOUNTER — Other Ambulatory Visit: Payer: Self-pay | Admitting: *Deleted

## 2014-11-11 DIAGNOSIS — M797 Fibromyalgia: Secondary | ICD-10-CM

## 2014-11-11 MED ORDER — OXYCODONE HCL 30 MG PO TABS: ORAL_TABLET | ORAL | Status: DC

## 2014-11-11 NOTE — Telephone Encounter (Signed)
Patient requested and  Stated that she needs to pick up today due to transportation.

## 2014-11-22 ENCOUNTER — Other Ambulatory Visit: Payer: Self-pay | Admitting: Internal Medicine

## 2014-11-26 MED ORDER — METHYLPREDNISOLONE ACETATE 80 MG/ML IJ SUSP
80.0000 mg | Freq: Once | INTRAMUSCULAR | Status: AC
  Administered 2014-11-26: 80 mg via INTRAMUSCULAR

## 2014-11-26 NOTE — Addendum Note (Signed)
Addended by: Lamont SnowballICE, Chandon Lazcano L on: 11/26/2014 02:40 PM   Modules accepted: Medications

## 2014-11-26 NOTE — Addendum Note (Signed)
Addended by: Lamont SnowballICE, Mercedes Fort L on: 11/26/2014 04:15 PM   Modules accepted: Orders

## 2014-11-27 ENCOUNTER — Ambulatory Visit: Payer: Medicare Other | Admitting: Internal Medicine

## 2014-12-11 ENCOUNTER — Other Ambulatory Visit: Payer: Self-pay | Admitting: *Deleted

## 2014-12-11 DIAGNOSIS — M797 Fibromyalgia: Secondary | ICD-10-CM

## 2014-12-11 MED ORDER — OXYCODONE HCL 30 MG PO TABS: ORAL_TABLET | ORAL | Status: DC

## 2014-12-25 ENCOUNTER — Encounter: Payer: Self-pay | Admitting: Internal Medicine

## 2014-12-31 ENCOUNTER — Other Ambulatory Visit: Payer: Self-pay | Admitting: Internal Medicine

## 2014-12-31 DIAGNOSIS — Z1231 Encounter for screening mammogram for malignant neoplasm of breast: Secondary | ICD-10-CM

## 2015-01-07 ENCOUNTER — Ambulatory Visit (INDEPENDENT_AMBULATORY_CARE_PROVIDER_SITE_OTHER): Payer: Medicare Other | Admitting: Internal Medicine

## 2015-01-07 ENCOUNTER — Encounter: Payer: Self-pay | Admitting: Internal Medicine

## 2015-01-07 VITALS — BP 120/70 | HR 100 | Temp 99.2°F | Ht 64.0 in | Wt 148.2 lb

## 2015-01-07 DIAGNOSIS — R739 Hyperglycemia, unspecified: Secondary | ICD-10-CM | POA: Diagnosis not present

## 2015-01-07 DIAGNOSIS — F419 Anxiety disorder, unspecified: Secondary | ICD-10-CM

## 2015-01-07 DIAGNOSIS — I10 Essential (primary) hypertension: Secondary | ICD-10-CM | POA: Diagnosis not present

## 2015-01-07 DIAGNOSIS — E039 Hypothyroidism, unspecified: Secondary | ICD-10-CM

## 2015-01-07 DIAGNOSIS — E871 Hypo-osmolality and hyponatremia: Secondary | ICD-10-CM | POA: Diagnosis not present

## 2015-01-07 DIAGNOSIS — M797 Fibromyalgia: Secondary | ICD-10-CM | POA: Diagnosis not present

## 2015-01-07 DIAGNOSIS — F32A Depression, unspecified: Secondary | ICD-10-CM

## 2015-01-07 DIAGNOSIS — F329 Major depressive disorder, single episode, unspecified: Secondary | ICD-10-CM

## 2015-01-07 MED ORDER — OXYCODONE HCL 30 MG PO TABS: ORAL_TABLET | ORAL | Status: DC

## 2015-01-07 MED ORDER — PREGABALIN 75 MG PO CAPS: ORAL_CAPSULE | ORAL | Status: DC

## 2015-01-07 NOTE — Patient Instructions (Signed)
Try 1% hydrocortisone to scaly patches on the skin.

## 2015-01-07 NOTE — Progress Notes (Signed)
Patient ID: Sarah Miranda, female   DOB: [birth year], 69 y.o.   MRN: [id number]    Facility  [facility]    Place of Service:   OFFICE    No Known Allergies  Chief Complaint  Patient presents with  . Medical Management of Chronic Issues    6 month follow-up, no recent labs. Examine bottom of right foot.  . Colon Cancer Screening    Patient requested cologuard, form completed and faxed   . 6CIT Dementia Screening    scored 2, normal     HPI:  Hyperglycemia: Normal when last checked in 2015. Glucose equals 92 mg percent. Needs follow-up next visit.  Essential hypertension: Controlled  Hyponatremia: Normal when last checked. Needs follow-up next visit.  Hypothyroidism, unspecified hypothyroidism type: Normal when last checked. These follow-up next visit.  Depression: Patient says she is feeling better. Husband agrees.  Anxiety: Remains anxious at times, however current medications seem to be helping her cope better.  Fibromyalgia: Generalized myalgias and arthralgias. Easy fatigability.    Medications: Patient's Medications  New Prescriptions   No medications on file  Previous Medications   ANTIPYRINE-BENZOCAINE (AURALGAN) OTIC SOLUTION    2-3 drops in painful ear every 2 hours as needed   FLUOXETINE (PROZAC) 40 MG CAPSULE    TAKE ONE CAPSULE EVERY DAY TO HELP NERVES AND DEPRESSION   LEVOTHYROXINE (SYNTHROID, LEVOTHROID) 25 MCG TABLET    TAKE 1 TABLET EVERY DAY FOR THYROID SUPPLEMENT   LYRICA 75 MG CAPSULE    TAKE 1 CAPSEULE TWICE DAILY TO HELP WITH PAIN   NEXIUM 40 MG CAPSULE    TAKE ONE CAPSULE BY MOUTH DAILY TO REDUCE STOMACH ACID   OXYCODONE (ROXICODONE) 30 MG IMMEDIATE RELEASE TABLET    Take one tablet every 3 hours as needed for pain.   RISPERIDONE (RISPERDAL) 1 MG TABLET    TAKE 1 TABLET BY MOUTH TWICE A DAY TO HELP RELAX  Modified Medications   No medications on file  Discontinued Medications   AZITHROMYCIN (ZITHROMAX) 250 MG TABLET    Take 2 tabs on day 1 and then 1  tab po daily on days 2-5.   HYDROCODONE-HOMATROPINE (HYCODAN) 5-1.5 MG/5ML SYRUP    Take 5 mLs by mouth every 6 (six) hours as needed for cough.   METHYLPREDNISOLONE ACETATE (DEPO-MEDROL) 80 MG/ML INJECTION    Inject 80 mg into the muscle once.   METHYLPREDNISOLONE ACETATE PF (DEPO-MEDROL) 80 MG/ML SUSP INJECTION    Inject 0.5 mLs (40 mg total) into the muscle once.     Review of Systems  Constitutional: Positive for activity change (sedentary.), appetite change and fatigue. Negative for fever, diaphoresis and unexpected weight change.  HENT: Positive for hearing loss. Negative for congestion, ear discharge, ear pain, mouth sores, sore throat and trouble swallowing.   Eyes: Negative.   Respiratory: Negative for cough, chest tightness, shortness of breath and wheezing.   Cardiovascular: Negative for chest pain, palpitations and leg swelling.  Gastrointestinal: Negative for nausea, abdominal pain, diarrhea, constipation and abdominal distention.  Endocrine: Negative.   Genitourinary: Negative.   Musculoskeletal: Positive for myalgias.       Multiple uncomfortable joints. No inflammation or joint swelling.  Skin: Negative.   Neurological: Positive for weakness (generalized).  Psychiatric/Behavioral: Positive for sleep disturbance. Negative for suicidal ideas and hallucinations. The patient is nervous/anxious. The patient is not hyperactive.     Filed Vitals:   01/07/15 1434  BP: 120/70  Pulse: 100  Temp: 99.2 F (37.3 C)  TempSrc: Oral  Height:  (1.626 m)  Weight: 148 lb 3.2 oz (67.223 kg)  SpO2: 99%   Body mass index is 25.43 kg/(m^2).  Physical Exam  Constitutional: She is oriented to person, place, and time. She appears well-developed and well-nourished. No distress.  Moderately overweight  HENT:  Head: Normocephalic and atraumatic.  Right Ear: External ear normal.  Left Ear: External ear normal.  Nose: Nose normal.  Mouth/Throat: Oropharynx is clear and moist.    Eyes: Conjunctivae and EOM are normal. Pupils are equal, round, and reactive to light.  Neck: No JVD present. No tracheal deviation present. No thyromegaly present.  Cardiovascular: Regular rhythm, normal heart sounds and intact distal pulses.  Exam reveals no gallop and no friction rub.   No murmur heard. Mild tachcardia.  Pulmonary/Chest: No respiratory distress. She has no wheezes. She has no rales. She exhibits no tenderness.  Abdominal: She exhibits no distension and no mass. There is no tenderness.  Musculoskeletal: Normal range of motion. She exhibits no edema or tenderness.  Lymphadenopathy:    She has no cervical adenopathy.  Neurological: She is alert and oriented to person, place, and time. She has normal reflexes. No cranial nerve deficit. Coordination normal.  Skin: No rash noted. No erythema. No pallor.  Painful callus on the bottom of the right foot.  Psychiatric: She has a normal mood and affect. Her behavior is normal. Judgment and thought content normal.     Labs reviewed: No visits with results within 3 Month(s) from this visit. Latest known visit with results is:  Office Visit on 03/26/2014  Component Date Value Ref Range Status  . Glucose 03/26/2014 92  65 - 99 mg/dL Final  . BUN 16/06/9603 7* 8 - 27 mg/dL Final  . Creatinine, Ser 03/26/2014 0.93  0.57 - 1.00 mg/dL Final  . GFR calc non Af Amer 03/26/2014 63  >59 mL/min/1.73 Final  . GFR calc Af Amer 03/26/2014 73  >59 mL/min/1.73 Final  . BUN/Creatinine Ratio 03/26/2014 8* 11 - 26 Final  . Sodium 03/26/2014 131* 134 - 144 mmol/L Final  . Potassium 03/26/2014 4.5  3.5 - 5.2 mmol/L Final  . Chloride 03/26/2014 94* 97 - 108 mmol/L Final  . CO2 03/26/2014 23  18 - 29 mmol/L Final  . Calcium 03/26/2014 8.4* 8.7 - 10.3 mg/dL Final     Assessment/Plan 1. Hyperglycemia - Comprehensive metabolic panel; Future  2. Essential hypertension - Comprehensive metabolic panel; Future  3. Hyponatremia - Comprehensive  metabolic panel; Future  4. Hypothyroidism, unspecified hypothyroidism type - TSH; Future  5. Depression Continue current medication  6. Anxiety Continue current medication  7. Fibromyalgia Patient willing to continue Lyrica. She believes it is helping. - pregabalin (LYRICA) 75 MG capsule; TAKE 1 CAPSULE TWICE DAILY TO HELP WITH PAIN  Dispense: 60 capsule; Refill: 2

## 2015-01-08 ENCOUNTER — Ambulatory Visit (HOSPITAL_COMMUNITY): Payer: Medicare Other

## 2015-01-14 DIAGNOSIS — Z1211 Encounter for screening for malignant neoplasm of colon: Secondary | ICD-10-CM | POA: Diagnosis not present

## 2015-01-14 DIAGNOSIS — Z1212 Encounter for screening for malignant neoplasm of rectum: Secondary | ICD-10-CM | POA: Diagnosis not present

## 2015-01-14 LAB — FECAL OCCULT BLOOD, GUAIAC: Fecal Occult Blood: NEGATIVE

## 2015-01-21 ENCOUNTER — Encounter: Payer: Self-pay | Admitting: *Deleted

## 2015-01-23 ENCOUNTER — Telehealth: Payer: Self-pay | Admitting: *Deleted

## 2015-01-23 NOTE — Telephone Encounter (Signed)
Called patient to inform her of the results of the Cologuard test as being negative, she stated that she did not have any questions at this time.

## 2015-02-06 ENCOUNTER — Other Ambulatory Visit: Payer: Self-pay | Admitting: *Deleted

## 2015-02-06 DIAGNOSIS — M797 Fibromyalgia: Secondary | ICD-10-CM

## 2015-02-06 MED ORDER — OXYCODONE HCL 30 MG PO TABS
ORAL_TABLET | ORAL | Status: DC
Start: 2015-02-06 — End: 2015-03-06

## 2015-02-06 NOTE — Telephone Encounter (Signed)
Patient requested and son, Lyda PeroneKirk will pick up

## 2015-02-08 ENCOUNTER — Other Ambulatory Visit: Payer: Self-pay | Admitting: Nurse Practitioner

## 2015-03-06 ENCOUNTER — Other Ambulatory Visit: Payer: Self-pay | Admitting: *Deleted

## 2015-03-06 DIAGNOSIS — M797 Fibromyalgia: Secondary | ICD-10-CM

## 2015-03-06 MED ORDER — OXYCODONE HCL 30 MG PO TABS: ORAL_TABLET | ORAL | Status: DC

## 2015-03-06 NOTE — Telephone Encounter (Signed)
Patient requested and will pick up 

## 2015-03-27 ENCOUNTER — Telehealth: Payer: Self-pay

## 2015-03-27 NOTE — Telephone Encounter (Signed)
Message left on triage voicemail. Patient's husband would like to know if it would be beneficial for patient to see chiropractor for fibromyalgia. Patient's husband was told this could help his wife by someone else but would like the recommendation of a long-term expert.  Dr.Green please advise

## 2015-03-28 NOTE — Telephone Encounter (Signed)
Patient husband notified and agreed. He will set up an appointment for his wife with his Chiropractor.

## 2015-03-28 NOTE — Telephone Encounter (Signed)
I think that seeing a chiropractor would be reasonable. Maybe they would have a different or better approach to relieve her chronic pains.

## 2015-03-29 ENCOUNTER — Other Ambulatory Visit: Payer: Self-pay | Admitting: Internal Medicine

## 2015-04-02 ENCOUNTER — Other Ambulatory Visit: Payer: Self-pay

## 2015-04-02 DIAGNOSIS — M797 Fibromyalgia: Secondary | ICD-10-CM

## 2015-04-02 MED ORDER — OXYCODONE HCL 30 MG PO TABS: ORAL_TABLET | ORAL | Status: DC

## 2015-04-02 NOTE — Telephone Encounter (Signed)
Patient is leaving today to go out of town. Patient needs a refill on Oxycodone (last filled 03/06/15). Patients son will stop by to pick-up rx after 2 pm.

## 2015-04-29 ENCOUNTER — Other Ambulatory Visit: Payer: Self-pay | Admitting: *Deleted

## 2015-04-29 DIAGNOSIS — M797 Fibromyalgia: Secondary | ICD-10-CM

## 2015-04-29 MED ORDER — OXYCODONE HCL 30 MG PO TABS: ORAL_TABLET | ORAL | Status: DC

## 2015-04-29 NOTE — Telephone Encounter (Signed)
Patient son stopped to pick up while he was having bloodwork done. Mother has oked for him to pick up her Rx's. Shanda Bumps agreed to sign a couple days early with comment on Rx "not to fill until 05/02/15."

## 2015-05-09 ENCOUNTER — Other Ambulatory Visit: Payer: Medicare Other

## 2015-05-09 DIAGNOSIS — R739 Hyperglycemia, unspecified: Secondary | ICD-10-CM | POA: Diagnosis not present

## 2015-05-09 DIAGNOSIS — I10 Essential (primary) hypertension: Secondary | ICD-10-CM

## 2015-05-09 DIAGNOSIS — E039 Hypothyroidism, unspecified: Secondary | ICD-10-CM | POA: Diagnosis not present

## 2015-05-09 DIAGNOSIS — E871 Hypo-osmolality and hyponatremia: Secondary | ICD-10-CM

## 2015-05-10 LAB — TSH: TSH: 2.04 u[IU]/mL (ref 0.450–4.500)

## 2015-05-10 LAB — COMPREHENSIVE METABOLIC PANEL
A/G RATIO: 1.8 (ref 1.1–2.5)
ALK PHOS: 48 IU/L (ref 39–117)
ALT: 8 IU/L (ref 0–32)
AST: 13 IU/L (ref 0–40)
Albumin: 3.5 g/dL — ABNORMAL LOW (ref 3.6–4.8)
BUN/Creatinine Ratio: 9 — ABNORMAL LOW (ref 11–26)
BUN: 6 mg/dL — ABNORMAL LOW (ref 8–27)
Bilirubin Total: 0.4 mg/dL (ref 0.0–1.2)
CO2: 23 mmol/L (ref 18–29)
Calcium: 8.6 mg/dL — ABNORMAL LOW (ref 8.7–10.3)
Chloride: 94 mmol/L — ABNORMAL LOW (ref 97–108)
Creatinine, Ser: 0.67 mg/dL (ref 0.57–1.00)
GFR calc Af Amer: 104 mL/min/{1.73_m2} (ref >59)
GFR, EST NON AFRICAN AMERICAN: 90 mL/min/{1.73_m2} (ref >59)
GLUCOSE: 87 mg/dL (ref 65–99)
Globulin, Total: 1.9 g/dL (ref 1.5–4.5)
POTASSIUM: 4.5 mmol/L (ref 3.5–5.2)
Sodium: 132 mmol/L — ABNORMAL LOW (ref 134–144)
TOTAL PROTEIN: 5.4 g/dL — AB (ref 6.0–8.5)

## 2015-05-13 ENCOUNTER — Encounter: Payer: Self-pay | Admitting: Internal Medicine

## 2015-05-13 ENCOUNTER — Ambulatory Visit (INDEPENDENT_AMBULATORY_CARE_PROVIDER_SITE_OTHER): Payer: Medicare Other | Admitting: Internal Medicine

## 2015-05-13 VITALS — BP 118/70 | HR 89 | Temp 98.6°F | Ht 64.0 in | Wt 152.0 lb

## 2015-05-13 DIAGNOSIS — M797 Fibromyalgia: Secondary | ICD-10-CM

## 2015-05-13 DIAGNOSIS — R739 Hyperglycemia, unspecified: Secondary | ICD-10-CM | POA: Diagnosis not present

## 2015-05-13 DIAGNOSIS — I10 Essential (primary) hypertension: Secondary | ICD-10-CM | POA: Diagnosis not present

## 2015-05-13 DIAGNOSIS — E871 Hypo-osmolality and hyponatremia: Secondary | ICD-10-CM | POA: Diagnosis not present

## 2015-05-13 DIAGNOSIS — F3175 Bipolar disorder, in partial remission, most recent episode depressed: Secondary | ICD-10-CM

## 2015-05-13 DIAGNOSIS — E039 Hypothyroidism, unspecified: Secondary | ICD-10-CM | POA: Diagnosis not present

## 2015-05-13 DIAGNOSIS — Z23 Encounter for immunization: Secondary | ICD-10-CM | POA: Diagnosis not present

## 2015-05-13 NOTE — Progress Notes (Signed)
Patient ID: Sarah Miranda, female   DOB: [birth year], 69 y.o.   MRN: [id number]    Facility  [facility]    Place of Service:   OFFICE    No Known Allergies  Chief Complaint  Patient presents with  . Medical Management of Chronic Issues    4 month follow-up     HPI:  Not walking or exercising. Feels good sometimes, but has many down days. Husband tries to encourage her, but she is difficult to motivate. She says that she knows that she needs to be more active, but she resists increasing activity because of anticipation of pain and the days of recovery. She says that she feels like her current medications for her emotional state are adequately helping her and she does not want to change. She continues to use her pain medications for her fibromyalgia.  Essential hypertension - controlled  Hyperglycemia - glucose of 87 mg percent when checked 05/09/15  Hyponatremia - chronic and possibly related to Prozac. Sodium of 132 on 05/09/15  Hypothyroidism, unspecified hypothyroidism type - TSH 2.040   Fibromyalgia - chronic disorder for which she is using oxycodone  Bipolar disorder, in partial remission, most recent episode depressed - continues on Prozac and Risperdal  Encounter for immunization -  flu vaccine    Medications: Patient's Medications  New Prescriptions   No medications on file  Previous Medications   ANTIPYRINE-BENZOCAINE (AURALGAN) OTIC SOLUTION    2-3 drops in painful ear every 2 hours as needed   FLUOXETINE (PROZAC) 40 MG CAPSULE    TAKE ONE CAPSULE EVERY DAY TO HELP NERVES AND DEPRESSION   LEVOTHYROXINE (SYNTHROID, LEVOTHROID) 25 MCG TABLET    TAKE 1 TABLET BY MOUTH EVERY DAY   NEXIUM 40 MG CAPSULE    TAKE ONE CAPSULE BY MOUTH DAILY TO REDUCE STOMACH ACID   OXYCODONE (ROXICODONE) 30 MG IMMEDIATE RELEASE TABLET    Take one tablet every 3 hours as needed for pain.   PREGABALIN (LYRICA) 75 MG CAPSULE    TAKE 1 CAPSULE TWICE DAILY TO HELP WITH PAIN   RISPERIDONE (RISPERDAL) 1  MG TABLET    TAKE 1 TABLET BY MOUTH TWICE A DAY TO HELP RELAX  Modified Medications   No medications on file  Discontinued Medications   LEVOTHYROXINE (SYNTHROID, LEVOTHROID) 25 MCG TABLET    TAKE 1 TABLET EVERY DAY FOR THYROID SUPPLEMENT     Review of Systems  Constitutional: Positive for activity change (sedentary.), appetite change and fatigue. Negative for fever, diaphoresis and unexpected weight change.  HENT: Positive for hearing loss. Negative for congestion, ear discharge, ear pain, mouth sores, sore throat and trouble swallowing.   Eyes: Negative.   Respiratory: Negative for cough, chest tightness, shortness of breath and wheezing.   Cardiovascular: Negative for chest pain, palpitations and leg swelling.  Gastrointestinal: Negative for nausea, abdominal pain, diarrhea, constipation and abdominal distention.  Endocrine: Negative.   Genitourinary: Negative.   Musculoskeletal: Positive for myalgias.       Multiple uncomfortable joints. No inflammation or joint swelling.  Skin: Negative.   Neurological: Positive for weakness (generalized).  Psychiatric/Behavioral: Positive for sleep disturbance. Negative for suicidal ideas and hallucinations. The patient is nervous/anxious. The patient is not hyperactive.     Filed Vitals:   05/13/15 1424  BP: 118/70  Pulse: 89  Temp: 98.6 F (37 C)  TempSrc: Oral  Height: $Remove'5\' 4"'hwNAwUq$  (1.626 m)  Weight: 152 lb (68.947 kg)  SpO2: 98%   Body mass index is 26.08  kg/(m^2).  Physical Exam  Constitutional: She is oriented to person, place, and time. She appears well-developed and well-nourished. No distress.  Moderately overweight  HENT:  Head: Normocephalic and atraumatic.  Right Ear: External ear normal.  Left Ear: External ear normal.  Nose: Nose normal.  Mouth/Throat: Oropharynx is clear and moist.  Eyes: Conjunctivae and EOM are normal. Pupils are equal, round, and reactive to light.  Neck: No JVD present. No tracheal deviation present.  No thyromegaly present.  Cardiovascular: Regular rhythm, normal heart sounds and intact distal pulses.  Exam reveals no gallop and no friction rub.   No murmur heard. Mild tachcardia.  Pulmonary/Chest: No respiratory distress. She has no wheezes. She has no rales. She exhibits no tenderness.  Abdominal: She exhibits no distension and no mass. There is no tenderness.  Musculoskeletal: Normal range of motion. She exhibits no edema or tenderness.  Lymphadenopathy:    She has no cervical adenopathy.  Neurological: She is alert and oriented to person, place, and time. She has normal reflexes. No cranial nerve deficit. Coordination normal.  Skin: No rash noted. No erythema. No pallor.  Painful callus on the bottom of the right foot.  Psychiatric: She has a normal mood and affect. Her behavior is normal. Judgment and thought content normal.     Labs reviewed: Lab Summary Latest Ref Rng 05/09/2015 03/26/2014 01/14/2014  Hemoglobin 13.0-17.0 g/dL (None) (None) (None)  Hematocrit 39.0-52.0 % (None) (None) (None)  White count - (None) (None) (None)  Platelet count - (None) (None) (None)  Sodium 134 - 144 mmol/L 132(L) 131(L) 131(L)  Potassium 3.5 - 5.2 mmol/L 4.5 4.5 4.5  Calcium 8.7 - 10.3 mg/dL 8.6(L) 8.4(L) 8.8  Phosphorus - (None) (None) (None)  Creatinine 0.57 - 1.00 mg/dL 0.67 0.93 0.68  AST 0 - 40 IU/L 13 (None) 10  Alk Phos 39 - 117 IU/L 48 (None) 59  Bilirubin 0.0 - 1.2 mg/dL 0.4 (None) 0.3  Glucose 65 - 99 mg/dL 87 92 94  Cholesterol - (None) (None) (None)  HDL cholesterol - (None) (None) (None)  Triglycerides - (None) (None) (None)  LDL Direct - (None) (None) (None)  LDL Calc - (None) (None) (None)  Total protein - (None) (None) (None)  Albumin 3.6 - 4.8 g/dL 3.5(L) (None) 3.9   Lab Results  Component Value Date   TSH 2.040 05/09/2015   Lab Results  Component Value Date   BUN 6* 05/09/2015    Assessment/Plan   1. Essential hypertension Controlled  2.  Hyperglycemia Controlled  3. Hyponatremia Stable  4. Hypothyroidism, unspecified hypothyroidism type Compensated - TSH; Future  5. Fibromyalgia Continue current medications  6. Bipolar disorder, in partial remission, most recent episode depressed Continue current medications  7. Encounter for immunization Flu vaccine

## 2015-05-30 ENCOUNTER — Other Ambulatory Visit: Payer: Self-pay | Admitting: *Deleted

## 2015-05-30 DIAGNOSIS — M797 Fibromyalgia: Secondary | ICD-10-CM

## 2015-05-30 MED ORDER — OXYCODONE HCL 30 MG PO TABS: ORAL_TABLET | ORAL | Status: DC

## 2015-05-30 NOTE — Telephone Encounter (Signed)
Patient requested and Son, Lyda Perone will pick up

## 2015-06-26 ENCOUNTER — Telehealth: Payer: Self-pay

## 2015-06-26 NOTE — Telephone Encounter (Signed)
error 

## 2015-06-27 ENCOUNTER — Other Ambulatory Visit: Payer: Self-pay | Admitting: *Deleted

## 2015-06-27 DIAGNOSIS — M797 Fibromyalgia: Secondary | ICD-10-CM

## 2015-06-27 MED ORDER — OXYCODONE HCL 30 MG PO TABS: ORAL_TABLET | ORAL | Status: DC

## 2015-06-27 NOTE — Telephone Encounter (Signed)
Patient requested and will pick up. Drug contract printed. 

## 2015-07-25 ENCOUNTER — Other Ambulatory Visit: Payer: Self-pay

## 2015-07-25 DIAGNOSIS — M797 Fibromyalgia: Secondary | ICD-10-CM

## 2015-07-25 MED ORDER — OXYCODONE HCL 30 MG PO TABS: ORAL_TABLET | ORAL | Status: DC

## 2015-08-14 ENCOUNTER — Telehealth: Payer: Self-pay

## 2015-08-14 NOTE — Telephone Encounter (Signed)
Left message on voicemail for patient to return call when available   

## 2015-08-22 ENCOUNTER — Other Ambulatory Visit: Payer: Self-pay | Admitting: *Deleted

## 2015-08-22 DIAGNOSIS — M797 Fibromyalgia: Secondary | ICD-10-CM

## 2015-08-22 MED ORDER — OXYCODONE HCL 30 MG PO TABS: ORAL_TABLET | ORAL | Status: DC

## 2015-08-22 NOTE — Telephone Encounter (Signed)
Patient requested and will pick up 

## 2015-08-28 ENCOUNTER — Other Ambulatory Visit: Payer: Self-pay | Admitting: Internal Medicine

## 2015-09-05 ENCOUNTER — Ambulatory Visit (INDEPENDENT_AMBULATORY_CARE_PROVIDER_SITE_OTHER): Payer: Medicare Other

## 2015-09-05 DIAGNOSIS — Z23 Encounter for immunization: Secondary | ICD-10-CM | POA: Diagnosis not present

## 2015-09-19 ENCOUNTER — Other Ambulatory Visit: Payer: Self-pay | Admitting: *Deleted

## 2015-09-19 DIAGNOSIS — M797 Fibromyalgia: Secondary | ICD-10-CM

## 2015-09-19 MED ORDER — OXYCODONE HCL 30 MG PO TABS: ORAL_TABLET | ORAL | Status: DC

## 2015-09-19 NOTE — Telephone Encounter (Signed)
Patient requested and will pick up 

## 2015-10-17 ENCOUNTER — Other Ambulatory Visit: Payer: Self-pay | Admitting: *Deleted

## 2015-10-17 DIAGNOSIS — M797 Fibromyalgia: Secondary | ICD-10-CM

## 2015-10-17 MED ORDER — OXYCODONE HCL 30 MG PO TABS: ORAL_TABLET | ORAL | Status: DC

## 2015-10-17 NOTE — Telephone Encounter (Signed)
Patient requested and will pick up 

## 2015-10-23 ENCOUNTER — Encounter: Payer: Self-pay | Admitting: Internal Medicine

## 2015-11-11 ENCOUNTER — Encounter: Payer: Self-pay | Admitting: Internal Medicine

## 2015-11-11 ENCOUNTER — Ambulatory Visit (INDEPENDENT_AMBULATORY_CARE_PROVIDER_SITE_OTHER): Payer: Medicare Other | Admitting: Internal Medicine

## 2015-11-11 VITALS — BP 90/60 | HR 90 | Temp 98.2°F | Resp 20 | Ht 64.0 in | Wt 149.2 lb

## 2015-11-11 DIAGNOSIS — M797 Fibromyalgia: Secondary | ICD-10-CM

## 2015-11-11 DIAGNOSIS — I10 Essential (primary) hypertension: Secondary | ICD-10-CM

## 2015-11-11 DIAGNOSIS — E871 Hypo-osmolality and hyponatremia: Secondary | ICD-10-CM

## 2015-11-11 DIAGNOSIS — R609 Edema, unspecified: Secondary | ICD-10-CM

## 2015-11-11 DIAGNOSIS — E039 Hypothyroidism, unspecified: Secondary | ICD-10-CM | POA: Diagnosis not present

## 2015-11-11 NOTE — Progress Notes (Signed)
Patient ID: Sarah Miranda, female   DOB: [birth year], 70 y.o.   MRN: [id number]    Facility  [facility]    Place of Service:   OFFICE    No Known Allergies  Chief Complaint  Patient presents with  . Medical Management of Chronic Issues    HPI:  Fibromyalgia - continues to have good and bad days with the bad days out numbering the good days. Today she is feeling relatively well. Generalized aches and pains highly suggestive of fibromyalgia have persisted over several years now. She does not exercise. She does not walk regularly. She has done these things in the past, but when the bad days accumulate, she never seems to get back into a regular pattern of increased activity. She is able to do housework generally. She says her nerves are doing okay overall and does not feel excessively depressed or anxious.   Essential hypertension - controlled  Edema, unspecified type - controlled  Hypothyroidism, unspecified hypothyroidism type - needs follow-up TSH  Hyponatremia - needs follow-up lab    Medications: Patient's Medications  New Prescriptions   No medications on file  Previous Medications   ANTIPYRINE-BENZOCAINE (AURALGAN) OTIC SOLUTION    2-3 drops in painful ear every 2 hours as needed   FLUOXETINE (PROZAC) 40 MG CAPSULE    TAKE ONE CAPSULE EVERY DAY TO HELP NERVES AND DEPRESSION   LEVOTHYROXINE (SYNTHROID, LEVOTHROID) 25 MCG TABLET    TAKE 1 TABLET BY MOUTH EVERY DAY   NEXIUM 40 MG CAPSULE    TAKE ONE CAPSULE BY MOUTH DAILY TO REDUCE STOMACH ACID   OXYCODONE (ROXICODONE) 30 MG IMMEDIATE RELEASE TABLET    Take one tablet every 3 hours as needed for pain.   PREGABALIN (LYRICA) 75 MG CAPSULE    TAKE 1 CAPSULE TWICE DAILY TO HELP WITH PAIN   RISPERIDONE (RISPERDAL) 1 MG TABLET    TAKE 1 TABLET BY MOUTH TWICE A DAY TO HELP RELAX  Modified Medications   No medications on file  Discontinued Medications   No medications on file    Review of Systems  Constitutional: Positive for  activity change (sedentary.), appetite change and fatigue. Negative for fever, diaphoresis and unexpected weight change.  HENT: Positive for hearing loss. Negative for congestion, ear discharge, ear pain, mouth sores, sore throat and trouble swallowing.   Eyes: Negative.   Respiratory: Negative for cough, chest tightness, shortness of breath and wheezing.   Cardiovascular: Negative for chest pain, palpitations and leg swelling.  Gastrointestinal: Positive for nausea (variable occurrence usual after she eats. No vomiting.). Negative for abdominal pain, diarrhea, constipation and abdominal distention.  Endocrine:       Compensated hypothyroidism  Genitourinary: Negative.   Musculoskeletal: Positive for myalgias.       Multiple uncomfortable joints. No inflammation or joint swelling.  Skin: Negative.   Neurological: Positive for weakness (generalized).  Psychiatric/Behavioral: Positive for sleep disturbance. Negative for suicidal ideas and hallucinations. The patient is nervous/anxious. The patient is not hyperactive.     Filed Vitals:   11/11/15 1357  BP: 90/60  Pulse: 90  Temp: 98.2 F (36.8 C)  TempSrc: Oral  Resp: 20  Height: 5' 4" (1.626 m)  Weight: 149 lb 3.2 oz (67.677 kg)  SpO2: 91%   Body mass index is 25.6 kg/(m^2). Filed Weights   11/11/15 1357  Weight: 149 lb 3.2 oz (67.677 kg)     Physical Exam  Constitutional: She is oriented to person, place, and time. She appears well-developed  and well-nourished. No distress.  Moderately overweight  HENT:  Head: Normocephalic and atraumatic.  Right Ear: External ear normal.  Left Ear: External ear normal.  Nose: Nose normal.  Mouth/Throat: Oropharynx is clear and moist.  Eyes: Conjunctivae and EOM are normal. Pupils are equal, round, and reactive to light.  Neck: No JVD present. No tracheal deviation present. No thyromegaly present.  Cardiovascular: Regular rhythm, normal heart sounds and intact distal pulses.  Exam reveals  no gallop and no friction rub.   No murmur heard. Mild tachcardia.  Pulmonary/Chest: No respiratory distress. She has no wheezes. She has no rales. She exhibits no tenderness.  Abdominal: She exhibits no distension and no mass. There is no tenderness.  Musculoskeletal: Normal range of motion. She exhibits no edema or tenderness.  Lymphadenopathy:    She has no cervical adenopathy.  Neurological: She is alert and oriented to person, place, and time. She has normal reflexes. No cranial nerve deficit. Coordination normal.  Skin: No rash noted. No erythema. No pallor.  Painful callus on the bottom of the right foot.  Psychiatric: She has a normal mood and affect. Her behavior is normal. Judgment and thought content normal.    Labs reviewed: Lab Summary Latest Ref Rng 05/09/2015 03/26/2014  Hemoglobin 13.0-17.0 g/dL (None) (None)  Hematocrit 39.0-52.0 % (None) (None)  White count - (None) (None)  Platelet count - (None) (None)  Sodium 134 - 144 mmol/L 132(L) 131(L)  Potassium 3.5 - 5.2 mmol/L 4.5 4.5  Calcium 8.7 - 10.3 mg/dL 8.6(L) 8.4(L)  Phosphorus - (None) (None)  Creatinine 0.57 - 1.00 mg/dL 0.67 0.93  AST 0 - 40 IU/L 13 (None)  Alk Phos 39 - 117 IU/L 48 (None)  Bilirubin 0.0 - 1.2 mg/dL 0.4 (None)  Glucose 65 - 99 mg/dL 87 92  Cholesterol - (None) (None)  HDL cholesterol - (None) (None)  Triglycerides - (None) (None)  LDL Direct - (None) (None)  LDL Calc - (None) (None)  Total protein - (None) (None)  Albumin 3.6 - 4.8 g/dL 3.5(L) (None)   Lab Results  Component Value Date   TSH 2.040 05/09/2015   TSH 2.950 01/14/2014   TSH 4.540* 03/05/2013   Lab Results  Component Value Date   BUN 6* 05/09/2015   BUN 7* 03/26/2014   BUN 8 01/14/2014   No results found for: HGBA1C  Assessment/Plan  1. Fibromyalgia Encouraged increase in activity. No change in medicines.  2. Essential hypertension - CMP  3. Edema, unspecified type Improved  4. Hypothyroidism, unspecified  hypothyroidism type - TSH  5. Hyponatremia - CMP

## 2015-11-12 ENCOUNTER — Other Ambulatory Visit: Payer: Self-pay | Admitting: *Deleted

## 2015-11-12 DIAGNOSIS — M797 Fibromyalgia: Secondary | ICD-10-CM

## 2015-11-12 LAB — COMPREHENSIVE METABOLIC PANEL
ALBUMIN: 3.9 g/dL (ref 3.5–4.8)
ALK PHOS: 51 IU/L (ref 39–117)
ALT: 7 IU/L (ref 0–32)
AST: 10 IU/L (ref 0–40)
Albumin/Globulin Ratio: 2.1 (ref 1.1–2.5)
BUN / CREAT RATIO: 12 (ref 11–26)
BUN: 9 mg/dL (ref 8–27)
Bilirubin Total: 0.2 mg/dL (ref 0.0–1.2)
CALCIUM: 8.8 mg/dL (ref 8.7–10.3)
CHLORIDE: 89 mmol/L — AB (ref 96–106)
CO2: 21 mmol/L (ref 18–29)
CREATININE: 0.77 mg/dL (ref 0.57–1.00)
GFR calc Af Amer: 90 mL/min/{1.73_m2} (ref >59)
GFR calc non Af Amer: 78 mL/min/{1.73_m2} (ref >59)
Globulin, Total: 1.9 g/dL (ref 1.5–4.5)
Glucose: 99 mg/dL (ref 65–99)
Potassium: 4.4 mmol/L (ref 3.5–5.2)
Sodium: 130 mmol/L — ABNORMAL LOW (ref 134–144)
TOTAL PROTEIN: 5.8 g/dL — AB (ref 6.0–8.5)

## 2015-11-12 LAB — TSH: TSH: 1.68 u[IU]/mL (ref 0.450–4.500)

## 2015-11-12 MED ORDER — OXYCODONE HCL 30 MG PO TABS: ORAL_TABLET | ORAL | Status: DC

## 2015-11-12 NOTE — Telephone Encounter (Signed)
Patient seen in office since encounter created

## 2015-11-12 NOTE — Telephone Encounter (Signed)
Patient requested and will pick up. Going out of town for the weekend

## 2015-11-19 ENCOUNTER — Other Ambulatory Visit: Payer: Self-pay | Admitting: Internal Medicine

## 2015-12-12 ENCOUNTER — Telehealth: Payer: Self-pay | Admitting: *Deleted

## 2015-12-15 ENCOUNTER — Other Ambulatory Visit: Payer: Self-pay | Admitting: *Deleted

## 2015-12-15 DIAGNOSIS — M797 Fibromyalgia: Secondary | ICD-10-CM

## 2015-12-15 MED ORDER — OXYCODONE HCL 30 MG PO TABS: ORAL_TABLET | ORAL | Status: DC

## 2015-12-15 NOTE — Telephone Encounter (Signed)
Gave prescription to son Boyce MediciDavid Flowers

## 2015-12-16 NOTE — Telephone Encounter (Signed)
error 

## 2015-12-30 ENCOUNTER — Other Ambulatory Visit: Payer: Self-pay | Admitting: Internal Medicine

## 2016-01-13 ENCOUNTER — Other Ambulatory Visit: Payer: Self-pay | Admitting: *Deleted

## 2016-01-13 DIAGNOSIS — M797 Fibromyalgia: Secondary | ICD-10-CM

## 2016-01-13 MED ORDER — OXYCODONE HCL 30 MG PO TABS: ORAL_TABLET | ORAL | Status: DC

## 2016-01-13 NOTE — Telephone Encounter (Signed)
Patient requested and son, Lyda PeroneKirk will pick up at his appointment today.

## 2016-02-10 ENCOUNTER — Other Ambulatory Visit: Payer: Self-pay | Admitting: Internal Medicine

## 2016-02-12 ENCOUNTER — Other Ambulatory Visit: Payer: Self-pay

## 2016-02-12 DIAGNOSIS — M797 Fibromyalgia: Secondary | ICD-10-CM

## 2016-02-12 MED ORDER — OXYCODONE HCL 30 MG PO TABS: ORAL_TABLET | ORAL | Status: DC

## 2016-02-12 NOTE — Telephone Encounter (Signed)
Patient was notified that prescription is ready to be picked up.  

## 2016-02-16 ENCOUNTER — Telehealth: Payer: Self-pay

## 2016-02-16 MED ORDER — FIRST-DUKES MOUTHWASH MT SUSP: OROMUCOSAL | Status: DC

## 2016-02-16 MED ORDER — NYSTATIN 100000 UNIT/ML MT SUSP: OROMUCOSAL | Status: DC

## 2016-02-16 NOTE — Telephone Encounter (Signed)
Patient's husband was seen in office for yeast in mouth and states the wife has the same concerns  Please advise

## 2016-02-16 NOTE — Addendum Note (Signed)
Addended by: Nelda SevereMAY, ANITA A on: 02/16/2016 04:47 PM   Modules accepted: Orders, Medications

## 2016-02-16 NOTE — Telephone Encounter (Signed)
Please change to nystatin mouth wash 100,000 units/ml. 5 ML 4 times daily swish and spit

## 2016-02-16 NOTE — Addendum Note (Signed)
Addended by: Nelda SevereMAY, Genevra Orne A on: 02/16/2016 04:46 PM   Modules accepted: Orders

## 2016-02-16 NOTE — Telephone Encounter (Signed)
Patient notified and agree.  

## 2016-02-18 ENCOUNTER — Other Ambulatory Visit: Payer: Self-pay | Admitting: *Deleted

## 2016-02-18 MED ORDER — NYSTATIN 100000 UNIT/ML MT SUSP: OROMUCOSAL | Status: DC

## 2016-02-18 NOTE — Telephone Encounter (Signed)
Patient ran out of mouthwash just after 2 days and needed a refill due to it not being resolved yet.

## 2016-03-12 ENCOUNTER — Other Ambulatory Visit: Payer: Self-pay

## 2016-03-12 DIAGNOSIS — M797 Fibromyalgia: Secondary | ICD-10-CM

## 2016-03-12 MED ORDER — OXYCODONE HCL 30 MG PO TABS: ORAL_TABLET | ORAL | Status: DC

## 2016-03-17 ENCOUNTER — Telehealth: Payer: Self-pay

## 2016-03-17 ENCOUNTER — Other Ambulatory Visit: Payer: Self-pay | Admitting: Internal Medicine

## 2016-03-17 DIAGNOSIS — Z1231 Encounter for screening mammogram for malignant neoplasm of breast: Secondary | ICD-10-CM

## 2016-03-17 NOTE — Telephone Encounter (Signed)
Called patient to let her know that Dr. Chilton SiGreen received notice that she has not had mammogram since 03/02/2013. She knew this, she just didn't want to get it. [facility], gave patient the phone number.

## 2016-03-24 ENCOUNTER — Ambulatory Visit: Payer: Medicare Other

## 2016-03-27 ENCOUNTER — Other Ambulatory Visit: Payer: Self-pay | Admitting: Internal Medicine

## 2016-04-05 ENCOUNTER — Ambulatory Visit
Admission: RE | Admit: 2016-04-05 | Discharge: 2016-04-05 | Disposition: A | Discharge status: Home / Self Care | Payer: Medicare Other | Source: Ambulatory Visit | Attending: Internal Medicine | Admitting: Internal Medicine

## 2016-04-05 DIAGNOSIS — Z1231 Encounter for screening mammogram for malignant neoplasm of breast: Secondary | ICD-10-CM | POA: Diagnosis not present

## 2016-04-07 ENCOUNTER — Other Ambulatory Visit: Payer: Self-pay | Admitting: Internal Medicine

## 2016-04-07 DIAGNOSIS — R928 Other abnormal and inconclusive findings on diagnostic imaging of breast: Secondary | ICD-10-CM

## 2016-04-09 ENCOUNTER — Other Ambulatory Visit: Payer: Self-pay | Admitting: *Deleted

## 2016-04-09 DIAGNOSIS — M797 Fibromyalgia: Secondary | ICD-10-CM

## 2016-04-09 MED ORDER — OXYCODONE HCL 30 MG PO TABS: ORAL_TABLET | ORAL | 0 refills | Status: DC

## 2016-04-09 NOTE — Telephone Encounter (Signed)
Patient requested and will pick up 

## 2016-04-13 ENCOUNTER — Other Ambulatory Visit: Payer: Medicare Other

## 2016-04-15 ENCOUNTER — Ambulatory Visit
Admission: RE | Admit: 2016-04-15 | Discharge: 2016-04-15 | Disposition: A | Discharge status: Home / Self Care | Payer: Medicare Other | Source: Ambulatory Visit | Attending: Internal Medicine | Admitting: Internal Medicine

## 2016-04-15 DIAGNOSIS — R922 Inconclusive mammogram: Secondary | ICD-10-CM | POA: Diagnosis not present

## 2016-04-15 DIAGNOSIS — R928 Other abnormal and inconclusive findings on diagnostic imaging of breast: Secondary | ICD-10-CM

## 2016-04-16 ENCOUNTER — Other Ambulatory Visit: Payer: Medicare Other

## 2016-05-11 ENCOUNTER — Other Ambulatory Visit: Payer: Self-pay | Admitting: *Deleted

## 2016-05-11 ENCOUNTER — Telehealth: Payer: Self-pay

## 2016-05-11 DIAGNOSIS — M797 Fibromyalgia: Secondary | ICD-10-CM

## 2016-05-11 MED ORDER — OXYCODONE HCL 30 MG PO TABS: ORAL_TABLET | ORAL | 0 refills | Status: DC

## 2016-05-11 NOTE — Telephone Encounter (Signed)
Called patient to let her know that prescription for oxycodone 30 mg tablets is ready to pick up. She stated that she would pick it up tomorrow. Prescription was placed in filing cabinet at front desk.

## 2016-05-11 NOTE — Telephone Encounter (Signed)
Patient requested and will pick up 

## 2016-05-18 ENCOUNTER — Ambulatory Visit: Payer: Medicare Other | Admitting: Internal Medicine

## 2016-06-10 ENCOUNTER — Other Ambulatory Visit: Payer: Self-pay

## 2016-06-10 DIAGNOSIS — M797 Fibromyalgia: Secondary | ICD-10-CM

## 2016-06-10 MED ORDER — OXYCODONE HCL 30 MG PO TABS: ORAL_TABLET | ORAL | 0 refills | Status: DC

## 2016-06-10 NOTE — Telephone Encounter (Signed)
Spoke with patient and advised rx ready for pick-up and it will be at the front desk.  

## 2016-06-23 ENCOUNTER — Ambulatory Visit (INDEPENDENT_AMBULATORY_CARE_PROVIDER_SITE_OTHER): Payer: Medicare Other | Admitting: Internal Medicine

## 2016-06-23 ENCOUNTER — Encounter: Payer: Self-pay | Admitting: Internal Medicine

## 2016-06-23 VITALS — BP 100/62 | HR 95 | Temp 97.7°F | Ht 64.0 in | Wt 144.0 lb

## 2016-06-23 DIAGNOSIS — L84 Corns and callosities: Secondary | ICD-10-CM

## 2016-06-23 DIAGNOSIS — I10 Essential (primary) hypertension: Secondary | ICD-10-CM | POA: Diagnosis not present

## 2016-06-23 DIAGNOSIS — R739 Hyperglycemia, unspecified: Secondary | ICD-10-CM

## 2016-06-23 DIAGNOSIS — E871 Hypo-osmolality and hyponatremia: Secondary | ICD-10-CM

## 2016-06-23 DIAGNOSIS — F3175 Bipolar disorder, in partial remission, most recent episode depressed: Secondary | ICD-10-CM | POA: Diagnosis not present

## 2016-06-23 DIAGNOSIS — R609 Edema, unspecified: Secondary | ICD-10-CM

## 2016-06-23 DIAGNOSIS — M797 Fibromyalgia: Secondary | ICD-10-CM

## 2016-06-23 DIAGNOSIS — R110 Nausea: Secondary | ICD-10-CM

## 2016-06-23 DIAGNOSIS — E039 Hypothyroidism, unspecified: Secondary | ICD-10-CM

## 2016-06-23 MED ORDER — ONDANSETRON HCL 4 MG PO TABS: ORAL_TABLET | ORAL | 4 refills | Status: DC

## 2016-06-23 NOTE — Progress Notes (Signed)
Facility  [facility]    Place of Service:   OFFICE    No Known Allergies  Chief Complaint  Patient presents with  . Medical Management of Chronic Issues    6 month medication management blood pressure, thyroid, Fibromyalgia. Here with husband  . Fibromylalgia    would like to talk with Dr. Nyoka Cowden about Elavil, a friend recommend. Questions about Fibromylalgia.     HPI:  Essential hypertension - controlled  Hyperglycemia - controlled  Hypothyroidism, unspecified type - compensated  Hyponatremia - resolved  Fibromyalgia - continues to have diffuse and migratory myalgias. This has been a devastating problem for heer and she still has days that are difficult to get anything done,.   Edema, unspecified type - resolved  Bipolar disorder, in partial remission, most recent episode depressed ([facility]) - stable on current meds    Medications: Patient's Medications  New Prescriptions   No medications on file  Previous Medications   ANTIPYRINE-BENZOCAINE (AURALGAN) OTIC SOLUTION    2-3 drops in painful ear every 2 hours as needed   FLUOXETINE (PROZAC) 40 MG CAPSULE    TAKE 1 CAPSULE BY MOUTH DAILY TO HELP NERVES AND DEPRESSION   LEVOTHYROXINE (SYNTHROID, LEVOTHROID) 25 MCG TABLET    TAKE 1 TABLET BY MOUTH EVERY DAY   LYRICA 75 MG CAPSULE    TAKE ONE CAPSULE BY MOUTH TWICE A DAY TO HELP WITH PAIN   NEXIUM 40 MG CAPSULE    TAKE ONE CAPSULE BY MOUTH DAILY TO REDUCE STOMACH ACID   NYSTATIN (MYCOSTATIN) 100000 UNIT/ML SUSPENSION    74m by mouth four times daily, swish and spit   OXYCODONE (ROXICODONE) 30 MG IMMEDIATE RELEASE TABLET    Take one tablet every 3 hours as needed for pain.   RISPERIDONE (RISPERDAL) 1 MG TABLET    TAKE 1 TABLET BY MOUTH TWICE A DAY TO HELP RELAX  Modified Medications   No medications on file  Discontinued Medications   No medications on file    Review of Systems  Constitutional: Positive for activity change (sedentary.), appetite change and fatigue. Negative  for diaphoresis, fever and unexpected weight change.  HENT: Positive for hearing loss. Negative for congestion, ear discharge, ear pain, mouth sores, sore throat and trouble swallowing.   Eyes: Negative.   Respiratory: Negative for cough, chest tightness, shortness of breath and wheezing.   Cardiovascular: Negative for chest pain, palpitations and leg swelling.  Gastrointestinal: Positive for nausea (variable occurrence usual after she eats. No vomiting.). Negative for abdominal distention, abdominal pain, constipation and diarrhea.  Endocrine:       Compensated hypothyroidism  Genitourinary: Negative.   Musculoskeletal: Positive for myalgias.       Multiple uncomfortable joints. No inflammation or joint swelling.  Skin: Negative.   Neurological: Positive for weakness (generalized).  Psychiatric/Behavioral: Positive for sleep disturbance. Negative for hallucinations and suicidal ideas. The patient is nervous/anxious. The patient is not hyperactive.     Vitals:   06/23/16 1114  BP: 100/62  Pulse: 95  Temp: 97.7 F (36.5 C)  TempSrc: Oral  SpO2: 97%  Weight: 144 lb (65.3 kg)  Height: '5\' 4"'$  (1.626 m)   Body mass index is 24.72 kg/m. Wt Readings from Last 3 Encounters:  06/23/16 144 lb (65.3 kg)  11/11/15 149 lb 3.2 oz (67.7 kg)  05/13/15 152 lb (68.9 kg)      Physical Exam  Constitutional: She is oriented to person, place, and time. She appears well-developed and well-nourished. No distress.  Moderately overweight  HENT:  Head: Normocephalic and atraumatic.  Right Ear: External ear normal.  Left Ear: External ear normal.  Nose: Nose normal.  Mouth/Throat: Oropharynx is clear and moist.  Eyes: Conjunctivae and EOM are normal. Pupils are equal, round, and reactive to light.  Neck: No JVD present. No tracheal deviation present. No thyromegaly present.  Cardiovascular: Regular rhythm, normal heart sounds and intact distal pulses.  Exam reveals no gallop and no friction rub.     No murmur heard. Mild tachcardia.  Pulmonary/Chest: No respiratory distress. She has no wheezes. She has no rales. She exhibits no tenderness.  Abdominal: She exhibits no distension and no mass. There is no tenderness.  Musculoskeletal: Normal range of motion. She exhibits no edema or tenderness.  Lymphadenopathy:    She has no cervical adenopathy.  Neurological: She is alert and oriented to person, place, and time. She has normal reflexes. No cranial nerve deficit. Coordination normal.  Skin: No rash noted. No erythema. No pallor.  Painful callus on the bottom of the right foot.  Psychiatric: She has a normal mood and affect. Her behavior is normal. Judgment and thought content normal.    Labs reviewed: Lab Summary Latest Ref Rng & Units 11/11/2015 05/09/2015  Hemoglobin 13.0-17.0 g/dL (None) (None)  Hematocrit 39.0-52.0 % (None) (None)  White count - (None) (None)  Platelet count - (None) (None)  Sodium 134 - 144 mmol/L 130(L) 132(L)  Potassium 3.5 - 5.2 mmol/L 4.4 4.5  Calcium 8.7 - 10.3 mg/dL 8.8 8.6(L)  Phosphorus - (None) (None)  Creatinine 0.57 - 1.00 mg/dL 0.77 0.67  AST 0 - 40 IU/L 10 13  Alk Phos 39 - 117 IU/L 51 48  Bilirubin 0.0 - 1.2 mg/dL 0.2 0.4  Glucose 65 - 99 mg/dL 99 87  Cholesterol - (None) (None)  HDL cholesterol - (None) (None)  Triglycerides - (None) (None)  LDL Direct - (None) (None)  LDL Calc - (None) (None)  Total protein - (None) (None)  Albumin 3.5 - 4.8 g/dL 3.9 3.5(L)  Some recent data might be hidden   Lab Results  Component Value Date   TSH 1.680 11/11/2015   TSH 2.040 05/09/2015   TSH 2.950 01/14/2014   Lab Results  Component Value Date   BUN 9 11/11/2015   BUN 6 (L) 05/09/2015   BUN 7 (L) 03/26/2014   No results found for: HGBA1C  Assessment/Plan  1. Essential hypertension controlled  2. Hyperglycemia controlled  3. Hypothyroidism, unspecified type compensated  4. Hyponatremia resolved  5. Fibromyalgia Discussed Elavil.  I thnk it has enough potential side effects that I am reluctant to prescibe it. She already has issues with dry mouth, constipation, and sluggishness. In addition her age puts her in a category that we should usually avoid this medication.  6. Edema, unspecified type resolved  7. Bipolar disorder, in partial remission, most recent episode depressed ([place]) stable  8. Nausea without vomiting - ondansetron (ZOFRAN) 4 MG tablet; One every 8 hours if needed for nausea  Dispense: 30 tablet; Refill: 4  9. Corn Sharply debrided and removed

## 2016-07-06 ENCOUNTER — Other Ambulatory Visit: Payer: Self-pay | Admitting: Internal Medicine

## 2016-07-07 ENCOUNTER — Other Ambulatory Visit: Payer: Self-pay | Admitting: Internal Medicine

## 2016-07-09 ENCOUNTER — Other Ambulatory Visit: Payer: Self-pay | Admitting: *Deleted

## 2016-07-09 DIAGNOSIS — M797 Fibromyalgia: Secondary | ICD-10-CM

## 2016-07-09 MED ORDER — OXYCODONE HCL 30 MG PO TABS: ORAL_TABLET | ORAL | 0 refills | Status: DC

## 2016-07-09 NOTE — Telephone Encounter (Signed)
Patient requested and will pick up 

## 2016-08-06 ENCOUNTER — Other Ambulatory Visit: Payer: Self-pay | Admitting: *Deleted

## 2016-08-06 DIAGNOSIS — M797 Fibromyalgia: Secondary | ICD-10-CM

## 2016-08-06 MED ORDER — OXYCODONE HCL 30 MG PO TABS: ORAL_TABLET | ORAL | 0 refills | Status: DC

## 2016-08-06 NOTE — Telephone Encounter (Signed)
Patient requested and will pick up 

## 2016-09-03 ENCOUNTER — Other Ambulatory Visit: Payer: Self-pay | Admitting: *Deleted

## 2016-09-03 DIAGNOSIS — M797 Fibromyalgia: Secondary | ICD-10-CM

## 2016-09-03 MED ORDER — OXYCODONE HCL 30 MG PO TABS: ORAL_TABLET | ORAL | 0 refills | Status: DC

## 2016-09-03 NOTE — Telephone Encounter (Signed)
Patient requested and will pick up 

## 2016-10-01 ENCOUNTER — Other Ambulatory Visit: Payer: Self-pay | Admitting: *Deleted

## 2016-10-01 DIAGNOSIS — M797 Fibromyalgia: Secondary | ICD-10-CM

## 2016-10-01 MED ORDER — OXYCODONE HCL 30 MG PO TABS: ORAL_TABLET | ORAL | 0 refills | Status: DC

## 2016-10-01 NOTE — Telephone Encounter (Signed)
Patient requested and will pick up 

## 2016-10-25 ENCOUNTER — Other Ambulatory Visit: Payer: Self-pay | Admitting: Nurse Practitioner

## 2016-11-04 ENCOUNTER — Other Ambulatory Visit: Payer: Self-pay

## 2016-11-04 ENCOUNTER — Telehealth: Payer: Self-pay

## 2016-11-04 DIAGNOSIS — M797 Fibromyalgia: Secondary | ICD-10-CM

## 2016-11-04 MED ORDER — OXYCODONE HCL 30 MG PO TABS: ORAL_TABLET | ORAL | 0 refills | Status: DC

## 2016-11-04 NOTE — Telephone Encounter (Signed)
Left message for patient to notify patient that prescription is ready for pickup

## 2016-11-11 ENCOUNTER — Telehealth: Payer: Self-pay | Admitting: *Deleted

## 2016-11-11 NOTE — Telephone Encounter (Signed)
I do not know what else to recommend. I do not want to increase her pain medications.

## 2016-11-11 NOTE — Telephone Encounter (Signed)
Husband, Sarah Miranda called and stated that patient is in a lot of pain at night, so much that she hollers out "please Jesus, take the pain away." This upsets the husband to see her in that much pain. He wonders is there anything they can do for this or anything she can take. Please Advise.

## 2016-11-11 NOTE — Telephone Encounter (Signed)
I would like her to see Dr. Vear ClockPhillips in his pain specialty clinic.

## 2016-11-11 NOTE — Telephone Encounter (Signed)
Patient refused pain clinic. Does not want to go.

## 2016-11-29 ENCOUNTER — Other Ambulatory Visit: Payer: Self-pay | Admitting: Internal Medicine

## 2016-11-29 DIAGNOSIS — R110 Nausea: Secondary | ICD-10-CM

## 2016-12-02 ENCOUNTER — Other Ambulatory Visit: Payer: Self-pay

## 2016-12-02 ENCOUNTER — Telehealth: Payer: Self-pay | Admitting: Internal Medicine

## 2016-12-02 DIAGNOSIS — M797 Fibromyalgia: Secondary | ICD-10-CM

## 2016-12-02 MED ORDER — OXYCODONE HCL 30 MG PO TABS: ORAL_TABLET | ORAL | 0 refills | Status: DC

## 2016-12-02 NOTE — Telephone Encounter (Signed)
Called patient to her know that her prescription is ready for pick up at the front desk.

## 2016-12-14 ENCOUNTER — Ambulatory Visit: Payer: Medicare Other | Admitting: Internal Medicine

## 2016-12-15 ENCOUNTER — Telehealth: Payer: Self-pay | Admitting: *Deleted

## 2016-12-15 NOTE — Telephone Encounter (Signed)
Patient husband, Annette Stable walked in and gave Forest Becker a letter to give to me and it stated: "Dr. Chilton Si, Desperately searching for something to help Trude Cansler in her on going battle with Fibromyalgia. The following has been suggested as daily supplements: D-ribose Magnesium Coenzyme Q10 High dose of Vitamin D3 (10,000 to 15,000 IU per day) Vitamin K2 (MK-7)"  Patient husband wants to know if these are ok with you for her to take and what dosages? Please Advise.

## 2016-12-15 NOTE — Telephone Encounter (Signed)
I havre no experience with using these for fibromyalgia. I have no objection to the use except for the high dose of Vitamin D. It should not be used at more than 5000 units per day. I don't know doses for the other drugs , but the packages will suggest a reasonable dose.

## 2016-12-16 NOTE — Telephone Encounter (Signed)
Forwarded to Chrae since I'm at Facility 

## 2016-12-16 NOTE — Telephone Encounter (Signed)
Spoke with Mr.Reas, Mr.Faughn verbalized understanding of Dr.Green's response.

## 2016-12-22 ENCOUNTER — Ambulatory Visit (INDEPENDENT_AMBULATORY_CARE_PROVIDER_SITE_OTHER): Payer: Medicare Other | Admitting: Internal Medicine

## 2016-12-22 ENCOUNTER — Encounter: Payer: Self-pay | Admitting: Internal Medicine

## 2016-12-22 VITALS — BP 124/62 | HR 103 | Temp 98.7°F | Ht 64.0 in | Wt 142.2 lb

## 2016-12-22 DIAGNOSIS — R5381 Other malaise: Secondary | ICD-10-CM | POA: Diagnosis not present

## 2016-12-22 DIAGNOSIS — I10 Essential (primary) hypertension: Secondary | ICD-10-CM | POA: Diagnosis not present

## 2016-12-22 DIAGNOSIS — F3175 Bipolar disorder, in partial remission, most recent episode depressed: Secondary | ICD-10-CM

## 2016-12-22 DIAGNOSIS — M797 Fibromyalgia: Secondary | ICD-10-CM

## 2016-12-22 DIAGNOSIS — E871 Hypo-osmolality and hyponatremia: Secondary | ICD-10-CM | POA: Diagnosis not present

## 2016-12-22 DIAGNOSIS — R609 Edema, unspecified: Secondary | ICD-10-CM

## 2016-12-22 DIAGNOSIS — R739 Hyperglycemia, unspecified: Secondary | ICD-10-CM

## 2016-12-22 DIAGNOSIS — E039 Hypothyroidism, unspecified: Secondary | ICD-10-CM

## 2016-12-22 LAB — COMPREHENSIVE METABOLIC PANEL
ALT: 6 U/L (ref 6–29)
AST: 12 U/L (ref 10–35)
Albumin: 3.5 g/dL — ABNORMAL LOW (ref 3.6–5.1)
Alkaline Phosphatase: 47 U/L (ref 33–130)
BUN: 10 mg/dL (ref 7–25)
CHLORIDE: 96 mmol/L — AB (ref 98–110)
CO2: 26 mmol/L (ref 20–31)
Calcium: 8.9 mg/dL (ref 8.6–10.4)
Creat: 0.83 mg/dL (ref 0.60–0.93)
GLUCOSE: 87 mg/dL (ref 65–99)
POTASSIUM: 4.9 mmol/L (ref 3.5–5.3)
Sodium: 131 mmol/L — ABNORMAL LOW (ref 135–146)
Total Bilirubin: 0.3 mg/dL (ref 0.2–1.2)
Total Protein: 5.6 g/dL — ABNORMAL LOW (ref 6.1–8.1)

## 2016-12-22 MED ORDER — LEVOMILNACIPRAN HCL ER 80 MG PO CP24: ORAL_CAPSULE | ORAL | 0 refills | Status: DC

## 2016-12-22 NOTE — Progress Notes (Signed)
Facility  [facility]    Place of Service:   OFFICE    No Known Allergies  Chief Complaint  Patient presents with  . Medical Management of Chronic Issues    Medical Management of BP, Thyroid and Fibromyalgia     HPI:  Fibromyalgia - has had some miserable days recently. Lays on the couch all day. Pain is uncontrolled . Affected by weather. Has quit the Lyrica because of the swelling it induced.  Hyperglycemia - need s lab  Essential hypertension - controlled  Edema, unspecified type - resolved off Lyrica.  Bipolar disorder, in partial remission, most recent episode depressed ([place]) - continues on Prozac and risperidone.  Hyponatremia - Plan: Comprehensive metabolic panel  Hypothyroidism, unspecified type - Plan: TSH  Malaise - likely associated with bipolar disorder and fibromyalgia.    Medications: Patient's Medications  New Prescriptions   No medications on file  Previous Medications   FLUOXETINE (PROZAC) 40 MG CAPSULE    TAKE 1 CAPSULE BY MOUTH DAILY TO HELP NERVES AND DEPRESSION   LEVOTHYROXINE (SYNTHROID, LEVOTHROID) 25 MCG TABLET    TAKE 1 TABLET BY MOUTH EVERY DAY   LYRICA 75 MG CAPSULE    TAKE ONE CAPSULE BY MOUTH TWICE A DAY TO HELP WITH PAIN   NEXIUM 40 MG CAPSULE    TAKE ONE CAPSULE BY MOUTH DAILY TO REDUCE STOMACH ACID   NYSTATIN (MYCOSTATIN) 100000 UNIT/ML SUSPENSION    49m by mouth four times daily, swish and spit   ONDANSETRON (ZOFRAN) 4 MG TABLET    TAKE 1 TABLET BY MOUTH EVERY 8 HOURS AS NEEDED FOR NAUSEA   OXYCODONE (ROXICODONE) 30 MG IMMEDIATE RELEASE TABLET    Take one tablet every 3 hours as needed for pain.   RISPERIDONE (RISPERDAL) 1 MG TABLET    TAKE 1 TABLET BY MOUTH TWICE A DAY TO HELP RELAX  Modified Medications   No medications on file  Discontinued Medications   ANTIPYRINE-BENZOCAINE (AURALGAN) OTIC SOLUTION    2-3 drops in painful ear every 2 hours as needed    Review of Systems  Constitutional: Positive for activity change (sedentary.),  appetite change and fatigue. Negative for diaphoresis, fever and unexpected weight change.  HENT: Positive for hearing loss. Negative for congestion, ear discharge, ear pain, mouth sores, sore throat and trouble swallowing.   Eyes: Negative.   Respiratory: Negative for cough, chest tightness, shortness of breath and wheezing.   Cardiovascular: Negative for chest pain, palpitations and leg swelling.  Gastrointestinal: Positive for nausea (variable occurrence usual after she eats. No vomiting.). Negative for abdominal distention, abdominal pain, constipation and diarrhea.  Endocrine:       Compensated hypothyroidism  Genitourinary: Negative.   Musculoskeletal: Positive for myalgias.       Multiple uncomfortable joints. No inflammation or joint swelling.  Skin: Negative.   Neurological: Positive for weakness (generalized).  Psychiatric/Behavioral: Positive for sleep disturbance. Negative for hallucinations and suicidal ideas. The patient is nervous/anxious. The patient is not hyperactive.     Vitals:   12/22/16 1305  BP: 124/62  Pulse: (!) 103  Temp: 98.7 F (37.1 C)  TempSrc: Oral  SpO2: 98%  Weight: 142 lb 3.2 oz (64.5 kg)  Height: '5\' 4"'$  (1.626 m)   Body mass index is 24.41 kg/m. Wt Readings from Last 3 Encounters:  12/22/16 142 lb 3.2 oz (64.5 kg)  06/23/16 144 lb (65.3 kg)  11/11/15 149 lb 3.2 oz (67.7 kg)      Physical Exam  Constitutional:  She is oriented to person, place, and time. She appears well-developed and well-nourished. No distress.  Moderately overweight  HENT:  Head: Normocephalic and atraumatic.  Right Ear: External ear normal.  Left Ear: External ear normal.  Nose: Nose normal.  Mouth/Throat: Oropharynx is clear and moist.  Eyes: Conjunctivae and EOM are normal. Pupils are equal, round, and reactive to light.  Neck: No JVD present. No tracheal deviation present. No thyromegaly present.  Cardiovascular: Regular rhythm, normal heart sounds and intact  distal pulses.  Exam reveals no gallop and no friction rub.   No murmur heard. Mild tachcardia.  Pulmonary/Chest: No respiratory distress. She has no wheezes. She has no rales. She exhibits no tenderness.  Abdominal: She exhibits no distension and no mass. There is no tenderness.  Musculoskeletal: Normal range of motion. She exhibits no edema or tenderness.  Lymphadenopathy:    She has no cervical adenopathy.  Neurological: She is alert and oriented to person, place, and time. She has normal reflexes. No cranial nerve deficit. Coordination normal.  Skin: No rash noted. No erythema. No pallor.  Painful callus on the bottom of the right foot.  Psychiatric: She has a normal mood and affect. Her behavior is normal. Judgment and thought content normal.    Labs reviewed: Lab Summary Latest Ref Rng & Units 11/11/2015  Hemoglobin 13.0-17.0 g/dL (None)  Hematocrit 39.0-52.0 % (None)  White count - (None)  Platelet count - (None)  Sodium 134 - 144 mmol/L 130(L)  Potassium 3.5 - 5.2 mmol/L 4.4  Calcium 8.7 - 10.3 mg/dL 8.8  Phosphorus - (None)  Creatinine 0.57 - 1.00 mg/dL 0.77  AST 0 - 40 IU/L 10  Alk Phos 39 - 117 IU/L 51  Bilirubin 0.0 - 1.2 mg/dL 0.2  Glucose 65 - 99 mg/dL 99  Cholesterol - (None)  HDL cholesterol - (None)  Triglycerides - (None)  LDL Direct - (None)  LDL Calc - (None)  Total protein - (None)  Albumin 3.5 - 4.8 g/dL 3.9  Some recent data might be hidden   Lab Results  Component Value Date   TSH 1.680 11/11/2015   TSH 2.040 05/09/2015   TSH 2.950 01/14/2014   Lab Results  Component Value Date   BUN 9 11/11/2015   BUN 6 (L) 05/09/2015   BUN 7 (L) 03/26/2014   No results found for: HGBA1C  Assessment/Plan  1. Fibromyalgia Stop Lyrica - Levomilnacipran HCl ER (FETZIMA) 80 MG CP24; One daily to help fibromyalgia  Dispense: 28 capsule; Refill: 0  2. Hyperglycemia -CMP  3. Essential hypertension The current medical regimen is effective;  continue present  plan and medications. -CMP  4. Edema, unspecified type resolved  5. Bipolar disorder, in partial remission, most recent episode depressed ([place]) The current medical regimen is effective;  continue present plan and medications.  6. Hyponatremia - Comprehensive metabolic panel  7. Hypothyroidism, unspecified type - TSH  8. Malaise Linked to fibromyalgia and bipolar disorder.

## 2016-12-23 LAB — TSH: TSH: 2.12 m[IU]/L

## 2016-12-31 ENCOUNTER — Other Ambulatory Visit: Payer: Self-pay | Admitting: *Deleted

## 2016-12-31 DIAGNOSIS — M797 Fibromyalgia: Secondary | ICD-10-CM

## 2016-12-31 MED ORDER — OXYCODONE HCL 30 MG PO TABS: ORAL_TABLET | ORAL | 0 refills | Status: DC

## 2016-12-31 NOTE — Telephone Encounter (Signed)
Patient requested and will pick up 

## 2017-01-03 ENCOUNTER — Telehealth: Payer: Self-pay

## 2017-01-03 NOTE — Telephone Encounter (Signed)
Melia with [facility] called to indicate she needs to speak with Dr.Green regarding patient's oxycodone. Pharmacy is concerned this dose is way too high 

## 2017-01-10 ENCOUNTER — Other Ambulatory Visit: Payer: Self-pay | Admitting: Nurse Practitioner

## 2017-01-13 ENCOUNTER — Other Ambulatory Visit: Payer: Self-pay | Admitting: Internal Medicine

## 2017-01-13 DIAGNOSIS — M797 Fibromyalgia: Secondary | ICD-10-CM

## 2017-01-13 MED ORDER — OXYCODONE HCL 30 MG PO TABS: ORAL_TABLET | ORAL | 0 refills | Status: DC

## 2017-01-13 NOTE — Telephone Encounter (Signed)
Frequency reduced. Discussed with Sarah Miranda. 

## 2017-01-17 ENCOUNTER — Telehealth: Payer: Self-pay | Admitting: *Deleted

## 2017-01-17 NOTE — Telephone Encounter (Signed)
noted 

## 2017-01-17 NOTE — Telephone Encounter (Signed)
Patient called and stated that she can not take the [facility] because it makes her sick. She has stopped it and went back on her Lyrica. She wanted to let you know and have her chart updated.  Medication D/C and added to Allergy.

## 2017-01-18 NOTE — Telephone Encounter (Signed)
Does she need follow up in office?

## 2017-01-18 NOTE — Telephone Encounter (Signed)
Does not want to come in for an appointment. Just wants her medication list updated and to make Dr. Chilton SiGreen aware.

## 2017-02-08 ENCOUNTER — Telehealth: Payer: Self-pay | Admitting: *Deleted

## 2017-02-08 NOTE — Telephone Encounter (Signed)
Patient called and stated that she can't take the oxycodone every 6 hours, she stated that she can't hardly make it every 4 hours. Wants to go back to the original dose of every 3 hours. [facility] stated [facility] was because [facility] didn't know them because [facility] [place] [facility] [place], it wasn't their normal pharmacy. Please Advise.

## 2017-02-28 ENCOUNTER — Other Ambulatory Visit: Payer: Self-pay | Admitting: Internal Medicine

## 2017-03-04 ENCOUNTER — Other Ambulatory Visit: Payer: Self-pay | Admitting: *Deleted

## 2017-03-04 DIAGNOSIS — M797 Fibromyalgia: Secondary | ICD-10-CM

## 2017-03-04 MED ORDER — OXYCODONE HCL 30 MG PO TABS: ORAL_TABLET | ORAL | 0 refills | Status: DC

## 2017-03-04 NOTE — Telephone Encounter (Signed)
Patient requested and will pick up 

## 2017-03-07 ENCOUNTER — Ambulatory Visit: Payer: Medicare Other | Admitting: Internal Medicine

## 2017-04-01 ENCOUNTER — Other Ambulatory Visit: Payer: Self-pay | Admitting: *Deleted

## 2017-04-01 DIAGNOSIS — M797 Fibromyalgia: Secondary | ICD-10-CM

## 2017-04-01 MED ORDER — OXYCODONE HCL 30 MG PO TABS: ORAL_TABLET | ORAL | 0 refills | Status: DC

## 2017-04-01 NOTE — Telephone Encounter (Signed)
Patient requested and will pick up 

## 2017-04-29 ENCOUNTER — Other Ambulatory Visit: Payer: Self-pay | Admitting: *Deleted

## 2017-04-29 ENCOUNTER — Telehealth: Payer: Self-pay

## 2017-04-29 DIAGNOSIS — M797 Fibromyalgia: Secondary | ICD-10-CM

## 2017-04-29 MED ORDER — OXYCODONE HCL 30 MG PO TABS: ORAL_TABLET | ORAL | 0 refills | Status: DC

## 2017-04-29 NOTE — Telephone Encounter (Signed)
Patient requested and will pick up [place]CC[place] Database Checked.  

## 2017-04-29 NOTE — Telephone Encounter (Signed)
Patient states there is a problem with her Oxycodone rx instructions.  Patient states the pharmacy will not fill rx, RX will need to written 1 by mouth every 3 hours as needed vs 1 by mouth every 6 hours as needed.  I informed patient the pharmacy can not dictate how the provider writes prescriptions for its the providers license that is attached to the RX . Patient states she is aware of this and does not understand what [facility] is doing and we need to call them.   Patient's husband got on the phone and states in order for rx to be filled it will need a different instructions otherwise rx can not be filled until 05/05/17.   Patient has misplaced RX and would like to know if Dr.Reed will change rx and dispense enough medication to last until the original rx can be filled on 05/05/17  Please advise

## 2017-04-29 NOTE — Telephone Encounter (Signed)
I do not feel comfortable giving additional pills in this situation.  1. I have not met Mrs. Sarah Miranda.  2.  The prescription was written in the past by Dr. Nyoka Cowden as it was for a reason, I'm sure.  3.  [name] has expressed concerns about his wife's sleepiness and unsteadiness which will only be worsened by more frequent doses of oxycodone.  4.  Early refills are not part of the controlled substance contracts.

## 2017-04-29 NOTE — Telephone Encounter (Signed)
I called patient and patient was disappointed that rx instructions would not be changed yet she understands and will keep follow-up for Monday

## 2017-05-02 ENCOUNTER — Ambulatory Visit: Payer: Medicare Other | Admitting: Internal Medicine

## 2017-05-18 ENCOUNTER — Other Ambulatory Visit: Payer: Self-pay | Admitting: Internal Medicine

## 2017-05-18 DIAGNOSIS — R110 Nausea: Secondary | ICD-10-CM

## 2017-05-19 ENCOUNTER — Encounter: Payer: Self-pay | Admitting: Internal Medicine

## 2017-05-19 ENCOUNTER — Ambulatory Visit (INDEPENDENT_AMBULATORY_CARE_PROVIDER_SITE_OTHER): Payer: Medicare Other | Admitting: Internal Medicine

## 2017-05-19 VITALS — BP 110/60 | HR 105 | Temp 98.1°F | Wt 135.0 lb

## 2017-05-19 DIAGNOSIS — R739 Hyperglycemia, unspecified: Secondary | ICD-10-CM | POA: Diagnosis not present

## 2017-05-19 DIAGNOSIS — F3175 Bipolar disorder, in partial remission, most recent episode depressed: Secondary | ICD-10-CM | POA: Diagnosis not present

## 2017-05-19 DIAGNOSIS — Z79899 Other long term (current) drug therapy: Secondary | ICD-10-CM | POA: Diagnosis not present

## 2017-05-19 DIAGNOSIS — K5903 Drug induced constipation: Secondary | ICD-10-CM

## 2017-05-19 DIAGNOSIS — F5089 Other specified eating disorder: Secondary | ICD-10-CM | POA: Diagnosis not present

## 2017-05-19 DIAGNOSIS — T402X5A Adverse effect of other opioids, initial encounter: Secondary | ICD-10-CM

## 2017-05-19 DIAGNOSIS — R231 Pallor: Secondary | ICD-10-CM

## 2017-05-19 DIAGNOSIS — E039 Hypothyroidism, unspecified: Secondary | ICD-10-CM | POA: Diagnosis not present

## 2017-05-19 DIAGNOSIS — M797 Fibromyalgia: Secondary | ICD-10-CM

## 2017-05-19 MED ORDER — SENNA 8.6 MG PO TABS: 1.0000 | ORAL_TABLET | Freq: Every day | ORAL | 0 refills | Status: DC | PRN

## 2017-05-19 MED ORDER — PREGABALIN 75 MG PO CAPS: 75.0000 mg | ORAL_CAPSULE | Freq: Every day | ORAL | 0 refills | Status: DC

## 2017-05-19 NOTE — Patient Instructions (Signed)
Myofascial Pain Syndrome and Fibromyalgia Myofascial pain syndrome and fibromyalgia are both pain disorders. This pain may be felt mainly in your muscles.  Myofascial pain syndrome: ? Always has trigger points or tender points in the muscle that will cause pain when pressed. The pain may come and go. ? Usually affects your neck, upper back, and shoulder areas. The pain often radiates into your arms and hands.  Fibromyalgia: ? Has muscle pains and tenderness that come and go. ? Is often associated with fatigue and sleep disturbances. ? Has trigger points. ? Tends to be long-lasting (chronic), but is not life-threatening.  Fibromyalgia and myofascial pain are not the same. However, they often occur together. If you have both conditions, each can make the other worse. Both are common and can cause enough pain and fatigue to make day-to-day activities difficult. What are the causes? The exact causes of fibromyalgia and myofascial pain are not known. People with certain gene types may be more likely to develop fibromyalgia. Some factors can be triggers for both conditions, such as:  Spine disorders.  Arthritis.  Severe injury (trauma) and other physical stressors.  Being under a lot of stress.  A medical illness.  What are the signs or symptoms? Fibromyalgia The main symptom of fibromyalgia is widespread pain and tenderness in your muscles. This can vary over time. Pain is sometimes described as stabbing, shooting, or burning. You may have tingling or numbness, too. You may also have sleep problems and fatigue. You may wake up feeling tired and groggy (fibro fog). Other symptoms may include:  Bowel and bladder problems.  Headaches.  Visual problems.  Problems with odors and noises.  Depression or mood changes.  Painful menstrual periods (dysmenorrhea).  Dry skin or eyes.  Myofascial pain syndrome Symptoms of myofascial pain syndrome include:  Tight, ropy bands of  muscle.  Uncomfortable sensations in muscular areas, such as: ? Aching. ? Cramping. ? Burning. ? Numbness. ? Tingling. ? Muscle weakness.  Trouble moving certain muscles freely (range of motion).  How is this diagnosed? There are no specific tests to diagnose fibromyalgia or myofascial pain syndrome. Both can be hard to diagnose because their symptoms are common in many other conditions. Your health care provider may suspect one or both of these conditions based on your symptoms and medical history. Your health care provider will also do a physical exam. The key to diagnosing fibromyalgia is having pain, fatigue, and other symptoms for more than three months that cannot be explained by another condition. The key to diagnosing myofascial pain syndrome is finding trigger points in muscles that are tender and cause pain elsewhere in your body (referred pain). How is this treated? Treating fibromyalgia and myofascial pain often requires a team of health care providers. This usually starts with your primary provider and a physical therapist. You may also find it helpful to work with alternative health care providers, such as massage therapists or acupuncturists. Treatment for fibromyalgia may include medicines. This may include nonsteroidal anti-inflammatory drugs (NSAIDs), along with other medicines. Treatment for myofascial pain may also include:  NSAIDs.  Cooling and stretching of muscles.  Trigger point injections.  Sound wave (ultrasound) treatments to stimulate muscles.  Follow these instructions at home:  Take medicines only as directed by your health care provider.  Exercise as directed by your health care provider or physical therapist.  Try to avoid stressful situations.  Practice relaxation techniques to control your stress. You may want to try: ? Biofeedback. ? Visual   imagery. ? Hypnosis. ? Muscle relaxation. ? Yoga. ? Meditation.  Talk to your health care provider  about alternative treatments, such as acupuncture or massage treatment.  Maintain a healthy lifestyle. This includes eating a healthy diet and getting enough sleep.  Consider joining a support group.  Do not do activities that stress or strain your muscles. That includes repetitive motions and heavy lifting. Where to find more information:  National Fibromyalgia Association: www.fmaware.org  Arthritis Foundation: www.arthritis.org  American Chronic Pain Association: www.theacpa.org/condition/myofascial-pain Contact a health care provider if:  You have new symptoms.  Your symptoms get worse.  You have side effects from your medicines.  You have trouble sleeping.  Your condition is causing depression or anxiety. This information is not intended to replace advice given to you by your health care provider. Make sure you discuss any questions you have with your health care provider. Document Released: 08/23/2005 Document Revised: 01/29/2016 Document Reviewed: 05/29/2014 Elsevier Interactive Patient Education  2018 [employer]  

## 2017-05-19 NOTE — Progress Notes (Signed)
[facility]  [facility] Provider:  Tsutomu Barfoot L. Renato Gailseed, D.O., C.M.D.  Goals of Care:  Advanced Directives 05/19/2017  Does Patient Have a Medical Advance Directive? Yes  Type of Advance Directive Healthcare Power of Attorney  Does patient want to make changes to medical advance directive? -  Copy of Healthcare Power of Attorney in Chart? Yes   Chief Complaint  Patient presents with  . Medical Management of Chronic Issues    pain, dizzy, nausea transfer from Dr. Chilton SiGreen, discuss medications    HPI: Patient is a 71 y.o. female seen today for medical management of chronic diseases and transferring care from Dr. Chilton SiGreen who retired.  I am just meeting her today.  She's having increased pain, dizziness, and nausea.    Reviewed records re: chronic pain.  I see she had a CT of her lumbar spine in 2010 showing an age indeterminate L2 compression fracture and mild superior endplate compression of T11 and T12.  She had mild spondylosis with mild disc bulging and facet disease. No spinal stenosis or nerve root encroachment.  These were stable 2 weeks later.    She is on oxycodone 30mg  po q 6 hrs prn pain?  She's unable to manage her pain anymore.  Says she was doing good before [facility] got involved.  Hasn't been the same since.  She's nauseated all the time, can't get any relief.  Can't take 1/2 as much every 6 hrs.  Has pain in her back and all over.  Has fibromyalgia.  Says she has not found alternatives otc that manage her pain. Feels like she can't have a normal day now. They have changed pharmacies to [facility].  Says she hurts to bad she can't do anything.  Has also been on lyrica for years.  Is only taking the lyrica once a day b/c she had fluid retention with bid.  Does not want to go to pain mgt.    She is on prozac for bipolar depression and risperdal for relaxation.  Says mood has been about normal.  She has not seen a psychiatrist.    She has zofran for nausea.    She takes levothyroxine for  hypothyroidism.    She's not eating hardly anything.  Might have an ensure.  Will eat 1/2 sandwich at dinner.  Eats ice all day long.  Has dry mouth.    Still having yeast in her mouth.  Doesn't feel like eating. She does take a multivitamin daily.  She will occasionally eat something if her husband goes out to get it when she has a craving.  He says she's not eating vegetables at all.    Does report constipation.   No noted blood in stool or dark stool.    She fell this morning when she collided with the cat.    Past Medical History:  Diagnosis Date  . Abnormal involuntary movements(781.0)   . Alopecia, unspecified   . Anemia, unspecified   . Anxiety   . Bipolar disorder, unspecified (HCC)   . Depression   . Dysuria   . Edema   . Encounter for long-term (current) use of other medications   . Esophageal reflux   . Fibromyalgia   . Hyperthyroidism   . Insomnia   . Lumbago   . Nonspecific elevation of levels of transaminase or lactic acid dehydrogenase (LDH)   . Other abnormal blood chemistry    hyperglycemia  . Other malaise and fatigue   . Pathologic fracture of vertebrae   .  Reflux esophagitis   . Syncope and collapse   . Tachycardia, unspecified   . Unspecified essential hypertension   . Unspecified hypothyroidism     Past Surgical History:  Procedure Laterality Date  . CHOLECYSTECTOMY  10/2010   complicated by bile leak  . ERCP  11/03/2010   for bile leak  . LAPAROSCOPIC TUBAL LIGATION    . TONSILLECTOMY      Allergies  Allergen Reactions  . Fetzima [Levomilnacipran] Nausea Only    Allergies as of 05/19/2017      Reactions   Fetzima [levomilnacipran] Nausea Only      Medication List       Accurate as of 05/19/17 11:55 AM. Always use your most recent med list.          FLUoxetine 40 MG capsule Commonly known as:  PROZAC TAKE 1 CAPSULE BY MOUTH DAILY TO HELP NERVES AND DEPRESSION   levothyroxine 25 MCG tablet Commonly known as:  SYNTHROID,  LEVOTHROID TAKE 1 TABLET BY MOUTH EVERY DAY   LYRICA 75 MG capsule Generic drug:  pregabalin TAKE ONE CAPSULE BY MOUTH TWICE A DAY TO HELP WITH PAIN   NEXIUM 40 MG capsule Generic drug:  esomeprazole TAKE ONE CAPSULE BY MOUTH DAILY TO REDUCE STOMACH ACID   nystatin 100000 UNIT/ML suspension Commonly known as:  MYCOSTATIN 5ml by mouth four times daily, swish and spit   ondansetron 4 MG tablet Commonly known as:  ZOFRAN TAKE 1 TABLET BY MOUTH EVERY 8 HOURS AS NEEDED FOR NAUSEA   oxycodone 30 MG immediate release tablet Commonly known as:  ROXICODONE Take one tablet every 6 hours as needed for pain.   risperiDONE 1 MG tablet Commonly known as:  RISPERDAL TAKE 1 TABLET BY MOUTH TWICE A DAY TO HELP RELAX       Review of Systems:  Review of Systems  Constitutional: Positive for malaise/fatigue and weight loss. Negative for chills and fever.  HENT: Negative for congestion.        Thrush  Eyes: Negative for blurred vision.  Respiratory: Negative for cough and shortness of breath.   Cardiovascular: Negative for chest pain and leg swelling.  Gastrointestinal: Positive for constipation. Negative for abdominal pain, blood in stool and melena.  Genitourinary: Negative for dysuria.  Musculoskeletal: Positive for back pain, falls and myalgias.  Skin: Negative for itching and rash.  Neurological: Positive for dizziness and weakness. Negative for loss of consciousness and headaches.  Psychiatric/Behavioral: Positive for depression. Negative for memory loss. The patient is nervous/anxious. The patient does not have insomnia.        On high doses of oxycodone    Health Maintenance  Topic Date Due  . Hepatitis C Screening  [birth year]  . COLONOSCOPY  08/01/1995  . DEXA SCAN  07/31/2010  . INFLUENZA VACCINE  04/06/2017  . MAMMOGRAM  04/05/2018  . TETANUS/TDAP  10/12/2021  . PNA vac Low Risk Adult  Completed    Physical Exam: Vitals:   05/19/17 1139  BP: 110/60  Pulse: (!) 105    Temp: 98.1 F (36.7 C)  TempSrc: Oral  SpO2: 96%  Weight: 135 lb (61.2 kg)   Body mass index is 23.17 kg/m. Physical Exam  Constitutional: She is oriented to person, place, and time.  Ill-appearing white female, very pale, unsteady when walking; occasionally makes a face whincing in pain wrinkling up her eyes and knows and making a fist in her abdomen (but denies pain there), clothing is large on her  HENT:  Head:  Normocephalic and atraumatic.  Eyes: No scleral icterus.  Cardiovascular: Regular rhythm and intact distal pulses.   Murmur heard. tachycardic  Pulmonary/Chest: Effort normal and breath sounds normal. No respiratory distress.  Abdominal: Bowel sounds are normal.  Musculoskeletal: Normal range of motion. She exhibits tenderness.  Over fibromyalgia points of paravertebral areas, trapezius, elbows, knees  Neurological: She is alert and oriented to person, place, and time.  Skin: Skin is warm and dry. There is pallor.  Pale skin and very pale mucus membranes and conjunctiva  Psychiatric:  Jittery, tremulous    Labs reviewed: Basic Metabolic Panel:  Recent Labs  45/40/98 1417  NA 131*  K 4.9  CL 96*  CO2 26  GLUCOSE 87  BUN 10  CREATININE 0.83  CALCIUM 8.9  TSH 2.12   Liver Function Tests:  Recent Labs  12/22/16 1417  AST 12  ALT 6  ALKPHOS 47  BILITOT 0.3  PROT 5.6*  ALBUMIN 3.5*   No results for input(s): LIPASE, AMYLASE in the last 8760 hours. No results for input(s): AMMONIA in the last 8760 hours. CBC: No results for input(s): WBC, NEUTROABS, HGB, HCT, MCV, PLT in the last 8760 hours. Lipid Panel: No results for input(s): CHOL, HDL, LDLCALC, TRIG, CHOLHDL, LDLDIRECT in the last 8760 hours. No results found for: HGBA1C  Procedures since last visit: No results found.  Assessment/Plan 1. Fibromyalgia -has had this diagnosis -discussed with them that typically best treatments for this include the lyrica she is on, water aerobics,  antidepressants, and therapy, not opioids -much of the visit was spent hearing pt and her husband repeat that ever since her oxycodone was reduced from  every 4 hrs to  every 6 hrs, she's been in excruciating pain and cannot function -clearly she is addicted -I also counseled them that there is another problem clearly going on with her symptoms besides just withdrawal  -drug screen was done by oral swab today   2. Bipolar disorder, in partial remission, most recent episode depressed (HCC) -in history, but being treated by prior PCP not psychiatrist--is on prozac and risperdal--not clear that she's been psychotic and I've usually seen seroquel or abilify as the adjunct -suggested making changes with this, but she was opposed saying she'd been on these for a long time and she thinks her depression is controlled (but clearly the fibromyalgia is not by my exam)  3. Hypothyroidism, unspecified type -cont levothyroxine and f/u lab - TSH  4. Hyperglycemia -in history, f/u labs: - COMPLETE METABOLIC PANEL WITH GFR - Hemoglobin A1c  5. Pallor -also has been losing weight over the past year--down to 135 from 152 lbs around this time last year with poor intake--takes 1-2 ensures per day and may eat 1/2 sandwich -? GI source or hematologic malignancy - CBC with Differential/Platelet  6. Pica -having pica of ice - CBC with Differential/Platelet  7. Therapeutic opioid induced constipation - amazing she's moving her bowels at all with her relative immobility (spending most time in bed, poor po intake and large oxycodone doses - senna (SENOKOT) 8.6 MG TABS tablet; Take 1 tablet (8.6 mg total) by mouth daily as needed for mild constipation.  Dispense: 120 each; Refill: 0  8. High risk medication use -has an opioid addiction and taking high doses of oxycodone that I think are causing her to fall, be weak, dizzy and nauseous, plus suspect anemia given other symptoms/clinical  presentation -recommended pain mgt, but she and her husband refuse--I am not comfortable continuing this much  medication in this patient given her side effects she's experiencing, small stature and risk of overdose - Drug Tox Monitor 1 w/Conf, Oral Fld  Labs/tests ordered:   Orders Placed This Encounter  Procedures  . CBC with Differential/Platelet  . COMPLETE METABOLIC PANEL WITH GFR  . Hemoglobin A1c  . TSH  . Drug Tox Monitor 1 w/Conf, Oral Fld    Next appt:  About 1 wk for f/u and possible additional labs if labs are not critical upon return--explained to husband while pt getting labs that they may hear from [place] that she needs to go to the hospital if her hgb is very low  Vincen Bejar L. Sheketa Ende, D.O. Geriatrics [facility] [address], [place] [address] Cell Phone (Mon-Fri 8am-5pm):  [phone] On Call:  [phone] & follow prompts after 5pm & weekends Office Phone:  [phone] Office Fax:  [phone]

## 2017-05-20 ENCOUNTER — Observation Stay (HOSPITAL_COMMUNITY)
Admission: EM | Admit: 2017-05-20 | Discharge: 2017-05-21 | Disposition: A | Discharge status: Home / Self Care | Payer: Medicare Other | Attending: Family Medicine | Admitting: Family Medicine

## 2017-05-20 ENCOUNTER — Telehealth: Payer: Self-pay | Admitting: *Deleted

## 2017-05-20 ENCOUNTER — Encounter (HOSPITAL_COMMUNITY): Payer: Self-pay

## 2017-05-20 DIAGNOSIS — R9431 Abnormal electrocardiogram [ECG] [EKG]: Secondary | ICD-10-CM | POA: Diagnosis not present

## 2017-05-20 DIAGNOSIS — E039 Hypothyroidism, unspecified: Secondary | ICD-10-CM | POA: Diagnosis present

## 2017-05-20 DIAGNOSIS — F419 Anxiety disorder, unspecified: Secondary | ICD-10-CM | POA: Diagnosis not present

## 2017-05-20 DIAGNOSIS — M797 Fibromyalgia: Secondary | ICD-10-CM | POA: Diagnosis present

## 2017-05-20 DIAGNOSIS — Z79899 Other long term (current) drug therapy: Secondary | ICD-10-CM | POA: Diagnosis not present

## 2017-05-20 DIAGNOSIS — F319 Bipolar disorder, unspecified: Secondary | ICD-10-CM | POA: Diagnosis present

## 2017-05-20 DIAGNOSIS — F3175 Bipolar disorder, in partial remission, most recent episode depressed: Secondary | ICD-10-CM | POA: Diagnosis not present

## 2017-05-20 DIAGNOSIS — I10 Essential (primary) hypertension: Secondary | ICD-10-CM | POA: Diagnosis present

## 2017-05-20 DIAGNOSIS — D649 Anemia, unspecified: Principal | ICD-10-CM | POA: Diagnosis present

## 2017-05-20 LAB — COMPREHENSIVE METABOLIC PANEL
ALT: 9 U/L — AB (ref 14–54)
AST: 14 U/L — AB (ref 15–41)
Albumin: 3.6 g/dL (ref 3.5–5.0)
Alkaline Phosphatase: 38 U/L (ref 38–126)
Anion gap: 9 (ref 5–15)
BILIRUBIN TOTAL: 0.6 mg/dL (ref 0.3–1.2)
BUN: 9 mg/dL (ref 6–20)
CO2: 24 mmol/L (ref 22–32)
CREATININE: 0.83 mg/dL (ref 0.44–1.00)
Calcium: 8.3 mg/dL — ABNORMAL LOW (ref 8.9–10.3)
Chloride: 102 mmol/L (ref 101–111)
Glucose, Bld: 126 mg/dL — ABNORMAL HIGH (ref 65–99)
Potassium: 3.4 mmol/L — ABNORMAL LOW (ref 3.5–5.1)
Sodium: 135 mmol/L (ref 135–145)
TOTAL PROTEIN: 5.5 g/dL — AB (ref 6.5–8.1)

## 2017-05-20 LAB — CBC WITH DIFFERENTIAL/PLATELET
BASOS PCT: 1 %
Basophils Absolute: 0 10*3/uL (ref 0.0–0.1)
EOS PCT: 5 %
Eosinophils Absolute: 0.2 10*3/uL (ref 0.0–0.7)
HEMATOCRIT: 15.5 % — AB (ref 36.0–46.0)
Hemoglobin: 3.8 g/dL — CL (ref 12.0–15.0)
LYMPHS ABS: 1.3 10*3/uL (ref 0.7–4.0)
Lymphocytes Relative: 40 %
MCH: 14.7 pg — AB (ref 26.0–34.0)
MCHC: 24.5 g/dL — ABNORMAL LOW (ref 30.0–36.0)
MCV: 60.1 fL — AB (ref 78.0–100.0)
MONO ABS: 0.4 10*3/uL (ref 0.1–1.0)
MONOS PCT: 13 %
Neutro Abs: 1.3 10*3/uL — ABNORMAL LOW (ref 1.7–7.7)
Neutrophils Relative %: 41 %
Platelets: 344 10*3/uL (ref 150–400)
RBC: 2.58 MIL/uL — AB (ref 3.87–5.11)
RDW: 21.1 % — AB (ref 11.5–15.5)
WBC: 3.2 10*3/uL — AB (ref 4.0–10.5)

## 2017-05-20 LAB — SAVE SMEAR

## 2017-05-20 LAB — FOLATE: FOLATE: 27 ng/mL (ref >5.9)

## 2017-05-20 LAB — PREPARE RBC (CROSSMATCH)

## 2017-05-20 LAB — VITAMIN B12: VITAMIN B 12: 370 pg/mL (ref 180–914)

## 2017-05-20 LAB — POC OCCULT BLOOD, ED: Fecal Occult Bld: NEGATIVE

## 2017-05-20 LAB — RETICULOCYTES
RBC.: 2.58 MIL/uL — AB (ref 3.87–5.11)
RETIC COUNT ABSOLUTE: 64.5 10*3/uL (ref 19.0–186.0)
RETIC CT PCT: 2.5 % (ref 0.4–3.1)

## 2017-05-20 LAB — FERRITIN: Ferritin: 2 ng/mL — ABNORMAL LOW (ref 11–307)

## 2017-05-20 LAB — IRON AND TIBC
Iron: 7 ug/dL — ABNORMAL LOW (ref 28–170)
Saturation Ratios: 2 % — ABNORMAL LOW (ref 10.4–31.8)
TIBC: 386 ug/dL (ref 250–450)
UIBC: 379 ug/dL

## 2017-05-20 LAB — LACTATE DEHYDROGENASE: LDH: 134 U/L (ref 98–192)

## 2017-05-20 MED ORDER — PREGABALIN 75 MG PO CAPS
75.0000 mg | ORAL_CAPSULE | Freq: Every day | ORAL | Status: DC
  Administered 2017-05-20 – 2017-05-21 (×2): 75 mg via ORAL
  Filled 2017-05-20 (×2): qty 1

## 2017-05-20 MED ORDER — OXYCODONE HCL 5 MG PO TABS: 15.0000 mg | ORAL_TABLET | Freq: Four times a day (QID) | ORAL | Status: DC | PRN

## 2017-05-20 MED ORDER — ONDANSETRON HCL 4 MG PO TABS: 4.0000 mg | ORAL_TABLET | Freq: Four times a day (QID) | ORAL | Status: DC | PRN

## 2017-05-20 MED ORDER — BISACODYL 5 MG PO TBEC: 5.0000 mg | DELAYED_RELEASE_TABLET | Freq: Every day | ORAL | Status: DC | PRN

## 2017-05-20 MED ORDER — SENNA 8.6 MG PO TABS: 1.0000 | ORAL_TABLET | Freq: Every day | ORAL | Status: DC | PRN

## 2017-05-20 MED ORDER — ONDANSETRON HCL 4 MG/2ML IJ SOLN: 4.0000 mg | Freq: Four times a day (QID) | INTRAMUSCULAR | Status: DC | PRN

## 2017-05-20 MED ORDER — SODIUM CHLORIDE 0.9 % IV BOLUS (SEPSIS)
1000.0000 mL | Freq: Once | INTRAVENOUS | Status: AC
  Administered 2017-05-20: 1000 mL via INTRAVENOUS

## 2017-05-20 MED ORDER — FLUOXETINE HCL 20 MG PO CAPS
40.0000 mg | ORAL_CAPSULE | Freq: Every day | ORAL | Status: DC
  Administered 2017-05-20 – 2017-05-21 (×2): 40 mg via ORAL
  Filled 2017-05-20 (×2): qty 2

## 2017-05-20 MED ORDER — ADULT MULTIVITAMIN W/MINERALS CH
1.0000 | ORAL_TABLET | Freq: Every day | ORAL | Status: DC
  Administered 2017-05-20 – 2017-05-21 (×2): 1 via ORAL
  Filled 2017-05-20 (×2): qty 1

## 2017-05-20 MED ORDER — RISPERIDONE 1 MG PO TABS
1.0000 mg | ORAL_TABLET | Freq: Every day | ORAL | Status: DC
  Administered 2017-05-20 – 2017-05-21 (×2): 1 mg via ORAL
  Filled 2017-05-20 (×2): qty 1

## 2017-05-20 MED ORDER — SODIUM CHLORIDE 0.9 % IV SOLN
510.0000 mg | Freq: Once | INTRAVENOUS | Status: AC
  Administered 2017-05-20: 510 mg via INTRAVENOUS
  Filled 2017-05-20: qty 17

## 2017-05-20 MED ORDER — FLUOXETINE HCL 40 MG PO CAPS: 40.0000 mg | ORAL_CAPSULE | Freq: Every day | ORAL | Status: DC

## 2017-05-20 MED ORDER — OXYCODONE HCL 5 MG PO TABS
5.0000 mg | ORAL_TABLET | Freq: Once | ORAL | Status: AC
  Administered 2017-05-20: 5 mg via ORAL
  Filled 2017-05-20: qty 1

## 2017-05-20 MED ORDER — PANTOPRAZOLE SODIUM 40 MG PO TBEC
40.0000 mg | DELAYED_RELEASE_TABLET | Freq: Every day | ORAL | Status: DC
  Administered 2017-05-20 – 2017-05-21 (×2): 40 mg via ORAL
  Filled 2017-05-20 (×2): qty 1

## 2017-05-20 MED ORDER — CYANOCOBALAMIN 1000 MCG/ML IJ SOLN
1000.0000 ug | Freq: Once | INTRAMUSCULAR | Status: AC
  Administered 2017-05-20: 1000 ug via INTRAMUSCULAR
  Filled 2017-05-20: qty 1

## 2017-05-20 MED ORDER — INFLUENZA VAC SPLIT HIGH-DOSE 0.5 ML IM SUSY: 0.5000 mL | PREFILLED_SYRINGE | INTRAMUSCULAR | Status: DC | PRN

## 2017-05-20 MED ORDER — SODIUM CHLORIDE 0.9 % IV SOLN
10.0000 mL/h | Freq: Once | INTRAVENOUS | Status: AC
  Administered 2017-05-20: 10 mL/h via INTRAVENOUS

## 2017-05-20 MED ORDER — OXYCODONE HCL 5 MG PO TABS
15.0000 mg | ORAL_TABLET | Freq: Four times a day (QID) | ORAL | Status: DC | PRN
  Administered 2017-05-20 – 2017-05-21 (×3): 15 mg via ORAL
  Filled 2017-05-20 (×3): qty 3

## 2017-05-20 MED ORDER — LEVOTHYROXINE SODIUM 25 MCG PO TABS
25.0000 ug | ORAL_TABLET | Freq: Every day | ORAL | Status: DC
  Administered 2017-05-20 – 2017-05-21 (×2): 25 ug via ORAL
  Filled 2017-05-20 (×2): qty 1

## 2017-05-20 NOTE — H&P (Addendum)
History and Physical    Sarah Miranda:[id number] DOB: [birth year] DOA: 05/20/2017  I have briefly reviewed the patient's prior medical records in [facility]  PCP: Kermit Balo, DO  Patient coming from: Home  Chief Complaint: Called in with low blood counts  HPI: Sarah Miranda is a 71 y.o. female with medical history significant of anxiety, bipolar disorder, hypothyroidism, chronic pain/fibromyalgia, presents to the emergency room with chief complaint of weakness.  Patient has been experiencing weakness and difficulties with activities of daily living for the past month.  He found it increasingly difficult and was getting very tired with ambulation even for short distances.  She saw her primary care MD yesterday, and had blood work done.  It revealed a hemoglobin in the 4 range.  She was directed to come to the emergency room.  She denies any blood in her stool or dark tarry stools.  She denies any bleeding anywhere else.  She states that over the last several months has been feeling very thirsty and was eating a lot of ice chips.  She has no fever or chills.  She denies any chest pain or breathing difficulties.  ED Course: In the emergency room her vital signs are stable, and blood work reveals a hemoglobin of 3.8.  White count is 3.2, and platelets are 344.  She was ordered 3 units of packed red blood cells by EDP and we were asked to admit.  Review of Systems: As per HPI otherwise 10 point review of systems negative.   Past Medical History:  Diagnosis Date  . Abnormal involuntary movements(781.0)   . Alopecia, unspecified   . Anemia, unspecified   . Anxiety   . Bipolar disorder, unspecified (HCC)   . Depression   . Dysuria   . Edema   . Encounter for long-term (current) use of other medications   . Esophageal reflux   . Fibromyalgia   . Hyperthyroidism   . Insomnia   . Lumbago   . Nonspecific elevation of levels of transaminase or lactic acid dehydrogenase (LDH)   .  Other abnormal blood chemistry    hyperglycemia  . Other malaise and fatigue   . Pathologic fracture of vertebrae   . Reflux esophagitis   . Syncope and collapse   . Tachycardia, unspecified   . Unspecified essential hypertension   . Unspecified hypothyroidism     Past Surgical History:  Procedure Laterality Date  . CHOLECYSTECTOMY  10/2010   complicated by bile leak  . ERCP  11/03/2010   for bile leak  . LAPAROSCOPIC TUBAL LIGATION    . TONSILLECTOMY       reports that she has never smoked. She has never used smokeless tobacco. She reports that she does not drink alcohol or use drugs.  Allergies  Allergen Reactions  . Fetzima [Levomilnacipran] Nausea Only    Family History  Problem Relation Age of Onset  . Cancer Father        prostate  . Colon cancer Neg Hx     Prior to Admission medications   Medication Sig Start Date End Date Taking? Authorizing Provider  esomeprazole (NEXIUM 24HR) 20 MG capsule Take 20 mg by mouth daily as needed (for acid reflex).   Yes [provider]  FLUoxetine (PROZAC) 40 MG capsule TAKE 1 CAPSULE BY MOUTH DAILY TO HELP NERVES AND DEPRESSION 05/18/17  Yes Reed, Tiffany L, DO  levothyroxine (SYNTHROID, LEVOTHROID) 25 MCG tablet TAKE 1 TABLET BY MOUTH EVERY DAY  05/18/17  Yes Reed, Tiffany L, DO  Multiple Vitamin (MULTIVITAMIN WITH MINERALS) TABS tablet Take 1 tablet by mouth daily.   Yes [provider]  nystatin (MYCOSTATIN) 100000 UNIT/ML suspension 5ml by mouth four times daily, swish and spit 02/18/16  Yes Eubanks, Janene Harvey, NP  ondansetron (ZOFRAN) 4 MG tablet TAKE 1 TABLET BY MOUTH EVERY 8 HOURS AS NEEDED FOR NAUSEA 05/18/17  Yes Reed, Tiffany L, DO  oxycodone (ROXICODONE) 30 MG immediate release tablet Take one tablet every 6 hours as needed for pain. 04/29/17  Yes Sharon Seller, NP  pregabalin (LYRICA) 75 MG capsule Take 1 capsule (75 mg total) by mouth daily. 05/19/17  Yes Reed, Tiffany L, DO  risperiDONE (RISPERDAL) 1 MG  tablet TAKE 1 TABLET BY MOUTH TWICE A DAY TO HELP RELAX Patient taking differently: TAKE 1 TABLET BY MOUTH once per day 01/10/17  Yes Kimber Relic, MD  senna (SENOKOT) 8.6 MG TABS tablet Take 1 tablet (8.6 mg total) by mouth daily as needed for mild constipation. 05/19/17  Yes Reed, Tiffany L, DO  NEXIUM 40 MG capsule TAKE ONE CAPSULE BY MOUTH DAILY TO REDUCE STOMACH ACID Patient not taking: Reported on 05/20/2017 09/12/14   Sharon Seller, NP    Physical Exam: Vitals:   05/20/17 0922 05/20/17 0927 05/20/17 1012  BP: 130/65  (!) 129/59  Pulse: (!) 103  95  Resp: (!) 23  13  Temp: (!) 97.4 F (36.3 C)    TempSrc: Oral    SpO2: 99%  98%  Weight:  61.2 kg (135 lb)   Height:   (1.626 m)       Constitutional: NAD, calm, comfortable, pale appearing Caucasian female Eyes: PERRL, lids and conjunctivae normal ENMT: Mucous membranes are moist. Posterior pharynx clear of any exudate or lesions.  Neck: normal, supple, no thyromegaly Respiratory: clear to auscultation bilaterally, no wheezing, no crackles. Normal respiratory effort. No accessory muscle use.  Cardiovascular: Regular rate and rhythm, 3/6 SEM, no rubs / gallops. Trace extremity edema. 2+ pedal pulses.  Abdomen: no tenderness, no masses palpated. Bowel sounds positive.  Musculoskeletal: no clubbing / cyanosis. Normal muscle tone.  Skin: no rashes, lesions, ulcers. No induration Neurologic: CN 2-12 grossly intact. Strength 5/5 in all 4.  Psychiatric: Normal judgment and insight. Alert and oriented x 3. Normal mood.   Labs on Admission: I have personally reviewed following labs and imaging studies  CBC:  Recent Labs Lab 05/19/17 1245 05/20/17 0956  WBC 3.5* 3.2*  NEUTROABS 1,502 1.3*  HGB 4.1* 3.8*  HCT 17.6* 15.5*  MCV 60.3* 60.1*  PLT 374 344   Basic Metabolic Panel:  Recent Labs Lab 05/19/17 1245 05/20/17 0956  NA 135 135  K 4.7 3.4*  CL 100 102  CO2 24 24  GLUCOSE 110 126*  BUN 8 9  CREATININE  0.80 0.83  CALCIUM 8.7 8.3*   GFR: Estimated Creatinine Clearance: 53.7 mL/min (by C-G formula based on SCr of 0.83 mg/dL). Liver Function Tests:  Recent Labs Lab 05/19/17 1245 05/20/17 0956  AST 11 14*  ALT 6 9*  ALKPHOS  --  38  BILITOT 0.4 0.6  PROT 5.6* 5.5*  ALBUMIN  --  3.6   No results for input(s): LIPASE, AMYLASE in the last 168 hours. No results for input(s): AMMONIA in the last 168 hours. Coagulation Profile: No results for input(s): INR, PROTIME in the last 168 hours. Cardiac Enzymes: No results for input(s): CKTOTAL, CKMB, CKMBINDEX, TROPONINI in the  last 168 hours. BNP (last 3 results) No results for input(s): PROBNP in the last 8760 hours. HbA1C:  Recent Labs  05/19/17 1245  HGBA1C 4.7   CBG: No results for input(s): GLUCAP in the last 168 hours. Lipid Profile: No results for input(s): CHOL, HDL, LDLCALC, TRIG, CHOLHDL, LDLDIRECT in the last 72 hours. Thyroid Function Tests:  Recent Labs  05/19/17 1245  TSH 1.89   Anemia Panel:  Recent Labs  05/20/17 0956  RETICCTPCT 2.5   Urine analysis:    Component Value Date/Time   COLORURINE YELLOW 10/31/2010 1726   APPEARANCEUR CLEAR 10/31/2010 1726   LABSPEC 1.016 10/31/2010 1726   PHURINE 7.5 10/31/2010 1726   HGBUR SMALL (A) 10/31/2010 1726   BILIRUBINUR NEGATIVE 10/31/2010 1726   KETONESUR NEGATIVE 10/31/2010 1726   PROTEINUR NEGATIVE 10/31/2010 1726   UROBILINOGEN 4.0 (H) 10/31/2010 1726   NITRITE NEGATIVE 10/31/2010 1726   LEUKOCYTESUR NEGATIVE 10/31/2010 1726     Radiological Exams on Admission: No results found.   Assessment/Plan Active Problems:   Fibromyalgia   Anxiety   Hypothyroidism   Bipolar disorder (HCC)   Essential hypertension   Symptomatic anemia    Symptomatic microcytic anemia -Unclear at this point what caused her anemia, but it appears that this is a chronic process given progressive symptoms over the course of several weeks/months. -Obtain anemia  panel -Obtain LDH, haptoglobin -Peripheral smear/pathologist review -We will transfuse 3 units of packed red blood cells, repeat CBC afterwards -Fecal occult was negative  Hypothyroidism -Continue levothyroxine, check a TSH  Bipolar disorder/anxiety/depression -Continue home regimen  Hypertension -Carries a diagnosis of hypertension, she does not appear to be on any medications.  Monitor BP  Addendum: Iron is quite low, give IV iron x 1. B12 on the low side, B12 IM x 1  DVT prophylaxis: SCDs Code Status: Full code Family Communication: Husband at bedside Disposition Plan: Admit to MedSurg, anticipate home discharge when ready Consults called: None    Admission status: Observation  At the point of initial evaluation, it is my clinical opinion that admission for OBSERVATION is reasonable and necessary because the patient's presenting complaints in the context of their chronic conditions represent sufficient risk of deterioration or significant morbidity to constitute reasonable grounds for close observation in the hospital setting, but that the patient may be medically stable for discharge from the hospital within 24 to 48 hours.     Pamella Pert, MD [facility] Pager [phone]  If 7PM-7AM, please contact night-coverage [email or web address] Password [facility]  05/20/2017, 11:49 AM

## 2017-05-20 NOTE — ED Provider Notes (Signed)
Medical screening examination/treatment/procedure(s) were conducted as a shared visit with non-physician practitioner(s) and myself.  I personally evaluated the patient during the encounter.   EKG Interpretation None     patient has had increasing fatigue for about a month. She went to see her family doctor and had labs drawn. She was identified to be significantly anemic and referred to the emergency department. Patient denies any local pain. She reports she has fibromyalgia and thus always has areas of discomfort but has not noted anything particularly different. Patient is alert and nontoxic. Very pale in appearance. Mental status clear. No respiratory distress. Heart regular without rub or gallop. Lungs grossly clear. Abdomen is soft. Patient endorses some mild discomfort to lower abdominal palpation but mostly reports it makes her feel like she has to urinate. No peripheral edema. Patient critically anemic. Plan will be to initiate blood transfusion and admitted to the hospital. I agree with plan and management.   Arby Barrette, MD 05/20/17 1123

## 2017-05-20 NOTE — ED Triage Notes (Signed)
Pt received phone call from [facility], Dr. Bufford Spikes stating that pt needed to go to ED for Hemoglobin of 4.0.

## 2017-05-20 NOTE — Telephone Encounter (Signed)
[facility] called with Lab Alert on patient's Hemoglobin of 4.1. Labs printed and given to [facility], Dr. Ernest Mallick Medical Assistant and she is in contact with Dr. Renato Gails.

## 2017-05-20 NOTE — Telephone Encounter (Signed)
05/20/17 Pt was referred to the ED for her severe anemia with hgb 4.1, pallor, pica, tachycardia.  Needs transfusion and anemia workup done.  Also needs observation for improvement of pain management.  Possible psychiatry consult.  Please refer to my note from yesterday for details.  Kashmir Leedy L. Katura Eatherly, D.O. Geriatrics [facility] [address], [place] [address] Cell Phone (Mon-Fri 8am-5pm):  [phone] On Call:  [phone] & follow prompts after 5pm & weekends Office Phone:  [phone] Office Fax:  [phone]

## 2017-05-20 NOTE — ED Notes (Signed)
RN remains in room for initial blood administration per protocol.

## 2017-05-20 NOTE — ED Provider Notes (Signed)
[facility]-EMERGENCY DEPT Provider Note   CSN: [id number] Arrival date & time: 05/20/17  4098     History   Chief Complaint Chief Complaint  Patient presents with  . Anemia    HPI Sarah Miranda is a 71 y.o. female.  The history is provided by the patient and medical records. No language interpreter was used.  Anemia  Pertinent negatives include no abdominal pain.   Sarah Miranda is a 71 y.o. female  with a PMH of anemia, thyroid disorder, HTN who presents to the Emergency Department from PCP for abnormal lab. Per nursing report, patient was seen by Dr. Bufford Spikes yesterday where blood work was obtained and hemoglobin came back at 4.0. Patient states that she saw PCP for a routine appointment. She does endorse feeling tired over the last month, but just thought she was tired because she was getting older. She does endorse nausea. No abdominal pain or vomiting. No blood in the stool. No diarrhea. No vaginal bleeding.  Past Medical History:  Diagnosis Date  . Abnormal involuntary movements(781.0)   . Alopecia, unspecified   . Anemia, unspecified   . Anxiety   . Bipolar disorder, unspecified (HCC)   . Depression   . Dysuria   . Edema   . Encounter for long-term (current) use of other medications   . Esophageal reflux   . Fibromyalgia   . Hyperthyroidism   . Insomnia   . Lumbago   . Nonspecific elevation of levels of transaminase or lactic acid dehydrogenase (LDH)   . Other abnormal blood chemistry    hyperglycemia  . Other malaise and fatigue   . Pathologic fracture of vertebrae   . Reflux esophagitis   . Syncope and collapse   . Tachycardia, unspecified   . Unspecified essential hypertension   . Unspecified hypothyroidism     Patient Active Problem List   Diagnosis Date Noted  . Nausea without vomiting 06/23/2016  . Corn 06/23/2016  . Callus 06/02/2014  . Hyponatremia 01/22/2014  . Depression   . Insomnia   . Fibromyalgia   . Anxiety   . Hypothyroidism   .  Hyperglycemia   . Malaise   . Bipolar disorder (HCC)   . Edema   . Essential hypertension     Past Surgical History:  Procedure Laterality Date  . CHOLECYSTECTOMY  10/2010   complicated by bile leak  . ERCP  11/03/2010   for bile leak  . LAPAROSCOPIC TUBAL LIGATION    . TONSILLECTOMY      OB History    No data available       Home Medications    Prior to Admission medications   Medication Sig Start Date End Date Taking? Authorizing Provider  esomeprazole (NEXIUM 24HR) 20 MG capsule Take 20 mg by mouth daily as needed (for acid reflex).   Yes [provider]  FLUoxetine (PROZAC) 40 MG capsule TAKE 1 CAPSULE BY MOUTH DAILY TO HELP NERVES AND DEPRESSION 05/18/17  Yes Reed, Tiffany L, DO  levothyroxine (SYNTHROID, LEVOTHROID) 25 MCG tablet TAKE 1 TABLET BY MOUTH EVERY DAY 05/18/17  Yes Reed, Tiffany L, DO  Multiple Vitamin (MULTIVITAMIN WITH MINERALS) TABS tablet Take 1 tablet by mouth daily.   Yes [provider]  nystatin (MYCOSTATIN) 100000 UNIT/ML suspension 5ml by mouth four times daily, swish and spit 02/18/16  Yes Eubanks, Janene Harvey, NP  ondansetron (ZOFRAN) 4 MG tablet TAKE 1 TABLET BY MOUTH EVERY 8 HOURS AS NEEDED FOR NAUSEA 05/18/17  Yes  Reed, Tiffany L, DO  oxycodone (ROXICODONE) 30 MG immediate release tablet Take one tablet every 6 hours as needed for pain. 04/29/17  Yes Sharon Seller, NP  pregabalin (LYRICA) 75 MG capsule Take 1 capsule (75 mg total) by mouth daily. 05/19/17  Yes Reed, Tiffany L, DO  risperiDONE (RISPERDAL) 1 MG tablet TAKE 1 TABLET BY MOUTH TWICE A DAY TO HELP RELAX Patient taking differently: TAKE 1 TABLET BY MOUTH once per day 01/10/17  Yes Kimber Relic, MD  senna (SENOKOT) 8.6 MG TABS tablet Take 1 tablet (8.6 mg total) by mouth daily as needed for mild constipation. 05/19/17  Yes Reed, Tiffany L, DO  NEXIUM 40 MG capsule TAKE ONE CAPSULE BY MOUTH DAILY TO REDUCE STOMACH ACID Patient not taking: Reported on 05/20/2017 09/12/14    Sharon Seller, NP    Family History Family History  Problem Relation Age of Onset  . Cancer Father        prostate  . Colon cancer Neg Hx     Social History Social History  Substance Use Topics  . Smoking status: Never Smoker  . Smokeless tobacco: Never Used  . Alcohol use No     Comment: 1 drink occasionally      Allergies   Fetzima [levomilnacipran]   Review of Systems Review of Systems  Constitutional: Positive for fatigue.  Gastrointestinal: Positive for constipation and nausea. Negative for abdominal pain, blood in stool, diarrhea and vomiting.  Neurological: Positive for weakness.  All other systems reviewed and are negative.    Physical Exam Updated Vital Signs BP (!) 129/59 (BP Location: Left Arm)   Pulse 95   Temp (!) 97.4 F (36.3 C) (Oral)   Resp 13   Ht  (1.626 m)   Wt 61.2 kg (135 lb)   SpO2 98%   BMI 23.17 kg/m   Physical Exam  Constitutional: She is oriented to person, place, and time. She appears well-developed and well-nourished. No distress.  HENT:  Head: Normocephalic and atraumatic.  Cardiovascular: Normal heart sounds.   No murmur heard. Mildly tachycardic but regular.  Pulmonary/Chest: Effort normal and breath sounds normal. No respiratory distress.  Abdominal: Soft. She exhibits no distension. There is no tenderness.  Genitourinary:  Genitourinary Comments: Chaperone present for exam. No gross blood noted on rectal exam.  Musculoskeletal: Normal range of motion.  Neurological: She is alert and oriented to person, place, and time.  Skin: Skin is warm and dry. There is pallor.  Nursing note and vitals reviewed.    ED Treatments / Results  Labs (all labs ordered are listed, but only abnormal results are displayed) Labs Reviewed  CBC WITH DIFFERENTIAL/PLATELET - Abnormal; Notable for the following:       Result Value   WBC 3.2 (*)    RBC 2.58 (*)    Hemoglobin 3.8 (*)    HCT 15.5 (*)    MCV 60.1 (*)    MCH 14.7  (*)    MCHC 24.5 (*)    RDW 21.1 (*)    Neutro Abs 1.3 (*)    All other components within normal limits  COMPREHENSIVE METABOLIC PANEL - Abnormal; Notable for the following:    Potassium 3.4 (*)    Glucose, Bld 126 (*)    Calcium 8.3 (*)    Total Protein 5.5 (*)    AST 14 (*)    ALT 9 (*)    All other components within normal limits  RETICULOCYTES - Abnormal; Notable for  the following:    RBC. 2.58 (*)    All other components within normal limits  VITAMIN B12  FOLATE  IRON AND TIBC  FERRITIN  POC OCCULT BLOOD, ED  TYPE AND SCREEN  PREPARE RBC (CROSSMATCH)    EKG  EKG Interpretation None       Radiology No results found.  Procedures Procedures (including critical care time)  CRITICAL CARE Performed by: Chase Picket Ward  Total critical care time: 35 minutes  Critical care time was exclusive of separately billable procedures and treating other patients.  Critical care was necessary to treat or prevent imminent or life-threatening deterioration.  Critical care was time spent personally by me on the following activities: development of treatment plan with patient and/or surrogate as well as nursing, discussions with consultants, evaluation of patient's response to treatment, examination of patient, obtaining history from patient or surrogate, ordering and performing treatments and interventions, ordering and review of laboratory studies, ordering and review of radiographic studies, pulse oximetry and re-evaluation of patient's condition.  Medications Ordered in ED Medications  0.9 %  sodium chloride infusion (not administered)  sodium chloride 0.9 % bolus 1,000 mL (1,000 mLs Intravenous New Bag/Given 05/20/17 1005)     Initial Impression / Assessment and Plan / ED Course  I have reviewed the triage vital signs and the nursing notes.  Pertinent labs & imaging results that were available during my care of the patient were reviewed by me and considered in my  medical decision making (see chart for details).    MARDELL SUTTLES is a 71 y.o. female who presents to ED at PCP request after lab work revealed hgb of 4. Patient states that she has felt weak for about a month, but no vaginal or GI bleeding. No emesis. Upon arrival, patient is mildly tachycardic, pale and weak. Repeat hgb 3.8. Hemoccult negative. Anemia panel obtained. Will transfuse 3U. Hospitalist consulted who will admit.  Patient seen by and discussed with Dr. Donnald Garre who agrees with treatment plan.   Final Clinical Impressions(s) / ED Diagnoses   Final diagnoses:  Symptomatic anemia    New Prescriptions New Prescriptions   No medications on file     Ward, Chase Picket, PA-C 05/20/17 1139    Arby Barrette, MD 05/20/17 1659

## 2017-05-20 NOTE — ED Notes (Signed)
Had difficulty getting vitals from pt  HR was going from 100-146 O2 was going from 98%-79%

## 2017-05-21 DIAGNOSIS — I10 Essential (primary) hypertension: Secondary | ICD-10-CM | POA: Diagnosis not present

## 2017-05-21 DIAGNOSIS — D649 Anemia, unspecified: Secondary | ICD-10-CM | POA: Diagnosis not present

## 2017-05-21 LAB — COMPREHENSIVE METABOLIC PANEL
ALT: 9 U/L — AB (ref 14–54)
ANION GAP: 5 (ref 5–15)
AST: 14 U/L — ABNORMAL LOW (ref 15–41)
Albumin: 3.1 g/dL — ABNORMAL LOW (ref 3.5–5.0)
Alkaline Phosphatase: 32 U/L — ABNORMAL LOW (ref 38–126)
BUN: 7 mg/dL (ref 6–20)
CHLORIDE: 110 mmol/L (ref 101–111)
CO2: 22 mmol/L (ref 22–32)
CREATININE: 0.78 mg/dL (ref 0.44–1.00)
Calcium: 8.2 mg/dL — ABNORMAL LOW (ref 8.9–10.3)
Glucose, Bld: 94 mg/dL (ref 65–99)
Potassium: 4.4 mmol/L (ref 3.5–5.1)
Sodium: 137 mmol/L (ref 135–145)
Total Bilirubin: 1.3 mg/dL — ABNORMAL HIGH (ref 0.3–1.2)
Total Protein: 5.1 g/dL — ABNORMAL LOW (ref 6.5–8.1)

## 2017-05-21 LAB — TYPE AND SCREEN
ABO/RH(D): A POS
Antibody Screen: NEGATIVE
Unit division: 0
Unit division: 0
Unit division: 0

## 2017-05-21 LAB — CBC
HCT: 25.2 % — ABNORMAL LOW (ref 36.0–46.0)
HEMOGLOBIN: 7.6 g/dL — AB (ref 12.0–15.0)
MCH: 20.7 pg — ABNORMAL LOW (ref 26.0–34.0)
MCHC: 30.2 g/dL (ref 30.0–36.0)
MCV: 68.5 fL — AB (ref 78.0–100.0)
PLATELETS: 264 10*3/uL (ref 150–400)
RBC: 3.68 MIL/uL — AB (ref 3.87–5.11)
RDW: 24.7 % — ABNORMAL HIGH (ref 11.5–15.5)
WBC: 3.9 10*3/uL — AB (ref 4.0–10.5)

## 2017-05-21 LAB — BPAM RBC
BLOOD PRODUCT EXPIRATION DATE: 201809302359
BLOOD PRODUCT EXPIRATION DATE: 201810022359
BLOOD PRODUCT EXPIRATION DATE: 201810022359
ISSUE DATE / TIME: 201809141309
ISSUE DATE / TIME: 201809141645
ISSUE DATE / TIME: 201809142320
UNIT TYPE AND RH: 6200
UNIT TYPE AND RH: 6200
Unit Type and Rh: 6200

## 2017-05-21 LAB — TSH: TSH: 0.917 u[IU]/mL (ref 0.350–4.500)

## 2017-05-21 MED ORDER — OXYCODONE HCL 5 MG PO TABS
5.0000 mg | ORAL_TABLET | Freq: Once | ORAL | Status: AC
  Administered 2017-05-21: 5 mg via ORAL
  Filled 2017-05-21: qty 1

## 2017-05-21 MED ORDER — FERROUS SULFATE 325 (65 FE) MG PO TABS: 325.0000 mg | ORAL_TABLET | Freq: Three times a day (TID) | ORAL | 0 refills | Status: DC

## 2017-05-21 NOTE — Progress Notes (Signed)
Patient discharged to home, all discharge medications and instructions reviewed.  Patient to be assisted to vehicle by wheelchair.

## 2017-05-21 NOTE — Discharge Summary (Signed)
Physician Discharge Summary  Sarah Miranda ZOX:[id number] DOB: 07/11/1946 DOA: 05/20/2017  PCP: Kermit Balo, DO  Admit date: 05/20/2017 Discharge date: 05/21/2017  Time spent: > 35  minutes  Recommendations for Outpatient Follow-up:  1. Monitor hgb levels. Patient was found to have Iron deficiency anemia (profound), will be discharge with iron supplementation.   Discharge Diagnoses:  Active Problems:   Fibromyalgia   Anxiety   Hypothyroidism   Bipolar disorder (HCC)   Essential hypertension   Symptomatic anemia   Discharge Condition: stable  Diet recommendation: regular diet  Filed Weights   05/20/17 0927  Weight: 61.2 kg (135 lb)    History of present illness:  71 y.o. female with medical history significant of anxiety, bipolar disorder, hypothyroidism, chronic pain/fibromyalgia, presents to the emergency room with chief complaint of weakness.  Patient has been experiencing weakness and difficulties with activities of daily living for the past month  Hospital Course:  Iron deficiency anemia - s/p transfusion of 3 units and IV Iron supplementation - hgb from 3.8 to 7.6. Pt feels much better. Offered another unit but patient refused. Would rather take Iron supplementation and f/u with pcp - Iron level was at 7 in house  For other known medical problems listed below continue prior to admission medication regimen listed below.  Procedures:  none  Consultations:  None  Discharge Exam: Vitals:   05/20/17 2345 05/21/17 0208  BP: (!) 113/52 113/60  Pulse: 75 79  Resp: 18 20  Temp: 98 F (36.7 C) 98.7 F (37.1 C)  SpO2: 99% 99%    General: Pt in nad, alert and awake Cardiovascular: rrr, no rubs Respiratory: no increased wob, no wheezes  Discharge Instructions   Discharge Instructions    Call MD for:  difficulty breathing, headache or visual disturbances    Complete by:  As directed    Call MD for:  extreme fatigue    Complete by:  As directed    Call MD for:  temperature >100.4    Complete by:  As directed    Diet - low sodium heart healthy    Complete by:  As directed    Discharge instructions    Complete by:  As directed    Please be sure to follow up with your primary care physician for further evaluation and recommendations.   Increase activity slowly    Complete by:  As directed      Current Discharge Medication List    START taking these medications   Details  ferrous sulfate 325 (65 FE) MG tablet Take 1 tablet (325 mg total) by mouth 3 (three) times daily with meals. Qty: 90 tablet, Refills: 0      CONTINUE these medications which have NOT CHANGED   Details  esomeprazole (NEXIUM 24HR) 20 MG capsule Take 20 mg by mouth daily as needed (for acid reflex).    FLUoxetine (PROZAC) 40 MG capsule TAKE 1 CAPSULE BY MOUTH DAILY TO HELP NERVES AND DEPRESSION Qty: 60 capsule, Refills: 2    levothyroxine (SYNTHROID, LEVOTHROID) 25 MCG tablet TAKE 1 TABLET BY MOUTH EVERY DAY Qty: 60 tablet, Refills: 2    Multiple Vitamin (MULTIVITAMIN WITH MINERALS) TABS tablet Take 1 tablet by mouth daily.    nystatin (MYCOSTATIN) 100000 UNIT/ML suspension 5ml by mouth four times daily, swish and spit Qty: 120 mL, Refills: 0    ondansetron (ZOFRAN) 4 MG tablet TAKE 1 TABLET BY MOUTH EVERY 8 HOURS AS NEEDED FOR NAUSEA Qty: 90 tablet, Refills: 0  Associated Diagnoses: Nausea without vomiting    oxycodone (ROXICODONE) 30 MG immediate release tablet Take one tablet every 6 hours as needed for pain. Qty: 120 tablet, Refills: 0   Associated Diagnoses: Fibromyalgia    pregabalin (LYRICA) 75 MG capsule Take 1 capsule (75 mg total) by mouth daily. Qty: 30 capsule, Refills: 0    risperiDONE (RISPERDAL) 1 MG tablet TAKE 1 TABLET BY MOUTH TWICE A DAY TO HELP RELAX Qty: 60 tablet, Refills: 2    senna (SENOKOT) 8.6 MG TABS tablet Take 1 tablet (8.6 mg total) by mouth daily as needed for mild constipation. Qty: 120 each, Refills: 0   Associated  Diagnoses: Therapeutic opioid induced constipation       Allergies  Allergen Reactions  . Fetzima [Levomilnacipran] Nausea Only      The results of significant diagnostics from this hospitalization (including imaging, microbiology, ancillary and laboratory) are listed below for reference.    Significant Diagnostic Studies: No results found.  Microbiology: No results found for this or any previous visit (from the past 240 hour(s)).   Labs: Basic Metabolic Panel:  Recent Labs Lab 05/19/17 1245 05/20/17 0956 05/21/17 0413  NA 135 135 137  K 4.7 3.4* 4.4  CL 100 102 110  CO2 GLUCOSE 110 126* 94  BUN CREATININE 0.80 0.83 0.78  CALCIUM 8.7 8.3* 8.2*   Liver Function Tests:  Recent Labs Lab 05/19/17 1245 05/20/17 0956 05/21/17 0413  AST 11 14* 14*  ALT 6 9* 9*  ALKPHOS  --  38 32*  BILITOT 0.4 0.6 1.3*  PROT 5.6* 5.5* 5.1*  ALBUMIN  --  3.6 3.1*   No results for input(s): LIPASE, AMYLASE in the last 168 hours. No results for input(s): AMMONIA in the last 168 hours. CBC:  Recent Labs Lab 05/19/17 1245 05/20/17 0956 05/21/17 0413  WBC 3.5* 3.2* 3.9*  NEUTROABS 1,502 1.3*  --   HGB 4.1* 3.8* 7.6*  HCT 17.6* 15.5* 25.2*  MCV 60.3* 60.1* 68.5*  PLT 374 344 264   Cardiac Enzymes: No results for input(s): CKTOTAL, CKMB, CKMBINDEX, TROPONINI in the last 168 hours. BNP: BNP (last 3 results) No results for input(s): BNP in the last 8760 hours.  ProBNP (last 3 results) No results for input(s): PROBNP in the last 8760 hours.  CBG: No results for input(s): GLUCAP in the last 168 hours.   Signed:  Penny Pia MD.  [facility] 05/21/2017, 9:44 AM

## 2017-05-22 LAB — HAPTOGLOBIN: HAPTOGLOBIN: 129 mg/dL (ref 34–200)

## 2017-05-23 ENCOUNTER — Telehealth: Payer: Self-pay

## 2017-05-23 LAB — METHYLMALONIC ACID, SERUM: Methylmalonic Acid, Quantitative: 169 nmol/L (ref 0–378)

## 2017-05-23 NOTE — Telephone Encounter (Signed)
Transition Care Management Follow-Up Telephone Call   Date discharged and where: [facility] on 05/21/17   How have you been since you were released from the hospital? Feel great since blood transfusion and medication change.  Any patient concerns? None  Items Reviewed:   Meds: Y  Allergies: Y  Dietary Changes Reviewed: Y  Functional Questionnaire:  Independent-I Dependent-D  ADLs:   Dressing- I    Eating- I   Maintaining continence- I   Transferring- I   Transportation- I   Meal Prep- I   Managing Meds- I  Confirmed importance and Date/Time of follow-up visits scheduled: Yes Dr. Renato Gails 05/30/17 @ 11:30am   Confirmed with patient if condition worsens to call PCP or go to the Emergency Dept. Patient was given office number and encouraged to call back with questions or concerns: Yes

## 2017-05-24 LAB — DRUG TOX MONITOR 1 W/CONF, ORAL FLD
Amphetamines: NEGATIVE ng/mL (ref <10)
Barbiturates: NEGATIVE ng/mL (ref <10)
Benzodiazepines: NEGATIVE ng/mL (ref <0.50)
Buprenorphine: NEGATIVE ng/mL (ref <0.025)
Buprenorphine: NEGATIVE ng/mL (ref <0.025)
Cocaine: NEGATIVE ng/mL (ref <2.5)
Codeine: NEGATIVE ng/mL (ref <2.5)
Dihydrocodeine: NEGATIVE ng/mL (ref <2.5)
Fentanyl: NEGATIVE ng/mL (ref <0.10)
Hydrocodone: 3.2 ng/mL — ABNORMAL HIGH (ref <2.5)
Hydromorphone: NEGATIVE ng/mL (ref <2.5)
MARIJUANA: NEGATIVE ng/mL (ref <2.5)
MDMA: NEGATIVE ng/mL (ref <10)
Meperidine: NEGATIVE ng/mL (ref <5.0)
Meprobamate: NEGATIVE ng/mL (ref <2.5)
Methadone: NEGATIVE ng/mL (ref <5.0)
Morphine: NEGATIVE ng/mL (ref <2.5)
Nicotine Metabolite: NEGATIVE ng/mL (ref <5.0)
Norbuprenorphine: NEGATIVE ng/mL (ref <0.25)
Norhydrocodone: NEGATIVE ng/mL (ref <2.5)
Noroxycodone: >250 ng/mL — ABNORMAL HIGH (ref <2.5)
Opiates: POSITIVE ng/mL — AB (ref <2.5)
Oxycodone: >250 ng/mL — ABNORMAL HIGH (ref <2.5)
Oxymorphone: 8.4 ng/mL — ABNORMAL HIGH (ref <2.5)
Phencyclidine: NEGATIVE ng/mL (ref <10)
Propoxyphene: NEGATIVE ng/mL (ref <5.0)
Tapentadol: NEGATIVE ng/mL (ref <5.0)
Tramadol: NEGATIVE ng/mL (ref <5.0)
Zolpidem: NEGATIVE ng/mL (ref <5.0)

## 2017-05-24 LAB — COMPLETE METABOLIC PANEL WITH GFR
AG Ratio: 1.9 (calc) (ref 1.0–2.5)
ALT: 6 U/L (ref 6–29)
AST: 11 U/L (ref 10–35)
Albumin: 3.7 g/dL (ref 3.6–5.1)
Alkaline phosphatase (APISO): 36 U/L (ref 33–130)
BUN: 8 mg/dL (ref 7–25)
CO2: 24 mmol/L (ref 20–32)
Calcium: 8.7 mg/dL (ref 8.6–10.4)
Chloride: 100 mmol/L (ref 98–110)
Creat: 0.8 mg/dL (ref 0.60–0.93)
GFR, Est African American: 86 mL/min/{1.73_m2} (ref >=60)
GFR, Est Non African American: 74 mL/min/{1.73_m2} (ref >=60)
Globulin: 1.9 g/dL (calc) (ref 1.9–3.7)
Glucose, Bld: 110 mg/dL (ref 65–139)
Potassium: 4.7 mmol/L (ref 3.5–5.3)
Sodium: 135 mmol/L (ref 135–146)
Total Bilirubin: 0.4 mg/dL (ref 0.2–1.2)
Total Protein: 5.6 g/dL — ABNORMAL LOW (ref 6.1–8.1)

## 2017-05-24 LAB — CBC WITH DIFFERENTIAL/PLATELET
Basophils Absolute: 11 cells/uL (ref 0–200)
Basophils Relative: 0.3 %
Eosinophils Absolute: 151 cells/uL (ref 15–500)
Eosinophils Relative: 4.3 %
HCT: 17.6 % — ABNORMAL LOW (ref 35.0–45.0)
Hemoglobin: 4.1 g/dL — CL (ref 11.7–15.5)
Lymphs Abs: 1313 cells/uL (ref 850–3900)
MCH: <15 pg — ABNORMAL LOW (ref 27.0–33.0)
MCHC: 23.3 g/dL — ABNORMAL LOW (ref 32.0–36.0)
MCV: 60.3 fL — ABNORMAL LOW (ref 80.0–100.0)
MPV: 9.6 fL (ref 7.5–12.5)
Monocytes Relative: 15 %
Neutro Abs: 1502 cells/uL (ref 1500–7800)
Neutrophils Relative %: 42.9 %
Platelets: 374 10*3/uL (ref 140–400)
RBC: 2.92 10*6/uL — ABNORMAL LOW (ref 3.80–5.10)
RDW: 19.6 % — ABNORMAL HIGH (ref 11.0–15.0)
Total Lymphocyte: 37.5 %
WBC mixed population: 525 cells/uL (ref 200–950)
WBC: 3.5 10*3/uL — ABNORMAL LOW (ref 3.8–10.8)

## 2017-05-24 LAB — HEMOGLOBIN A1C
Hgb A1c MFr Bld: 4.7 % of total Hgb (ref <5.7)
Mean Plasma Glucose: 88 (calc)
eAG (mmol/L): 4.9 (calc)

## 2017-05-24 LAB — CBC MORPHOLOGY

## 2017-05-24 LAB — TSH: TSH: 1.89 mIU/L (ref 0.40–4.50)

## 2017-05-25 ENCOUNTER — Other Ambulatory Visit: Payer: Self-pay | Admitting: Internal Medicine

## 2017-05-25 DIAGNOSIS — Z1231 Encounter for screening mammogram for malignant neoplasm of breast: Secondary | ICD-10-CM

## 2017-05-27 ENCOUNTER — Other Ambulatory Visit: Payer: Self-pay | Admitting: *Deleted

## 2017-05-27 DIAGNOSIS — M797 Fibromyalgia: Secondary | ICD-10-CM

## 2017-05-27 MED ORDER — OXYCODONE HCL 30 MG PO TABS: ORAL_TABLET | ORAL | 0 refills | Status: DC

## 2017-05-27 NOTE — Telephone Encounter (Signed)
Patient requested and will pick up [place] Database not checked due to no log in due to new set up.  

## 2017-05-30 ENCOUNTER — Encounter: Payer: Self-pay | Admitting: Internal Medicine

## 2017-05-30 ENCOUNTER — Ambulatory Visit (INDEPENDENT_AMBULATORY_CARE_PROVIDER_SITE_OTHER): Payer: Medicare Other | Admitting: Internal Medicine

## 2017-05-30 VITALS — BP 140/80 | HR 90 | Temp 97.9°F | Wt 131.0 lb

## 2017-05-30 DIAGNOSIS — M797 Fibromyalgia: Secondary | ICD-10-CM | POA: Diagnosis not present

## 2017-05-30 DIAGNOSIS — D649 Anemia, unspecified: Secondary | ICD-10-CM

## 2017-05-30 DIAGNOSIS — F3175 Bipolar disorder, in partial remission, most recent episode depressed: Secondary | ICD-10-CM

## 2017-05-30 DIAGNOSIS — Z23 Encounter for immunization: Secondary | ICD-10-CM

## 2017-05-30 LAB — CBC WITH DIFFERENTIAL/PLATELET
Basophils Absolute: 52 cells/uL (ref 0–200)
Basophils Relative: 0.7 %
Eosinophils Absolute: 222 cells/uL (ref 15–500)
Eosinophils Relative: 3 %
HCT: 34.3 % — ABNORMAL LOW (ref 35.0–45.0)
Hemoglobin: 9.9 g/dL — ABNORMAL LOW (ref 11.7–15.5)
Lymphs Abs: 1880 cells/uL (ref 850–3900)
MCH: 22.6 pg — ABNORMAL LOW (ref 27.0–33.0)
MCHC: 28.9 g/dL — ABNORMAL LOW (ref 32.0–36.0)
MCV: 78.3 fL — ABNORMAL LOW (ref 80.0–100.0)
MPV: 9.7 fL (ref 7.5–12.5)
Monocytes Relative: 7.2 %
Neutro Abs: 4714 cells/uL (ref 1500–7800)
Neutrophils Relative %: 63.7 %
Platelets: 286 10*3/uL (ref 140–400)
RBC: 4.38 10*6/uL (ref 3.80–5.10)
Total Lymphocyte: 25.4 %
WBC mixed population: 533 cells/uL (ref 200–950)
WBC: 7.4 10*3/uL (ref 3.8–10.8)

## 2017-05-30 MED ORDER — OXYCODONE HCL 30 MG PO TABS: 30.0000 mg | ORAL_TABLET | ORAL | 0 refills | Status: DC | PRN

## 2017-05-30 NOTE — Addendum Note (Signed)
Addended by: Kermit Balo on: 05/30/2017 03:47 PM   Modules accepted: Level of Service

## 2017-05-30 NOTE — Progress Notes (Signed)
Location:   [facility]   Place of Service:   Clinic  Provider: Aulani Shipton L. Renato Gails, D.O., C.M.D.  Code Status:  Goals of Care:  Advanced Directives 05/30/2017  Does Patient Have a Medical Advance Directive? Yes  Type of Advance Directive Healthcare Power of Attorney  Does patient want to make changes to medical advance directive? -  Copy of Healthcare Power of Attorney in Chart? Yes   Chief Complaint  Patient presents with  . Transitions Of Care    05/20/2017 - 05/21/2017 (24 hours)    HPI: Patient is a 71 y.o. female seen today for follow up from Hospital stay. Meds were reconciled and TOC phone call completed (9/17).    Anemia: States that she felt great when she left the hospital after receiving 3 units of blood. She did not stay for another unit as recommended.  States she has continued to decline since her hospital stay and is in a lot of pain today. It is her chronic pain issue but feels like it is worse today 10/10 scale. Is not short of breath anymore and has some color returned. States she is not having any constipation and that her stool is black from the iron supplements. Says that her stool is slightly looser than normal. HGB 7.6 on 9/15 up from 3.8. She is now on ferrous sulfate  three times daily. Was taking ibuprofen in place of one of her oxycodone.    Fibromyalgia/Chronic pain: 10/10 scale. Says that it is mostly her back but she aches all over and that this is not new. Takes oxycodone but questionable if it is as prescribed states that she hurts after 4 hours and has a hard time making it to 6 hours and Lyrica  daily. Occasionally takes a oxycodone at night but tries to save them for day time. Previous drug screen was positive for hydrocodone which she denies taking.  Says that at one time she went to [facility] clinic in [place] twice for her fibromyalgia.  Says she's happy with prozac and does not want to change to cymbalta.  She says she needs her medication every 4 hrs not  every 6 hrs.  Her husband reports she lies in bed screaming out in pain.  He says nothing helps her.  Discussed risks of such amounts of medication including falls, respiratory depression, overdose.  They refuse to go to pain mgt saying they've been to [place], [place] [facility], [facility] to psychiatry.  After much discussion, we agreed to go to  q 4 h prn pain for this month, but tapering attempts will be conducted on a monthly basis going forward since they won't agree to see a specialist for pain mgt or an addiction specialist.    Insomnia: says that she has been sleeping OK.   Says bipolar well controlled with prozac and risperdal.  Agrees to flu shot.   Past Medical History:  Diagnosis Date  . Abnormal involuntary movements(781.0)   . Alopecia, unspecified   . Anemia, unspecified   . Anxiety   . Bipolar disorder, unspecified (H[facility])   . Depression   . Dysuria   . Edema   . Encounter for long-term (current) use of other medications   . Esophageal reflux   . Fibromyalgia   . Hyperthyroidism   . Insomnia   . Lumbago   . Nonspecific elevation of levels of transaminase or lactic acid dehydrogenase (LDH)   . Other abnormal blood chemistry    hyperglycemia  . Other malaise and  fatigue   . Pathologic fracture of vertebrae   . Reflux esophagitis   . Syncope and collapse   . Tachycardia, unspecified   . Unspecified essential hypertension   . Unspecified hypothyroidism     Past Surgical History:  Procedure Laterality Date  . CHOLECYSTECTOMY  10/2010   complicated by bile leak  . ERCP  11/03/2010   for bile leak  . LAPAROSCOPIC TUBAL LIGATION    . TONSILLECTOMY      Allergies  Allergen Reactions  . Fetzima [Levomilnacipran] Nausea Only    Outpatient Encounter Prescriptions as of 05/30/2017  Medication Sig  . esomeprazole (NEXIUM 24HR) 20 MG capsule Take 20 mg by mouth daily as needed (for acid reflex).  . ferrous sulfate 325 (65 FE) MG tablet Take 1 tablet (325 mg total)  by mouth 3 (three) times daily with meals.  Marland Kitchen FLUoxetine (PROZAC) 40 MG capsule TAKE 1 CAPSULE BY MOUTH DAILY TO HELP NERVES AND DEPRESSION  . levothyroxine (SYNTHROID, LEVOTHROID) 25 MCG tablet TAKE 1 TABLET BY MOUTH EVERY DAY  . Multiple Vitamin (MULTIVITAMIN WITH MINERALS) TABS tablet Take 1 tablet by mouth daily.  Marland Kitchen nystatin (MYCOSTATIN) 100000 UNIT/ML suspension 5ml by mouth four times daily, swish and spit  . ondansetron (ZOFRAN) 4 MG tablet TAKE 1 TABLET BY MOUTH EVERY 8 HOURS AS NEEDED FOR NAUSEA  . oxycodone (ROXICODONE) 30 MG immediate release tablet Take one tablet every 6 hours as needed for pain.  . pregabalin (LYRICA) 75 MG capsule Take 1 capsule (75 mg total) by mouth daily.  . risperiDONE (RISPERDAL) 1 MG tablet Take 1 mg by mouth at bedtime.  . senna (SENOKOT) 8.6 MG TABS tablet Take 1 tablet (8.6 mg total) by mouth daily as needed for mild constipation.  . [DISCONTINUED] risperiDONE (RISPERDAL) 1 MG tablet TAKE 1 TABLET BY MOUTH TWICE A DAY TO HELP RELAX (Patient taking differently: TAKE 1 TABLET BY MOUTH once per day)   No facility-administered encounter medications on file as of 05/30/2017.     Review of Systems:  Review of Systems  Constitutional: Positive for malaise/fatigue. Negative for chills and fever.  HENT: Negative for congestion.   Eyes: Negative for blurred vision.  Respiratory: Negative for cough and shortness of breath.   Cardiovascular: Negative for chest pain and leg swelling.  Gastrointestinal: Positive for diarrhea, heartburn and nausea. Negative for blood in stool and melena.  Genitourinary: Negative for hematuria.  Musculoskeletal: Positive for back pain, joint pain and myalgias.  Skin: Negative for itching and rash.  Neurological: Positive for dizziness and weakness.  Psychiatric/Behavioral: Negative for memory loss. The patient does not have insomnia.        Bipolar    Health Maintenance  Topic Date Due  . Hepatitis C Screening  July 21, 1946  .  COLONOSCOPY  08/01/1995  . DEXA SCAN  07/31/2010  . INFLUENZA VACCINE  04/06/2017  . MAMMOGRAM  04/05/2018  . TETANUS/TDAP  10/12/2021  . PNA vac Low Risk Adult  Completed    Physical Exam: Vitals:   05/30/17 1147  BP: 140/80  Pulse: 90  Temp: 97.9 F (36.6 C)  TempSrc: Oral  SpO2: 99%  Weight: 131 lb (59.4 kg)   Body mass index is 22.49 kg/m. Physical Exam  Constitutional: She is oriented to person, place, and time. She appears well-developed and well-nourished.  HENT:  Head: Normocephalic and atraumatic.  Cardiovascular: Regular rhythm, normal heart sounds and intact distal pulses.   Pulmonary/Chest: Effort normal and breath sounds normal.  Abdominal: Soft.  Bowel sounds are normal.  Musculoskeletal: Normal range of motion.  Neurological: She is alert and oriented to person, place, and time.  Skin: Skin is warm and dry. Capillary refill takes less than 2 seconds.  Psychiatric: Her speech is normal and behavior is normal. Her affect is blunt.    Labs reviewed: Basic Metabolic Panel:  Recent Labs  45/40/98 1417 05/19/17 1245 05/20/17 0956 05/20/17 2122 05/21/17 0413  NA 131* 135 135  --  137  K 4.9 4.7 3.4*  --  4.4  CL 96* 100 102  --  110  CO2 --  22  GLUCOSE 87 110 126*  --  94  BUN --  7  CREATININE 0.83 0.80 0.83  --  0.78  CALCIUM 8.9 8.7 8.3*  --  8.2*  TSH 2.12 1.89  --  0.917  --    Liver Function Tests:  Recent Labs  12/22/16 1417 05/19/17 1245 05/20/17 0956 05/21/17 0413  AST 12 11 14* 14*  ALT 6 6 9* 9*  ALKPHOS 47  --  38 32*  BILITOT 0.3 0.4 0.6 1.3*  PROT 5.6* 5.6* 5.5* 5.1*  ALBUMIN 3.5*  --  3.6 3.1*   No results for input(s): LIPASE, AMYLASE in the last 8760 hours. No results for input(s): AMMONIA in the last 8760 hours. CBC:  Recent Labs  05/19/17 1245 05/20/17 0956 05/21/17 0413  WBC 3.5* 3.2* 3.9*  NEUTROABS 1,502 1.3*  --   HGB 4.1* 3.8* 7.6*  HCT 17.6* 15.5* 25.2*  MCV 60.3* 60.1* 68.5*  PLT 374  344 264   Lipid Panel: No results for input(s): CHOL, HDL, LDLCALC, TRIG, CHOLHDL, LDLDIRECT in the last 8760 hours. Lab Results  Component Value Date   HGBA1C 4.7 05/19/2017    Assessment/Plan 1. Symptomatic anemia Follow up with GI--previously saw Dr. Leone Payor for prior colonoscopies. Continue iron supplements. Recheck H&H today.   2. Fibromyalgia Takes lyrica and oxycodone but feels like her pain is not controlled. After much discussion today, she was out of medication due to taking every 4 hrs so we did agree just for this month to increase to every 4hrs, but will be working on a taper from this point on out and I'll be seeing her about monthly until we can get this addressed; she and her husband refuse referrals to pain mgt and addiction medicine as I recommended so this was a compromise Advised NOT to take ibuprofen, but use tylenol for breakthrough pain  3.  Bipolar disorder:  Cont prozac and risperdal.    4.  Need for flu shot -given  Labs/tests ordered:   Orders Placed This Encounter  Procedures  . Flu vaccine HIGH DOSE PF (Fluzone High dose)  . CBC with Differential/Platelet  . Ambulatory referral to Gastroenterology    Referral Priority:   Routine    Referral Type:   Consultation    Referral Reason:   Specialty Services Required    Number of Visits Requested:   1    Next appt:  4 wks for f/u on pain   Jahlisa Rossitto L. Galit Urich, D.O. Geriatrics [facility] [address] [place], [place] [address] Cell Phone (Mon-Fri 8am-5pm):  [phone] On Call:  [phone] & follow prompts after 5pm & weekends Office Phone:  [phone] Office Fax:  [phone]

## 2017-05-31 ENCOUNTER — Encounter: Payer: Self-pay | Admitting: Internal Medicine

## 2017-05-31 ENCOUNTER — Encounter: Payer: Self-pay | Admitting: *Deleted

## 2017-05-31 ENCOUNTER — Telehealth: Payer: Self-pay | Admitting: *Deleted

## 2017-05-31 NOTE — Telephone Encounter (Signed)
Patient called and wanted to let Geraldine Contras know that she is scheduled to see Dr. Leone Payor 08/02/2017 @ 1:45 stated that was the soonest appointment she could get.

## 2017-05-31 NOTE — Telephone Encounter (Signed)
Dr. Renato Gails is this ok?

## 2017-05-31 NOTE — Telephone Encounter (Signed)
That's ok.  Her hgb is trending up with the iron which is a good sign.

## 2017-05-31 NOTE — Telephone Encounter (Signed)
ok 

## 2017-06-01 ENCOUNTER — Ambulatory Visit: Payer: Medicare Other | Admitting: Internal Medicine

## 2017-06-07 ENCOUNTER — Ambulatory Visit: Payer: Medicare Other

## 2017-06-13 ENCOUNTER — Other Ambulatory Visit: Payer: Self-pay | Admitting: Internal Medicine

## 2017-06-15 ENCOUNTER — Ambulatory Visit: Payer: Medicare Other

## 2017-06-16 ENCOUNTER — Ambulatory Visit: Payer: Medicare Other | Admitting: Internal Medicine

## 2017-06-17 ENCOUNTER — Telehealth: Payer: Self-pay | Admitting: Internal Medicine

## 2017-06-17 NOTE — Telephone Encounter (Signed)
Left message asking pt to call and schedule AWV in November. VDM (DD)

## 2017-06-27 ENCOUNTER — Ambulatory Visit
Admission: RE | Admit: 2017-06-27 | Discharge: 2017-06-27 | Disposition: A | Discharge status: Home / Self Care | Payer: Medicare Other | Source: Ambulatory Visit | Attending: Internal Medicine | Admitting: Internal Medicine

## 2017-06-27 DIAGNOSIS — Z1231 Encounter for screening mammogram for malignant neoplasm of breast: Secondary | ICD-10-CM | POA: Diagnosis not present

## 2017-06-30 ENCOUNTER — Telehealth: Payer: Self-pay | Admitting: *Deleted

## 2017-06-30 DIAGNOSIS — M797 Fibromyalgia: Secondary | ICD-10-CM

## 2017-06-30 MED ORDER — OXYCODONE HCL 30 MG PO TABS: 30.0000 mg | ORAL_TABLET | Freq: Four times a day (QID) | ORAL | 0 refills | Status: DC | PRN

## 2017-06-30 NOTE — Telephone Encounter (Signed)
Rx printed with updated directions. Placed for Dr. Renato Gailseed to review and sign.

## 2017-06-30 NOTE — Telephone Encounter (Signed)
Patient is calling for her Pain Medication Oxycodone. There is note speaking of tapering the pain medication. Patient wants to pick up today. LR per [place] Narcotic Database was 05/30/2017. Please Advise.

## 2017-07-01 ENCOUNTER — Telehealth: Payer: Self-pay

## 2017-07-01 NOTE — Telephone Encounter (Signed)
I called patient to confirm which rx her husband signed out today for Dr.Reed gave me a rx for Oxycodone about 12:00pm and questions if rx was already written and signed.   Steward DroneBrenda confirmed rx at home was for Oxycodone 30 mg 1 by mouth every 6 hours as needed #120, rx that I had in hand was voided.

## 2017-07-11 ENCOUNTER — Ambulatory Visit: Payer: Self-pay | Admitting: Internal Medicine

## 2017-07-20 ENCOUNTER — Other Ambulatory Visit: Payer: Self-pay | Admitting: *Deleted

## 2017-07-20 MED ORDER — PREGABALIN 75 MG PO CAPS: 75.0000 mg | ORAL_CAPSULE | Freq: Every day | ORAL | 0 refills | Status: DC

## 2017-07-20 NOTE — Telephone Encounter (Signed)
Patient requested Rx. Printed.

## 2017-07-26 ENCOUNTER — Ambulatory Visit: Payer: Medicare Other | Admitting: Internal Medicine

## 2017-07-27 ENCOUNTER — Other Ambulatory Visit: Payer: Self-pay | Admitting: *Deleted

## 2017-07-27 DIAGNOSIS — M797 Fibromyalgia: Secondary | ICD-10-CM

## 2017-07-27 MED ORDER — OXYCODONE HCL 30 MG PO TABS: 30.0000 mg | ORAL_TABLET | Freq: Four times a day (QID) | ORAL | 0 refills | Status: DC | PRN

## 2017-07-27 NOTE — Telephone Encounter (Signed)
Patient requested and will pick up NCCSRS Database Verified.  

## 2017-08-02 ENCOUNTER — Ambulatory Visit: Payer: Medicare Other | Admitting: Internal Medicine

## 2017-08-26 ENCOUNTER — Other Ambulatory Visit: Payer: Self-pay | Admitting: *Deleted

## 2017-08-26 DIAGNOSIS — M797 Fibromyalgia: Secondary | ICD-10-CM

## 2017-08-26 MED ORDER — OXYCODONE HCL 30 MG PO TABS: 30.0000 mg | ORAL_TABLET | Freq: Four times a day (QID) | ORAL | 0 refills | Status: DC | PRN

## 2017-08-26 NOTE — Telephone Encounter (Deleted)
Patient requested NCCSRS Database Verified Pharmacy Confirmed Pended Rx and sent to Dr. Reed for approval.  

## 2017-08-26 NOTE — Telephone Encounter (Signed)
Patient requested NCCSRS Database Verified Pharmacy confirmed Sent to [facility] due for approval due to Dr. Salome Holmeseef off.

## 2017-09-07 ENCOUNTER — Other Ambulatory Visit: Payer: Self-pay | Admitting: Nurse Practitioner

## 2017-09-07 NOTE — Telephone Encounter (Signed)
Ok to fill? Database checked and verified Last filled 07/20/17

## 2017-09-08 NOTE — Telephone Encounter (Signed)
rx called into pharmacy Spoke with patient and advised results, she understands.

## 2017-09-08 NOTE — Telephone Encounter (Signed)
Pt needs to schedule and actually keep her appointments.  The plan if we were to continue dispensing her pain medications was frequent visits to address her pain.  We can give her one more month's supply of the lyrica, but she will not receive any further refills after that of anything until I see her in the office.

## 2017-09-12 ENCOUNTER — Other Ambulatory Visit: Payer: Self-pay | Admitting: Internal Medicine

## 2017-09-12 DIAGNOSIS — R110 Nausea: Secondary | ICD-10-CM

## 2017-09-15 ENCOUNTER — Ambulatory Visit (INDEPENDENT_AMBULATORY_CARE_PROVIDER_SITE_OTHER): Payer: Medicare Other | Admitting: Internal Medicine

## 2017-09-15 ENCOUNTER — Encounter: Payer: Self-pay | Admitting: Internal Medicine

## 2017-09-15 VITALS — BP 120/70 | HR 81 | Temp 98.0°F | Wt 152.0 lb

## 2017-09-15 DIAGNOSIS — E2839 Other primary ovarian failure: Secondary | ICD-10-CM | POA: Diagnosis not present

## 2017-09-15 DIAGNOSIS — F3175 Bipolar disorder, in partial remission, most recent episode depressed: Secondary | ICD-10-CM | POA: Diagnosis not present

## 2017-09-15 DIAGNOSIS — M797 Fibromyalgia: Secondary | ICD-10-CM | POA: Diagnosis not present

## 2017-09-15 DIAGNOSIS — R739 Hyperglycemia, unspecified: Secondary | ICD-10-CM

## 2017-09-15 DIAGNOSIS — F1120 Opioid dependence, uncomplicated: Secondary | ICD-10-CM

## 2017-09-15 DIAGNOSIS — E039 Hypothyroidism, unspecified: Secondary | ICD-10-CM

## 2017-09-15 DIAGNOSIS — D649 Anemia, unspecified: Secondary | ICD-10-CM

## 2017-09-15 LAB — COMPLETE METABOLIC PANEL WITH GFR
AG Ratio: 2.1 (calc) (ref 1.0–2.5)
ALT: 8 U/L (ref 6–29)
AST: 13 U/L (ref 10–35)
Albumin: 3.7 g/dL (ref 3.6–5.1)
Alkaline phosphatase (APISO): 37 U/L (ref 33–130)
BUN: 9 mg/dL (ref 7–25)
CO2: 30 mmol/L (ref 20–32)
Calcium: 8.3 mg/dL — ABNORMAL LOW (ref 8.6–10.4)
Chloride: 99 mmol/L (ref 98–110)
Creat: 0.76 mg/dL (ref 0.60–0.93)
GFR, Est African American: 91 mL/min/{1.73_m2} (ref >=60)
GFR, Est Non African American: 78 mL/min/{1.73_m2} (ref >=60)
Globulin: 1.8 g/dL (calc) — ABNORMAL LOW (ref 1.9–3.7)
Glucose, Bld: 105 mg/dL — ABNORMAL HIGH (ref 65–99)
Potassium: 4.4 mmol/L (ref 3.5–5.3)
Sodium: 135 mmol/L (ref 135–146)
Total Bilirubin: 0.3 mg/dL (ref 0.2–1.2)
Total Protein: 5.5 g/dL — ABNORMAL LOW (ref 6.1–8.1)

## 2017-09-15 LAB — CBC WITH DIFFERENTIAL/PLATELET
Basophils Absolute: 30 cells/uL (ref 0–200)
Basophils Relative: 0.5 %
Eosinophils Absolute: 254 cells/uL (ref 15–500)
Eosinophils Relative: 4.3 %
HCT: 36.3 % (ref 35.0–45.0)
Hemoglobin: 12.3 g/dL (ref 11.7–15.5)
Lymphs Abs: 1988 cells/uL (ref 850–3900)
MCH: 31.4 pg (ref 27.0–33.0)
MCHC: 33.9 g/dL (ref 32.0–36.0)
MCV: 92.6 fL (ref 80.0–100.0)
MPV: 9 fL (ref 7.5–12.5)
Monocytes Relative: 9 %
Neutro Abs: 3098 cells/uL (ref 1500–7800)
Neutrophils Relative %: 52.5 %
Platelets: 186 10*3/uL (ref 140–400)
RBC: 3.92 10*6/uL (ref 3.80–5.10)
RDW: 12.2 % (ref 11.0–15.0)
Total Lymphocyte: 33.7 %
WBC mixed population: 531 cells/uL (ref 200–950)
WBC: 5.9 10*3/uL (ref 3.8–10.8)

## 2017-09-15 LAB — TSH: TSH: 3.06 mIU/L (ref 0.40–4.50)

## 2017-09-15 MED ORDER — AMITRIPTYLINE HCL 25 MG PO TABS: 25.0000 mg | ORAL_TABLET | Freq: Every day | ORAL | 3 refills | Status: DC

## 2017-09-15 NOTE — Progress Notes (Signed)
Location:  [facility] clinic Provider:  Ezreal Turay L. Mariea Clonts, D.O., C.M.D.   Code Status: Full Code Goals of Care:  Advanced Directives 05/30/2017  Does Patient Have a Medical Advance Directive? Yes  Type of Advance Directive [place]  Does patient want to make changes to medical advance directive? -  Copy of [place] in Chart? Yes    HPI: Patient is a 72 y.o. female seen today for medical management of chronic diseases including her fibromyalgia, anemia, bipolar disorder. Today in the office she is accompanied by her husband, Rush Landmark. Her only complaint was some pain over the holiday time, she attributed this to the reduction in her oxycodone and the rainy weather. She request to have her medicine remain as is and not to have it reduced--she has been advised at prior visits and during phone calls that her opioid therapy will be gradually tapered as it is not the best treatment for fibromyalgia and due to [facility] guidelines. She declines a referral at this time to a pain clinic, but is willing to consider it in the future--continues to decline pain mgt.  Her husband describes that she is writhing in pain and crying out in the bed--she says this makes her feel better.  He says it makes him miserable.     Overall she has noticed some vision changes--blurred but her eye doctor appt is coming up in Feb.   She is sleeping well when she does sleep, and admits to taking an extra pill over the evening hours when she is in pain.   She has gained her weight back and her color is much improved from previous visit when she was found to be overtly anemic into the 4 range. She reports no changes in bowel. She's had no blood in her stool or dark stool.  She's refused a colonoscopy despite extensive counseling and recommendations by me and the hospital physicians before she left AMA during her stay for anemia.    She notes some urgency with voiding, but she denies incontinence.   There  are no reported memory issues from her or her husband.   She has not had any falls and there are not reported skin issues. She is much more steady with less opioids, based on our assessment in the office and also due to improved hgb.    She is well put-together today, wearing makeup, having had her hair and nails done recently.    Past Medical History:  Diagnosis Date  . Abnormal involuntary movements(781.0)   . Alopecia, unspecified   . Anemia, unspecified   . Anxiety   . Bipolar disorder, unspecified ([place])   . Depression   . Dysuria   . Edema   . Encounter for long-term (current) use of other medications   . Esophageal reflux   . Fibromyalgia   . Hyperthyroidism   . Insomnia   . Lumbago   . Nonspecific elevation of levels of transaminase or lactic acid dehydrogenase (LDH)   . Other abnormal blood chemistry    hyperglycemia  . Other malaise and fatigue   . Pathologic fracture of vertebrae   . Reflux esophagitis   . Syncope and collapse   . Tachycardia, unspecified   . Unspecified essential hypertension   . Unspecified hypothyroidism     Past Surgical History:  Procedure Laterality Date  . CHOLECYSTECTOMY  09/4968   complicated by bile leak  . ERCP  11/03/2010   for bile leak  . LAPAROSCOPIC  TUBAL LIGATION    . TONSILLECTOMY      Allergies  Allergen Reactions  . Fetzima [Levomilnacipran] Nausea Only    Outpatient Encounter Medications as of 09/15/2017  Medication Sig  . esomeprazole (NEXIUM 24HR) 20 MG capsule Take 20 mg by mouth daily as needed (for acid reflex).  . ferrous sulfate 325 (65 FE) MG tablet Take 1 tablet (325 mg total) by mouth 3 (three) times daily with meals.  Marland Kitchen FLUoxetine (PROZAC) 40 MG capsule TAKE 1 CAPSULE BY MOUTH DAILY TO HELP NERVES AND DEPRESSION  . levothyroxine (SYNTHROID, LEVOTHROID) 25 MCG tablet TAKE 1 TABLET BY MOUTH EVERY DAY  . LYRICA 75 MG capsule TAKE ONE CAPSULE BY MOUTH EVERY DAY  . Multiple Vitamin (MULTIVITAMIN WITH MINERALS)  TABS tablet Take 1 tablet by mouth daily.  Marland Kitchen nystatin (MYCOSTATIN) 100000 UNIT/ML suspension 26m by mouth four times daily, swish and spit  . ondansetron (ZOFRAN) 4 MG tablet TAKE 1 TABLET BY MOUTH EVERY 8 HOURS AS NEEDED FOR NAUSEA  . oxycodone (ROXICODONE) 30 MG immediate release tablet Take 1 tablet (30 mg total) by mouth every 6 (six) hours as needed (severe pain).  . risperiDONE (RISPERDAL) 1 MG tablet TAKE 1 TABLET BY MOUTH TWICE DAILY TO HELP RELAX  . senna (SENOKOT) 8.6 MG TABS tablet Take 1 tablet (8.6 mg total) by mouth daily as needed for mild constipation.   No facility-administered encounter medications on file as of 09/15/2017.     Review of Systems:  Review of Systems  Constitutional: Positive for malaise/fatigue. Negative for chills and fever.  HENT: Negative.   Eyes: Positive for blurred vision.  Respiratory: Negative.   Cardiovascular: Negative.   Gastrointestinal: Negative.   Genitourinary: Positive for urgency.  Musculoskeletal: Positive for myalgias.  Skin: Negative.   Neurological: Positive for headaches.  Endo/Heme/Allergies: Negative.   Psychiatric/Behavioral: Positive for depression. Negative for memory loss. The patient is nervous/anxious.   ROS as per student correct above.  Health Maintenance  Topic Date Due  . Hepatitis C Screening  [birth year] . COLONOSCOPY  08/01/1995  . DEXA SCAN  07/31/2010  . MAMMOGRAM  06/28/2019  . TETANUS/TDAP  10/12/2021  . INFLUENZA VACCINE  Completed  . PNA vac Low Risk Adult  Completed    Physical Exam: Physical Exam  Constitutional: She is oriented to person, place, and time. She appears well-developed and well-nourished. She is cooperative.  HENT:  Head: Normocephalic and atraumatic.  Nose: Nose normal.  Eyes: Conjunctivae are normal. Pupils are equal, round, and reactive to light.  Neck: Normal range of motion.  Cardiovascular: Normal rate, regular rhythm, normal heart sounds and intact distal pulses.    Pulmonary/Chest: Effort normal and breath sounds normal.  Abdominal: Soft. Bowel sounds are normal.  Musculoskeletal: Normal range of motion.  Neurological: She is alert and oriented to person, place, and time. She has normal strength. She displays tremor.  Mild tremor bilaterally with extension  Skin: Skin is warm and dry. Capillary refill takes less than 2 seconds.  Psychiatric: Her speech is normal and behavior is normal. Judgment and thought content normal. Her mood appears anxious. Cognition and memory are normal.  PE as per student--agree as above  Labs reviewed: Basic Metabolic Panel: Recent Labs    12/22/16 1417 05/19/17 1245 05/20/17 0956 05/20/17 2122 05/21/17 0413  NA 131* 135 135  --  137  K 4.9 4.7 3.4*  --  4.4  CL 96* 100 102  --  110  CO2 '26 24 24  '$ --  22  GLUCOSE 87 110 126*  --  94  BUN '10 8 9  '$ --  7  CREATININE 0.83 0.80 0.83  --  0.78  CALCIUM 8.9 8.7 8.3*  --  8.2*  TSH 2.12 1.89  --  0.917  --    Liver Function Tests: Recent Labs    12/22/16 1417 05/19/17 1245 05/20/17 0956 05/21/17 0413  AST 12 11 14* 14*  ALT 6 6 9* 9*  ALKPHOS 47  --  38 32*  BILITOT 0.3 0.4 0.6 1.3*  PROT 5.6* 5.6* 5.5* 5.1*  ALBUMIN 3.5*  --  3.6 3.1*   No results for input(s): LIPASE, AMYLASE in the last 8760 hours. No results for input(s): AMMONIA in the last 8760 hours. CBC: Recent Labs    05/19/17 1245 05/20/17 0956 05/21/17 0413 05/30/17 1258  WBC 3.5* 3.2* 3.9* 7.4  NEUTROABS 1,502 1.3*  --  4,714  HGB 4.1* 3.8* 7.6* 9.9*  HCT 17.6* 15.5* 25.2* 34.3*  MCV 60.3* 60.1* 68.5* 78.3*  PLT 374 344 264 286   Lipid Panel: No results for input(s): CHOL, HDL, LDLCALC, TRIG, CHOLHDL, LDLDIRECT in the last 8760 hours. Lab Results  Component Value Date   HGBA1C 4.7 05/19/2017    Procedures since last visit: No results found.  Assessment/Plan 1. Fibromyalgia  This is an on-going discussion regarding pain control with symptoms. There was extensive discussion  about a referral to a pain clinic for management of her fibromyalgia. She is not willing to go--she has not changed her mind about seeing pain mgt. She states she will think about it to student. She is being treated right now with lyrica, prozac, oxycodone. She is not exercising which is a proven benefit of fibromyalgia--exercise has been discussed since I've met her and she does not do this. She was educated on these and encouraged to start walking. Her current dose of oxycodone is '30mg'$  every 6 hours and she states she has adjusted to this and does not want it changed again--I've already reduced it from every 4 hrs to every 6 hrs. At this time we will continue this dose, but will add amitriptyline at night to aid in continued management of pain symptoms. The next refill will be at a reduced dosage.  We will continue to advise her and encourage her to go to a pain clinic as well.  Her husband also reports pain concerns, ongoing concerns about her and requests opioid therapy for himself, as well.  2. Estrogen deficiency She is post menopausal and this places her at a higher risk for osteoporosis. Therefore we will get a DXA scan on her to evaluate this and managed as needed.  She says she will get the bone density  - DG Bone Density; Future  3. Symptomatic anemia In September she was found to have a hemoglobin of 3.8 and was sent to the hospital. She was transfused and had a stable hemoglobin of 9.9 at the last check. She has maintained and is tolerating iron supplementation. Will reevaluate this with CBC today in office.The etiology of the anemia is still unclear, FOBT is ordered to screen for possible gi culprit since she refuses to get a colonoscopy as recommended and cologuard is not indicated with symptomatic anemia only low risk individuals.  - CBC with Differential/Platelet - COMPLETE METABOLIC PANEL WITH GFR - Fecal Globin By Immunochemistry; Future  4. Bipolar disorder, in partial remission,  most recent episode depressed ([place]) Previous PCP was prescribing her prozac and risperdal--refuses  to change.  Did agree to try amitriptyline which is better than opioid therapy despite Beer's status. -Might up risperdal in the future per more recent guidelines in managing fibromyalgia--may help her to rest better  5. Hypothyroidism, unspecified type F/u tsh, clinically euthyroid - TSH  6. Hyperglycemia -check bmp  7. Uncomplicated opioid dependence [facility]) Patient as discussed above has developed a dependency to opioids and is being weaned down on her oxycodone. She is unwilling to go to a pain clinic at this time, so on-going management with compliance to her a pain contract will be continued at our clinic. We well look into further reduction and wean of her oxycodone as we can and will off set the reduction with better and evidence based treatment of fibromyalgia.  See #1   Labs/tests ordered:   Orders Placed This Encounter  Procedures  . DG Bone Density    Standing Status:   Future    Standing Expiration Date:   11/14/2018    Order Specific Question:   Reason for Exam (SYMPTOM  OR DIAGNOSIS REQUIRED)    Answer:   postmenopausal; estrogen deficient    Order Specific Question:   Preferred imaging location?    Answer:   [facility]  . CBC with Differential/Platelet  . COMPLETE METABOLIC PANEL WITH GFR  . TSH  . Fecal Globin By Immunochemistry    Standing Status:   Future    Number of Occurrences:   1    Standing Expiration Date:   09/15/2018   Next appt:  01/16/2018, med mgt  Portions of note above completed by student NP, Karen Kays, RN--I agree with these as written with supplemental comments and notes by me.  Pt was seen and examined by me and all of the above reviewed with her and her husband.    Declin Rajan L. Chasten Blaze, D.O. [facility] [address], [place] [address] Cell Phone (Mon-Fri 8am-5pm):  [phone] On  Call:  [phone] & follow prompts after 5pm & weekends Office Phone:  [phone] Office Fax:  [phone]

## 2017-09-17 DIAGNOSIS — D649 Anemia, unspecified: Secondary | ICD-10-CM | POA: Diagnosis not present

## 2017-09-19 ENCOUNTER — Encounter: Payer: Self-pay | Admitting: *Deleted

## 2017-09-19 ENCOUNTER — Other Ambulatory Visit: Payer: Medicare Other

## 2017-09-19 DIAGNOSIS — D649 Anemia, unspecified: Secondary | ICD-10-CM

## 2017-09-20 LAB — FECAL GLOBIN BY IMMUNOCHEMISTRY
FECAL GLOBIN RESULT:: NOT DETECTED
MICRO NUMBER:: 90056374
SPECIMEN QUALITY:: ADEQUATE

## 2017-09-22 ENCOUNTER — Encounter: Payer: Self-pay | Admitting: *Deleted

## 2017-09-23 ENCOUNTER — Other Ambulatory Visit: Payer: Self-pay | Admitting: *Deleted

## 2017-09-23 DIAGNOSIS — M797 Fibromyalgia: Secondary | ICD-10-CM

## 2017-09-23 MED ORDER — OXYCODONE HCL 15 MG PO TABS: 15.0000 mg | ORAL_TABLET | Freq: Four times a day (QID) | ORAL | 0 refills | Status: DC | PRN

## 2017-09-23 NOTE — Telephone Encounter (Signed)
Patient requested NCCSRS Database Verified Pharmacy Confirmed Pended Rx and sent to Dr. Reed for approval.  

## 2017-09-23 NOTE — Telephone Encounter (Signed)
Patient called. Gave explanation and she stated that this was not going to work but stated she will give it a try and call us if she needed to come in and discuss.

## 2017-09-23 NOTE — Telephone Encounter (Signed)
As planned, her oxycodone dose was reduced.  She is now on the amitriptyline for pain to help her fibromyalgia, as well, which should help cover her.

## 2017-09-25 DIAGNOSIS — F1120 Opioid dependence, uncomplicated: Secondary | ICD-10-CM | POA: Insufficient documentation

## 2017-09-27 ENCOUNTER — Ambulatory Visit: Payer: Medicare Other | Admitting: Internal Medicine

## 2017-10-06 ENCOUNTER — Encounter: Payer: Self-pay | Admitting: Internal Medicine

## 2017-10-19 ENCOUNTER — Other Ambulatory Visit: Payer: Self-pay | Admitting: Internal Medicine

## 2017-10-19 NOTE — Telephone Encounter (Signed)
rx called into pharmacy

## 2017-10-21 ENCOUNTER — Other Ambulatory Visit: Payer: Self-pay | Admitting: *Deleted

## 2017-10-21 DIAGNOSIS — M797 Fibromyalgia: Secondary | ICD-10-CM

## 2017-10-21 MED ORDER — OXYCODONE HCL 10 MG PO TABS: 10.0000 mg | ORAL_TABLET | Freq: Four times a day (QID) | ORAL | 0 refills | Status: DC | PRN

## 2017-10-21 NOTE — Telephone Encounter (Signed)
Patient called requesting refill on her pain medication NCCSRS Database Verified Pharmacy confirmed Pended Rx and sent to Dr. Renato Gailseed for approval.

## 2017-10-21 NOTE — Telephone Encounter (Signed)
Continuing gradual reduction in opioid therapy as discussed. Alternative is to go to a local pain clinic.

## 2017-11-04 ENCOUNTER — Ambulatory Visit: Payer: Self-pay | Admitting: Nurse Practitioner

## 2017-11-16 ENCOUNTER — Telehealth: Payer: Self-pay | Admitting: *Deleted

## 2017-11-16 DIAGNOSIS — M797 Fibromyalgia: Secondary | ICD-10-CM

## 2017-11-16 MED ORDER — OXYCODONE HCL 10 MG PO TABS: 10.0000 mg | ORAL_TABLET | Freq: Three times a day (TID) | ORAL | 0 refills | Status: DC | PRN

## 2017-11-16 NOTE — Telephone Encounter (Signed)
Patient called wanting a refill on her Narcotic Medication. Stated she wanted it increased because the Oxycodone 10mg  is not controlling her pain. Stated that it is hard to get going with the pain. Needs relief. Requesting something stronger. Needs faxed to pharmacy. Please Advise.   NCCSRS Database Verified LR 10/21/17 Pharmacy Confirmed Did not pend Rx due to possible change.

## 2017-11-16 NOTE — Telephone Encounter (Signed)
As discussed at her appointments, opioid pain medications will gradually be reduced if she chooses to continue to see me for her pain management.  Fibromyalgia pain is not to be managed with opioids.  Water aerobics, lyrica, regular exercise are recommended.  She was started on the lyrica for her fibromyalgia along with the amitripytyline.  (Notably, her husband is calling requesting more pain medicine for himself when hers is being reduced--I am concerned about this because he always spends much of their visit talking about her "crying out in pain" at night and not knowing what to do about it.)    Letisia Schwalb L. Nephtali Docken, D.O. Geriatrics [facility] [address] [place], [place] [address] Cell Phone (Mon-Fri 8am-5pm):  [phone] On Call:  [phone] & follow prompts after 5pm & weekends Office Phone:  [phone] Office Fax:  [phone]

## 2017-11-22 DIAGNOSIS — M9902 Segmental and somatic dysfunction of thoracic region: Secondary | ICD-10-CM | POA: Diagnosis not present

## 2017-11-22 DIAGNOSIS — M9903 Segmental and somatic dysfunction of lumbar region: Secondary | ICD-10-CM | POA: Diagnosis not present

## 2017-11-22 DIAGNOSIS — M9901 Segmental and somatic dysfunction of cervical region: Secondary | ICD-10-CM | POA: Diagnosis not present

## 2017-11-22 DIAGNOSIS — M4722 Other spondylosis with radiculopathy, cervical region: Secondary | ICD-10-CM | POA: Diagnosis not present

## 2017-11-30 ENCOUNTER — Other Ambulatory Visit: Payer: Self-pay | Admitting: Internal Medicine

## 2017-11-30 NOTE — Telephone Encounter (Signed)
A medication refill was received from pharmacy for lyrica 75 mg. Rx was pended to provider for approval  after verifying last fill date, provider, and quantity on PMP [place] database.

## 2017-12-12 DIAGNOSIS — Z7689 Persons encountering health services in other specified circumstances: Secondary | ICD-10-CM | POA: Diagnosis not present

## 2017-12-12 DIAGNOSIS — I10 Essential (primary) hypertension: Secondary | ICD-10-CM | POA: Diagnosis not present

## 2017-12-15 ENCOUNTER — Encounter: Payer: Self-pay | Admitting: *Deleted

## 2017-12-15 ENCOUNTER — Other Ambulatory Visit: Payer: Self-pay | Admitting: *Deleted

## 2017-12-15 DIAGNOSIS — M797 Fibromyalgia: Secondary | ICD-10-CM

## 2017-12-15 MED ORDER — OXYCODONE HCL 5 MG PO CAPS: 5.0000 mg | ORAL_CAPSULE | Freq: Three times a day (TID) | ORAL | 0 refills | Status: DC | PRN

## 2017-12-15 NOTE — Telephone Encounter (Signed)
As discussed at visits and by phone on multiple occasions, medication is gradually being tapered because opioid therapy is not indicated for fibromyalgia pain.

## 2017-12-15 NOTE — Telephone Encounter (Signed)
Patient called and requested refill on her Narcotic. Patient also stated please not reduce it anymore because the 10mg  is barely working as it is.  NCCSRS Database Verified LR: 11/18/17  Pharmacy Confirmed Pended Rx and sent to Dr. Renato Gailseed for approval.

## 2017-12-15 NOTE — Telephone Encounter (Signed)
Patient stated that she went to a chiropractor and she has had a broken back in the past. Patient stated that she is going to get him to send you the report. She stated that you can give it to her for her back pain. Went for 5 visits but still don't get much relief for the pain.

## 2017-12-19 ENCOUNTER — Telehealth: Payer: Self-pay | Admitting: *Deleted

## 2017-12-19 NOTE — Telephone Encounter (Signed)
Sarah Miranda with [facility] called and stated that they received the Rx for Oxycodone Capsules but patient's insurance will only pay for tablets. Pharmacy is wanting to change this over to tablets. Please Advise.

## 2017-12-19 NOTE — Telephone Encounter (Signed)
Changed to Tablets. [place] with [facility] notified.

## 2017-12-19 NOTE — Telephone Encounter (Signed)
Tablets are fine.  Please update in epic.

## 2018-01-05 ENCOUNTER — Other Ambulatory Visit: Payer: Self-pay | Admitting: Internal Medicine

## 2018-01-06 ENCOUNTER — Other Ambulatory Visit: Payer: Self-pay | Admitting: Internal Medicine

## 2018-01-06 NOTE — Telephone Encounter (Signed)
A medication refill was received from pharmacy for lyrica 75 mg.  Rx was pended to provider for approval  after verifying last fill date, provider, and quantity on PMP [place] database

## 2018-01-12 ENCOUNTER — Other Ambulatory Visit: Payer: Self-pay | Admitting: Internal Medicine

## 2018-01-13 ENCOUNTER — Other Ambulatory Visit: Payer: Self-pay

## 2018-01-13 ENCOUNTER — Other Ambulatory Visit: Payer: Self-pay | Admitting: Internal Medicine

## 2018-01-13 ENCOUNTER — Other Ambulatory Visit: Payer: Medicare Other

## 2018-01-13 ENCOUNTER — Ambulatory Visit: Payer: Medicare Other

## 2018-01-13 DIAGNOSIS — F1120 Opioid dependence, uncomplicated: Secondary | ICD-10-CM

## 2018-01-13 DIAGNOSIS — Z029 Encounter for administrative examinations, unspecified: Secondary | ICD-10-CM

## 2018-01-16 ENCOUNTER — Ambulatory Visit: Payer: Medicare Other | Admitting: Internal Medicine

## 2018-01-17 ENCOUNTER — Telehealth: Payer: Self-pay | Admitting: *Deleted

## 2018-01-17 NOTE — Telephone Encounter (Signed)
Per Dr. Renato Gails no refills until patient is seen.

## 2018-01-17 NOTE — Telephone Encounter (Signed)
Patient called requesting refill on pain medication, Oxycodone. Patient has No Showed and Canceled several appointments. Is this ok to Refill. Please Advise.   NCCSRS Database Verified LR: 12/19/2017 Pharmacy Confirmed  Rx not Pended.

## 2018-01-17 NOTE — Telephone Encounter (Signed)
Stated that she could not remember her appointments due to her pain.   Scheduled an appointment with Dr. Renato Gails for Thursday at 10:00. Had patient repeat date back to me.

## 2018-01-19 ENCOUNTER — Ambulatory Visit (INDEPENDENT_AMBULATORY_CARE_PROVIDER_SITE_OTHER): Payer: Medicare Other | Admitting: Internal Medicine

## 2018-01-19 ENCOUNTER — Other Ambulatory Visit: Payer: Self-pay | Admitting: Internal Medicine

## 2018-01-19 ENCOUNTER — Other Ambulatory Visit: Payer: Self-pay

## 2018-01-19 ENCOUNTER — Ambulatory Visit (INDEPENDENT_AMBULATORY_CARE_PROVIDER_SITE_OTHER): Payer: Medicare Other

## 2018-01-19 ENCOUNTER — Encounter: Payer: Self-pay | Admitting: Internal Medicine

## 2018-01-19 VITALS — BP 160/78 | HR 115 | Temp 98.0°F | Ht 64.0 in | Wt 158.0 lb

## 2018-01-19 VITALS — BP 120/78 | HR 115 | Temp 98.0°F | Ht 64.0 in | Wt 158.0 lb

## 2018-01-19 DIAGNOSIS — E039 Hypothyroidism, unspecified: Secondary | ICD-10-CM

## 2018-01-19 DIAGNOSIS — Z13220 Encounter for screening for lipoid disorders: Secondary | ICD-10-CM

## 2018-01-19 DIAGNOSIS — Z0000 Encounter for general adult medical examination without abnormal findings: Secondary | ICD-10-CM | POA: Diagnosis not present

## 2018-01-19 DIAGNOSIS — F3175 Bipolar disorder, in partial remission, most recent episode depressed: Secondary | ICD-10-CM

## 2018-01-19 DIAGNOSIS — M797 Fibromyalgia: Secondary | ICD-10-CM

## 2018-01-19 DIAGNOSIS — Z6827 Body mass index (BMI) 27.0-27.9, adult: Secondary | ICD-10-CM

## 2018-01-19 DIAGNOSIS — Z79899 Other long term (current) drug therapy: Secondary | ICD-10-CM

## 2018-01-19 DIAGNOSIS — R739 Hyperglycemia, unspecified: Secondary | ICD-10-CM

## 2018-01-19 DIAGNOSIS — I10 Essential (primary) hypertension: Secondary | ICD-10-CM

## 2018-01-19 DIAGNOSIS — F1120 Opioid dependence, uncomplicated: Secondary | ICD-10-CM

## 2018-01-19 DIAGNOSIS — R231 Pallor: Secondary | ICD-10-CM

## 2018-01-19 DIAGNOSIS — D649 Anemia, unspecified: Secondary | ICD-10-CM

## 2018-01-19 DIAGNOSIS — E2839 Other primary ovarian failure: Secondary | ICD-10-CM

## 2018-01-19 LAB — COMPLETE METABOLIC PANEL WITH GFR
AG Ratio: 1.7 (calc) (ref 1.0–2.5)
ALT: 5 U/L — ABNORMAL LOW (ref 6–29)
AST: 12 U/L (ref 10–35)
Albumin: 4.1 g/dL (ref 3.6–5.1)
Alkaline phosphatase (APISO): 63 U/L (ref 33–130)
BUN: 13 mg/dL (ref 7–25)
CO2: 30 mmol/L (ref 20–32)
Calcium: 9.3 mg/dL (ref 8.6–10.4)
Chloride: 101 mmol/L (ref 98–110)
Creat: 0.93 mg/dL (ref 0.60–0.93)
GFR, Est African American: 71 mL/min/{1.73_m2} (ref >=60)
GFR, Est Non African American: 61 mL/min/{1.73_m2} (ref >=60)
Globulin: 2.4 g/dL (calc) (ref 1.9–3.7)
Glucose, Bld: 89 mg/dL (ref 65–139)
Potassium: 4 mmol/L (ref 3.5–5.3)
Sodium: 138 mmol/L (ref 135–146)
Total Bilirubin: 0.3 mg/dL (ref 0.2–1.2)
Total Protein: 6.5 g/dL (ref 6.1–8.1)

## 2018-01-19 LAB — CBC WITH DIFFERENTIAL/PLATELET
Basophils Absolute: 50 cells/uL (ref 0–200)
Basophils Relative: 0.6 %
Eosinophils Absolute: 252 cells/uL (ref 15–500)
Eosinophils Relative: 3 %
HCT: 37.7 % (ref 35.0–45.0)
Hemoglobin: 12.7 g/dL (ref 11.7–15.5)
Lymphs Abs: 2377 cells/uL (ref 850–3900)
MCH: 28.9 pg (ref 27.0–33.0)
MCHC: 33.7 g/dL (ref 32.0–36.0)
MCV: 85.7 fL (ref 80.0–100.0)
MPV: 9.3 fL (ref 7.5–12.5)
Monocytes Relative: 8.7 %
Neutro Abs: 4990 cells/uL (ref 1500–7800)
Neutrophils Relative %: 59.4 %
Platelets: 295 10*3/uL (ref 140–400)
RBC: 4.4 10*6/uL (ref 3.80–5.10)
RDW: 12.9 % (ref 11.0–15.0)
Total Lymphocyte: 28.3 %
WBC mixed population: 731 cells/uL (ref 200–950)
WBC: 8.4 10*3/uL (ref 3.8–10.8)

## 2018-01-19 MED ORDER — ZOSTER VAC RECOMB ADJUVANTED 50 MCG/0.5ML IM SUSR: 0.5000 mL | Freq: Once | INTRAMUSCULAR | 1 refills | Status: AC

## 2018-01-19 MED ORDER — PREGABALIN 150 MG PO CAPS: 150.0000 mg | ORAL_CAPSULE | Freq: Every day | ORAL | 0 refills | Status: DC
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
## 2018-01-19 NOTE — Patient Instructions (Addendum)
Stop all oxycodone.  Remember, this is what we discussed was the goal when you opted to continue to see me for pain management rather than seeing a pain clinic.   Begin lyrica  instead of  daily. This will help your fibromyalgia pain.    Myofascial Pain Syndrome and Fibromyalgia Myofascial pain syndrome and fibromyalgia are both pain disorders. This pain may be felt mainly in your muscles.  Myofascial pain syndrome: ? Always has trigger points or tender points in the muscle that will cause pain when pressed. The pain may come and go. ? Usually affects your neck, upper back, and shoulder areas. The pain often radiates into your arms and hands.  Fibromyalgia: ? Has muscle pains and tenderness that come and go. ? Is often associated with fatigue and sleep disturbances. ? Has trigger points. ? Tends to be long-lasting (chronic), but is not life-threatening.  Fibromyalgia and myofascial pain are not the same. However, they often occur together. If you have both conditions, each can make the other worse. Both are common and can cause enough pain and fatigue to make day-to-day activities difficult. What are the causes? The exact causes of fibromyalgia and myofascial pain are not known. People with certain gene types may be more likely to develop fibromyalgia. Some factors can be triggers for both conditions, such as:  Spine disorders.  Arthritis.  Severe injury (trauma) and other physical stressors.  Being under a lot of stress.  A medical illness.  What are the signs or symptoms? Fibromyalgia The main symptom of fibromyalgia is widespread pain and tenderness in your muscles. This can vary over time. Pain is sometimes described as stabbing, shooting, or burning. You may have tingling or numbness, too. You may also have sleep problems and fatigue. You may wake up feeling tired and groggy (fibro fog). Other symptoms may include:  Bowel and bladder problems.  Headaches.  Visual  problems.  Problems with odors and noises.  Depression or mood changes.  Painful menstrual periods (dysmenorrhea).  Dry skin or eyes.  Myofascial pain syndrome Symptoms of myofascial pain syndrome include:  Tight, ropy bands of muscle.  Uncomfortable sensations in muscular areas, such as: ? Aching. ? Cramping. ? Burning. ? Numbness. ? Tingling. ? Muscle weakness.  Trouble moving certain muscles freely (range of motion).  How is this diagnosed? There are no specific tests to diagnose fibromyalgia or myofascial pain syndrome. Both can be hard to diagnose because their symptoms are common in many other conditions. Your health care provider may suspect one or both of these conditions based on your symptoms and medical history. Your health care provider will also do a physical exam. The key to diagnosing fibromyalgia is having pain, fatigue, and other symptoms for more than three months that cannot be explained by another condition. The key to diagnosing myofascial pain syndrome is finding trigger points in muscles that are tender and cause pain elsewhere in your body (referred pain). How is this treated? Treating fibromyalgia and myofascial pain often requires a team of health care providers. This usually starts with your primary provider and a physical therapist. You may also find it helpful to work with alternative health care providers, such as massage therapists or acupuncturists. Treatment for fibromyalgia may include medicines. This may include nonsteroidal anti-inflammatory drugs (NSAIDs), along with other medicines. Treatment for myofascial pain may also include:  NSAIDs.  Cooling and stretching of muscles.  Trigger point injections.  Sound wave (ultrasound) treatments to stimulate muscles.  Follow these instructions  at home:  Take medicines only as directed by your health care provider.  Exercise as directed by your health care provider or physical therapist.  Try  to avoid stressful situations.  Practice relaxation techniques to control your stress. You may want to try: ? Biofeedback. ? Visual imagery. ? Hypnosis. ? Muscle relaxation. ? Yoga. ? Meditation.  Talk to your health care provider about alternative treatments, such as acupuncture or massage treatment.  Maintain a healthy lifestyle. This includes eating a healthy diet and getting enough sleep.  Consider joining a support group.  Do not do activities that stress or strain your muscles. That includes repetitive motions and heavy lifting. Where to find more information:  National Fibromyalgia Association: www.fmaware.org  Arthritis Foundation: www.arthritis.org  American Chronic Pain Association: GumSearch.nl Contact a health care provider if:  You have new symptoms.  Your symptoms get worse.  You have side effects from your medicines.  You have trouble sleeping.  Your condition is causing depression or anxiety. This information is not intended to replace advice given to you by your health care provider. Make sure you discuss any questions you have with your health care provider. Document Released: 08/23/2005 Document Revised: 01/29/2016 Document Reviewed: 05/29/2014 Elsevier Interactive Patient Education  2018 [employer].   Fat and Cholesterol Restricted Diet Getting too much fat and cholesterol in your diet may cause health problems. Following this diet helps keep your fat and cholesterol at normal levels. This can keep you from getting sick. What types of fat should I choose?  Choose monosaturated and polyunsaturated fats. These are found in foods such as olive oil, canola oil, flaxseeds, walnuts, almonds, and seeds.  Eat more omega-3 fats. Good choices include salmon, mackerel, sardines, tuna, flaxseed oil, and ground flaxseeds.  Limit saturated fats. These are in animal products such as meats, butter, and cream. They can also be in  plant products such as palm oil, palm kernel oil, and coconut oil.  Avoid foods with partially hydrogenated oils in them. These contain trans fats. Examples of foods that have trans fats are stick margarine, some tub margarines, cookies, crackers, and other baked goods. What general guidelines do I need to follow?  Check food labels. Look for the words "trans fat" and "saturated fat."  When preparing a meal: ? Fill half of your plate with vegetables and green salads. ? Fill one fourth of your plate with whole grains. Look for the word "whole" as the first word in the ingredient list. ? Fill one fourth of your plate with lean protein foods.  Eat more foods that have fiber, like apples, carrots, beans, peas, and barley.  Eat more home-cooked foods. Eat less at restaurants and buffets.  Limit or avoid alcohol.  Limit foods high in starch and sugar.  Limit fried foods.  Cook foods without frying them. Baking, boiling, grilling, and broiling are all great options.  Lose weight if you are overweight. Losing even a small amount of weight can help your overall health. It can also help prevent diseases such as diabetes and heart disease. What foods can I eat? Grains Whole grains, such as whole wheat or whole grain breads, crackers, cereals, and pasta. Unsweetened oatmeal, bulgur, barley, quinoa, or brown rice. Corn or whole wheat flour tortillas. Vegetables Fresh or frozen vegetables (raw, steamed, roasted, or grilled). Green salads. Fruits All fresh, canned (in natural juice), or frozen fruits. Meat and Other Protein Products Ground beef (85% or leaner), grass-fed beef, or beef trimmed of fat. Skinless  chicken or [place]. Ground chicken or [place]. Pork trimmed of fat. All fish and seafood. Eggs. Dried beans, peas, or lentils. Unsalted nuts or seeds. Unsalted canned or dry beans. Dairy Low-fat dairy products, such as skim or 1% milk, 2% or reduced-fat cheeses, low-fat ricotta or cottage  cheese, or plain low-fat yogurt. Fats and Oils Tub margarines without trans fats. Light or reduced-fat mayonnaise and salad dressings. Avocado. Olive, canola, sesame, or safflower oils. Natural peanut or almond butter (choose ones without added sugar and oil). The items listed above may not be a complete list of recommended foods or beverages. Contact your dietitian for more options. What foods are not recommended? Grains White bread. White pasta. White rice. Cornbread. Bagels, pastries, and croissants. Crackers that contain trans fat. Vegetables White potatoes. Corn. Creamed or fried vegetables. Vegetables in a cheese sauce. Fruits Dried fruits. Canned fruit in light or heavy syrup. Fruit juice. Meat and Other Protein Products Fatty cuts of meat. Ribs, chicken wings, bacon, sausage, bologna, salami, chitterlings, fatback, hot dogs, bratwurst, and packaged luncheon meats. Liver and organ meats. Dairy Whole or 2% milk, cream, half-and-half, and cream cheese. Whole milk cheeses. Whole-fat or sweetened yogurt. Full-fat cheeses. Nondairy creamers and whipped toppings. Processed cheese, cheese spreads, or cheese curds. Sweets and Desserts Corn syrup, sugars, honey, and molasses. Candy. Jam and jelly. Syrup. Sweetened cereals. Cookies, pies, cakes, donuts, muffins, and ice cream. Fats and Oils Butter, stick margarine, lard, shortening, ghee, or bacon fat. Coconut, palm kernel, or palm oils. Beverages Alcohol. Sweetened drinks (such as sodas, lemonade, and fruit drinks or punches). The items listed above may not be a complete list of foods and beverages to avoid. Contact your dietitian for more information. This information is not intended to replace advice given to you by your health care provider. Make sure you discuss any questions you have with your health care provider. Document Released: 02/22/2012 Document Revised: 04/29/2016 Document Reviewed: 11/22/2013 Elsevier Interactive Patient  Education  [employer].

## 2018-01-19 NOTE — Progress Notes (Signed)
Subjective:   Sarah Miranda is a 72 y.o. female who presents for an Initial Medicare Annual Wellness Visit.       Objective:    Today's Vitals   01/19/18 0929  BP: 120/78  Pulse: (!) 115  Temp: 98 F (36.7 C)  TempSrc: Oral  SpO2: 94%  Weight: 158 lb (71.7 kg)  Height:  (1.626 m)  PainSc: 8    Body mass index is 27.12 kg/m.  Advanced Directives 01/19/2018 05/30/2017 05/20/2017 05/19/2017 12/02/2016 06/23/2016  Does Patient Have a Medical Advance Directive? Yes Yes Yes Yes Yes No  Type of Sales promotion account executive of [employer] Power of [employer] Power of Attorney Healthcare Power of [place] -  Does patient want to make changes to medical advance directive? No - Patient declined - No - Patient declined - No - Patient declined -  Copy of Healthcare Power of Attorney in Chart? Yes Yes No - copy requested Yes Yes -    Current Medications (verified) Outpatient Encounter Medications as of 01/19/2018  Medication Sig  . amitriptyline (ELAVIL) 25 MG tablet Take 1 tablet (25 mg total) by mouth at bedtime.  Marland Kitchen esomeprazole (NEXIUM 24HR) 20 MG capsule Take 20 mg by mouth daily as needed (for acid reflex).  Marland Kitchen FLUoxetine (PROZAC) 40 MG capsule TAKE 1 CAPSULE BY MOUTH DAILY TO HELP NERVES AND DEPRESSION  . levothyroxine (SYNTHROID, LEVOTHROID) 25 MCG tablet TAKE 1 TABLET BY MOUTH EVERY DAY  . LYRICA 75 MG capsule TAKE 1 CAPSULE BY MOUTH EVERY DAY  . Multiple Vitamin (MULTIVITAMIN WITH MINERALS) TABS tablet Take 1 tablet by mouth daily.  . ondansetron (ZOFRAN) 4 MG tablet TAKE 1 TABLET BY MOUTH EVERY 8 HOURS AS NEEDED FOR NAUSEA  . oxyCODONE (OXY IR/ROXICODONE) 5 MG immediate release tablet Take 5 mg by mouth every 8 (eight) hours as needed for severe pain.  Marland Kitchen risperiDONE (RISPERDAL) 1 MG tablet TAKE 1 TABLET BY MOUTH TWICE DAILY TO HELP RELAX  . senna (SENOKOT) 8.6 MG TABS tablet Take 1 tablet (8.6 mg total) by mouth daily as  needed for mild constipation.  . Zoster Vaccine Adjuvanted [facility]) injection Inject 0.5 mLs into the muscle once for 1 dose.  . [DISCONTINUED] Zoster Vaccine Adjuvanted [facility]) injection Inject 0.5 mLs into the muscle once.  . [DISCONTINUED] ferrous sulfate 325 (65 FE) MG tablet Take 1 tablet (325 mg total) by mouth 3 (three) times daily with meals.   No facility-administered encounter medications on file as of 01/19/2018.     Allergies (verified) Fetzima [levomilnacipran]   History: Past Medical History:  Diagnosis Date  . Abnormal involuntary movements(781.0)   . Alopecia, unspecified   . Anemia, unspecified   . Anxiety   . Bipolar disorder, unspecified (HCC)   . Depression   . Dysuria   . Edema   . Encounter for long-term (current) use of other medications   . Esophageal reflux   . Fibromyalgia   . Hyperthyroidism   . Insomnia   . Lumbago   . Nonspecific elevation of levels of transaminase or lactic acid dehydrogenase (LDH)   . Other abnormal blood chemistry    hyperglycemia  . Other malaise and fatigue   . Pathologic fracture of vertebrae   . Reflux esophagitis   . Syncope and collapse   . Tachycardia, unspecified   . Unspecified essential hypertension   . Unspecified hypothyroidism    Past Surgical History:  Procedure Laterality Date  . CHOLECYSTECTOMY  10/2010   complicated by bile leak  . ERCP  11/03/2010   for bile leak  . LAPAROSCOPIC TUBAL LIGATION    . TONSILLECTOMY     Family History  Problem Relation Age of Onset  . Cancer Father        prostate  . Colon cancer Neg Hx    Social History   Socioeconomic History  . Marital status: Married    Spouse name: Not on file  . Number of children: 2  . Years of education: Not on file  . Highest education level: Not on file  Occupational History  . Occupation: retired  Engineer, production  . Financial resource strain: Not hard at all  . Food insecurity:    Worry: Never true    Inability: Never true  .  Transportation needs:    Medical: No    Non-medical: No  Tobacco Use  . Smoking status: Never Smoker  . Smokeless tobacco: Never Used  Substance and Sexual Activity  . Alcohol use: No    Alcohol/week: 0.0 oz  . Drug use: No  . Sexual activity: Never  Lifestyle  . Physical activity:    Days per week: 0 days    Minutes per session: 0 min  . Stress: To some extent  Relationships  . Social connections:    Talks on phone: Three times a week    Gets together: Once a week    Attends religious service: More than 4 times per year    Active member of club or organization: No    Attends meetings of clubs or organizations: Never    Relationship status: Married  Other Topics Concern  . Not on file  Social History Narrative  . Not on file    Tobacco Counseling Counseling given: Not Answered   Clinical Intake:  Pre-visit preparation completed: No  Pain : 0-10 Pain Score: 8  Pain Type: Chronic pain Pain Location: Back Pain Orientation: Lower Pain Descriptors / Indicators: Aching Pain Onset: More than a month ago Pain Frequency: Intermittent     Nutritional Risks: None Diabetes: No  How often do you need to have someone help you when you read instructions, pamphlets, or other written materials from your doctor or pharmacy?: 1 - Never What is the last grade level you completed in school?: HS  Interpreter Needed?: No  Information entered by :: [name], RN   Activities of Daily Living In your present state of health, do you have any difficulty performing the following activities: 01/19/2018 05/20/2017  Hearing? N N  Vision? N N  Difficulty concentrating or making decisions? Y N  Walking or climbing stairs? Y Y  Dressing or bathing? N Y  Doing errands, shopping? N Y  Quarry manager and eating ? N -  Using the Toilet? N -  In the past six months, have you accidently leaked urine? N -  Do you have problems with loss of bowel control? N -  Managing your  Medications? N -  Managing your Finances? Y -  Housekeeping or managing your Housekeeping? N -  Some recent data might be hidden     Immunizations and Health Maintenance Immunization History  Administered Date(s) Administered  . Influenza Whole 05/20/2013  . Influenza, High Dose Seasonal PF 05/30/2017  . Influenza,inj,Quad PF,6+ Mos 05/29/2014, 05/13/2015  . Influenza-Unspecified 06/03/2016  . Pneumococcal Conjugate-13 09/05/2015  . Pneumococcal Polysaccharide-23 10/13/2011  . Td 10/13/2011  . Zoster 10/20/2011   Health Maintenance Due  Topic Date  Due  . Hepatitis C Screening  1946-05-09  . COLONOSCOPY  08/01/1995  . DEXA SCAN  07/31/2010    Patient Care Team: Kermit Balo, DO as PCP - General (Geriatric Medicine)  Indicate any recent Medical Services you may have received from other than [facility] providers in the past year (date may be approximate).     Assessment:   This is a routine wellness examination for Sarah Miranda.  Hearing/Vision screen No exam data present  Dietary issues and exercise activities discussed: Current Exercise Habits: The patient does not participate in regular exercise at present, Exercise limited by: None identified  Goals    None     Depression Screen PHQ 2/9 Scores 01/19/2018 09/15/2017 05/19/2017 01/07/2015 05/29/2014 07/18/2013 03/14/2013  PHQ - 2 Score 0 0 0 0 0 0 0    Fall Risk Fall Risk  01/19/2018 09/15/2017 05/19/2017 11/11/2015 05/13/2015  Falls in the past year? Yes No No No No  Number falls in past yr: 1 - - - -  Injury with Fall? No - - - -    Is the patient's home free of loose throw rugs in walkways, pet beds, electrical cords, etc?   yes      Grab bars in the bathroom? no      Handrails on the stairs?   yes      Adequate lighting?   yes  Timed Get Up and Go Performed: 19 seconds, fall risk  Cognitive Function: MMSE - Mini Mental State Exam 01/19/2018  Orientation to time 5  Orientation to Place 5  Registration 3  Attention/  Calculation 5  Recall 1  Language- name 2 objects 2  Language- repeat 1  Language- follow 3 step command 3  Language- read & follow direction 1  Write a sentence 1  Copy design 1  Total score 28        Screening Tests Health Maintenance  Topic Date Due  . Hepatitis C Screening  18-Mar-1946  . COLONOSCOPY  08/01/1995  . DEXA SCAN  07/31/2010  . INFLUENZA VACCINE  04/06/2018  . COLON CANCER SCREENING ANNUAL FOBT  05/20/2018  . MAMMOGRAM  06/28/2019  . TETANUS/TDAP  10/12/2021  . PNA vac Low Risk Adult  Completed    Qualifies for Shingles Vaccine? Yes, educated and prescription sent to pharmacy  Cancer Screenings: Lung: Low Dose CT Chest recommended if Age 64-80 years, 30 pack-year currently smoking OR have quit w/in 15years. Patient does not qualify. Breast: Up to date on Mammogram? Yes   Up to date of Bone Density/Dexa? No, ordered Colorectal: up to date  Additional Screenings:  Hepatitis C Screening: declined     Plan:    I have personally reviewed and addressed the Medicare Annual Wellness questionnaire and have noted the following in the patient's chart:  A. Medical and social history B. Use of alcohol, tobacco or illicit drugs  C. Current medications and supplements D. Functional ability and status E.  Nutritional status F.  Physical activity G. Advance directives H. List of other physicians I.  Hospitalizations, surgeries, and ER visits in previous 12 months J.  Vitals K. Screenings to include hearing, vision, cognitive, depression L. Referrals and appointments - none  In addition, I have reviewed and discussed with patient certain preventive protocols, quality metrics, and best practice recommendations. A written personalized care plan for preventive services as well as general preventive health recommendations were provided to patient.  See attached scanned questionnaire for additional information.   Signed,  [name], RN Nurse Health  Advisor  Patient Concerns: Back pain severe

## 2018-01-19 NOTE — Patient Instructions (Addendum)
Ms. Sarah Miranda , Thank you for taking time to come for your Medicare Wellness Visit. I appreciate your ongoing commitment to your health goals. Please review the following plan we discussed and let me know if I can assist you in the future.   Screening recommendations/referrals: Colonoscopy/cologuard up to date due 05/20/2018 Mammogram up to date, due 06/27/2018 Bone Density due-ordered Recommended yearly ophthalmology/optometry visit for glaucoma screening and checkup Recommended yearly dental visit for hygiene and checkup  Vaccinations: Influenza vaccine up to date, due 2019 fall season Pneumococcal vaccine up to date, completed Tdap vaccine up to date, due 10/12/2021 Shingles vaccine due-ordered    Advanced directives: In chart  Conditions/risks identified: none  Next appointment: Sarah Russell, RN 01/22/2019 @ 1:30pm   Preventive Care 65 Years and Older, Female Preventive care refers to lifestyle choices and visits with your health care provider that can promote health and wellness. What does preventive care include?  A yearly physical exam. This is also called an annual well check.  Dental exams once or twice a year.  Routine eye exams. Ask your health care provider how often you should have your eyes checked.  Personal lifestyle choices, including:  Daily care of your teeth and gums.  Regular physical activity.  Eating a healthy diet.  Avoiding tobacco and drug use.  Limiting alcohol use.  Practicing safe sex.  Taking low-dose aspirin every day.  Taking vitamin and mineral supplements as recommended by your health care provider. What happens during an annual well check? The services and screenings done by your health care provider during your annual well check will depend on your age, overall health, lifestyle risk factors, and family history of disease. Counseling  Your health care provider may ask you questions about your:  Alcohol use.  Tobacco use.  Drug  use.  Emotional well-being.  Home and relationship well-being.  Sexual activity.  Eating habits.  History of falls.  Memory and ability to understand (cognition).  Work and work Astronomer.  Reproductive health. Screening  You may have the following tests or measurements:  Height, weight, and BMI.  Blood pressure.  Lipid and cholesterol levels. These may be checked every 5 years, or more frequently if you are over 10 years old.  Skin check.  Lung cancer screening. You may have this screening every year starting at age 68 if you have a 30-pack-year history of smoking and currently smoke or have quit within the past 15 years.  Fecal occult blood test (FOBT) of the stool. You may have this test every year starting at age 56.  Flexible sigmoidoscopy or colonoscopy. You may have a sigmoidoscopy every 5 years or a colonoscopy every 10 years starting at age 55.  Hepatitis C blood test.  Hepatitis B blood test.  Sexually transmitted disease (STD) testing.  Diabetes screening. This is done by checking your blood sugar (glucose) after you have not eaten for a while (fasting). You may have this done every 1-3 years.  Bone density scan. This is done to screen for osteoporosis. You may have this done starting at age 7.  Mammogram. This may be done every 1-2 years. Talk to your health care provider about how often you should have regular mammograms. Talk with your health care provider about your test results, treatment options, and if necessary, the need for more tests. Vaccines  Your health care provider may recommend certain vaccines, such as:  Influenza vaccine. This is recommended every year.  Tetanus, diphtheria, and acellular pertussis (Tdap, Td)  vaccine. You may need a Td booster every 10 years.  Zoster vaccine. You may need this after age 35.  Pneumococcal 13-valent conjugate (PCV13) vaccine. One dose is recommended after age 32.  Pneumococcal polysaccharide  (PPSV23) vaccine. One dose is recommended after age 60. Talk to your health care provider about which screenings and vaccines you need and how often you need them. This information is not intended to replace advice given to you by your health care provider. Make sure you discuss any questions you have with your health care provider. Document Released: 09/19/2015 Document Revised: 05/12/2016 Document Reviewed: 06/24/2015 Elsevier Interactive Patient Education  2017 [employer] Prevention in the Home Falls can cause injuries. They can happen to people of all ages. There are many things you can do to make your home safe and to help prevent falls. What can I do on the outside of my home?  Regularly fix the edges of walkways and driveways and fix any cracks.  Remove anything that might make you trip as you walk through a door, such as a raised step or threshold.  Trim any bushes or trees on the path to your home.  Use bright outdoor lighting.  Clear any walking paths of anything that might make someone trip, such as rocks or tools.  Regularly check to see if handrails are loose or broken. Make sure that both sides of any steps have handrails.  Any raised decks and porches should have guardrails on the edges.  Have any leaves, snow, or ice cleared regularly.  Use sand or salt on walking paths during winter.  Clean up any spills in your garage right away. This includes oil or grease spills. What can I do in the bathroom?  Use night lights.  Install grab bars by the toilet and in the tub and shower. Do not use towel bars as grab bars.  Use non-skid mats or decals in the tub or shower.  If you need to sit down in the shower, use a plastic, non-slip stool.  Keep the floor dry. Clean up any water that spills on the floor as soon as it happens.  Remove soap buildup in the tub or shower regularly.  Attach bath mats securely with double-sided non-slip rug tape.  Do not have  throw rugs and other things on the floor that can make you trip. What can I do in the bedroom?  Use night lights.  Make sure that you have a light by your bed that is easy to reach.  Do not use any sheets or blankets that are too big for your bed. They should not hang down onto the floor.  Have a firm chair that has side arms. You can use this for support while you get dressed.  Do not have throw rugs and other things on the floor that can make you trip. What can I do in the kitchen?  Clean up any spills right away.  Avoid walking on wet floors.  Keep items that you use a lot in easy-to-reach places.  If you need to reach something above you, use a strong step stool that has a grab bar.  Keep electrical cords out of the way.  Do not use floor polish or wax that makes floors slippery. If you must use wax, use non-skid floor wax.  Do not have throw rugs and other things on the floor that can make you trip. What can I do with my stairs?  Do not leave  any items on the stairs.  Make sure that there are handrails on both sides of the stairs and use them. Fix handrails that are broken or loose. Make sure that handrails are as long as the stairways.  Check any carpeting to make sure that it is firmly attached to the stairs. Fix any carpet that is loose or worn.  Avoid having throw rugs at the top or bottom of the stairs. If you do have throw rugs, attach them to the floor with carpet tape.  Make sure that you have a light switch at the top of the stairs and the bottom of the stairs. If you do not have them, ask someone to add them for you. What else can I do to help prevent falls?  Wear shoes that:  Do not have high heels.  Have rubber bottoms.  Are comfortable and fit you well.  Are closed at the toe. Do not wear sandals.  If you use a stepladder:  Make sure that it is fully opened. Do not climb a closed stepladder.  Make sure that both sides of the stepladder are  locked into place.  Ask someone to hold it for you, if possible.  Clearly mark and make sure that you can see:  Any grab bars or handrails.  First and last steps.  Where the edge of each step is.  Use tools that help you move around (mobility aids) if they are needed. These include:  Canes.  Walkers.  Scooters.  Crutches.  Turn on the lights when you go into a dark area. Replace any light bulbs as soon as they burn out.  Set up your furniture so you have a clear path. Avoid moving your furniture around.  If any of your floors are uneven, fix them.  If there are any pets around you, be aware of where they are.  Review your medicines with your doctor. Some medicines can make you feel dizzy. This can increase your chance of falling. Ask your doctor what other things that you can do to help prevent falls. This information is not intended to replace advice given to you by your health care provider. Make sure you discuss any questions you have with your health care provider. Document Released: 06/19/2009 Document Revised: 01/29/2016 Document Reviewed: 09/27/2014 Elsevier Interactive Patient Education  2017 [employer].

## 2018-01-20 ENCOUNTER — Ambulatory Visit: Payer: Self-pay

## 2018-01-22 LAB — PAIN MGMT, PROFILE 6 W/CONF, U
6 Acetylmorphine: NEGATIVE ng/mL (ref <10)
Alcohol Metabolites: NEGATIVE ng/mL (ref <500)
Amphetamines: NEGATIVE ng/mL (ref <500)
Barbiturates: NEGATIVE ng/mL (ref <300)
Benzodiazepines: NEGATIVE ng/mL (ref <100)
Cocaine Metabolite: NEGATIVE ng/mL (ref <150)
Codeine: NEGATIVE ng/mL (ref <50)
Creatinine: 94.2 mg/dL
Hydrocodone: NEGATIVE ng/mL (ref <50)
Hydromorphone: NEGATIVE ng/mL (ref <50)
Marijuana Metabolite: NEGATIVE ng/mL (ref <20)
Methadone Metabolite: NEGATIVE ng/mL (ref <100)
Morphine: NEGATIVE ng/mL (ref <50)
Norhydrocodone: NEGATIVE ng/mL (ref <50)
Noroxycodone: 10336 ng/mL — ABNORMAL HIGH (ref <50)
Opiates: NEGATIVE ng/mL (ref <100)
Oxidant: NEGATIVE ug/mL (ref <200)
Oxycodone: 2223 ng/mL — ABNORMAL HIGH (ref <50)
Oxycodone: POSITIVE ng/mL — AB (ref <100)
Oxymorphone: 217 ng/mL — ABNORMAL HIGH (ref <50)
Phencyclidine: NEGATIVE ng/mL (ref <25)
pH: 7.05 (ref 4.5–9.0)

## 2018-01-23 ENCOUNTER — Encounter: Payer: Self-pay | Admitting: *Deleted

## 2018-01-30 ENCOUNTER — Other Ambulatory Visit: Payer: Self-pay | Admitting: Internal Medicine

## 2018-01-30 DIAGNOSIS — M797 Fibromyalgia: Secondary | ICD-10-CM

## 2018-01-31 ENCOUNTER — Telehealth: Payer: Self-pay | Admitting: *Deleted

## 2018-01-31 DIAGNOSIS — F1120 Opioid dependence, uncomplicated: Secondary | ICD-10-CM

## 2018-01-31 DIAGNOSIS — M797 Fibromyalgia: Secondary | ICD-10-CM

## 2018-01-31 NOTE — Telephone Encounter (Signed)
Patient called and stated that she is really hurting with a bad Headache. Stated that she took 3 excedrin with no relief. Patient stated she wants the Oxycodone back. Medication advised is not working.  Please Advise.

## 2018-01-31 NOTE — Telephone Encounter (Signed)
Spoke with patient and she's not having headaches, she just needs something for pain. Per pt the Excedrin is not helping and needing something for the pain that come with fibromyalgia . Please advise

## 2018-01-31 NOTE — Telephone Encounter (Signed)
She should be seen to address her headache.  Oxycodone is not for headaches.

## 2018-02-01 NOTE — Telephone Encounter (Signed)
We are treating her appropriately with amitriptyline and lyrica.  As we've discussed, if she is in need of additional pain medication, I am happy to refer her for pain management.

## 2018-02-01 NOTE — Telephone Encounter (Signed)
Referral completed and signed.

## 2018-02-01 NOTE — Telephone Encounter (Signed)
Patient's husband called and left message stating his wife would like to consider pain management referral to Dr.Plummer with [facility]   Referral pending, please associate with the appropriate diagnosis

## 2018-02-03 DIAGNOSIS — I10 Essential (primary) hypertension: Secondary | ICD-10-CM | POA: Diagnosis not present

## 2018-02-03 DIAGNOSIS — M549 Dorsalgia, unspecified: Secondary | ICD-10-CM | POA: Diagnosis not present

## 2018-02-03 DIAGNOSIS — R05 Cough: Secondary | ICD-10-CM | POA: Diagnosis not present

## 2018-02-03 DIAGNOSIS — M797 Fibromyalgia: Secondary | ICD-10-CM | POA: Diagnosis not present

## 2018-02-07 ENCOUNTER — Ambulatory Visit (INDEPENDENT_AMBULATORY_CARE_PROVIDER_SITE_OTHER): Payer: Medicare Other | Admitting: Nurse Practitioner

## 2018-02-07 ENCOUNTER — Encounter: Payer: Self-pay | Admitting: Nurse Practitioner

## 2018-02-07 ENCOUNTER — Telehealth: Payer: Self-pay

## 2018-02-07 VITALS — BP 146/84 | HR 86 | Temp 98.6°F | Ht 64.0 in | Wt 158.8 lb

## 2018-02-07 DIAGNOSIS — W19XXXA Unspecified fall, initial encounter: Secondary | ICD-10-CM

## 2018-02-07 DIAGNOSIS — R2689 Other abnormalities of gait and mobility: Secondary | ICD-10-CM

## 2018-02-07 DIAGNOSIS — M797 Fibromyalgia: Secondary | ICD-10-CM

## 2018-02-07 DIAGNOSIS — M545 Low back pain, unspecified: Secondary | ICD-10-CM

## 2018-02-07 NOTE — Patient Instructions (Addendum)
To continue meloxicam 7.5 mg by mouth daily To use heat to back 2-3 times daily with muscle rub AFTER heat ( examples biofreeze, benegay, aspercream with or without lidocaine)  Can also use tylenol 500 mg 2 tablet every 8 hours as needed for pain.  Home health orders have been given to help with gait and pain.

## 2018-02-07 NOTE — Telephone Encounter (Signed)
Noted.  This information was not shared with me at her appointments.  She cannot see two PCPs.  This, along with the continual demands for opioid therapy despite our agreement, are grounds for discharge.  I will discuss with our office manager [facility].

## 2018-02-07 NOTE — Telephone Encounter (Signed)
Patient was [facility]18 but pt could not tell me the instructions as to how she was taking the medication. I called [facility] to find out why the med was prescribed and was told that pt had called complaining of mid back pain, so Rx was phoned in for pt.   I then asked when and why patient had been seen by their office and was told that patient established care for new primary care provider on 12/12/17.

## 2018-02-07 NOTE — Progress Notes (Signed)
Careteam: Patient Care Team: Kermit Balo, DO as PCP - General (Geriatric Medicine)  Advanced Directive information Does Patient Have a Medical Advance Directive?: Yes, Type of Advance Directive: Healthcare Power of Attorney  Allergies  Allergen Reactions  . Fetzima [Levomilnacipran] Nausea Only    Chief Complaint  Patient presents with  . Acute Visit    Pt fell 4 days ago. She hit the back of her head and hurt her back. Pt reports extreme leg weakness day of and day after fall.      HPI: Patient is a 72 y.o. female seen in the office today due to multiple falls. Pt reports 3 falls in the last 2 weeks. Last fall was 5 days ago. Pt reports her leg will jump while she was walking and then she will go down. Her other 2 falls were different and was able to get back up and walk around without significant pain. Reports the other fall was because she tripped over the cat and being off balance.  She sat down in the floor (hit bottom) and fell backwards. Hit the back of her head when she went backward after the fall.  Did not black out. Reports her husband had to get her out of the floor. After that her leg continued to jerk for the day but resolved the next day and has not had a problem with it since. Reports left middle and lower back has felt tight since.  Has been taking meloxicam 7.5 mg twice (started on Friday) from another PCP- Rx given for meloxicam 7.5 mg daily  Alternating heat and ice Taking 2 extra strength excedrin as well Has also tried tylenol, advil in the past but there are not has effective- reports due to her fibromyalgia  Able to bear weight. Fearful due to her leg kicking out but it has not done so.  No loss of bowel or bladder.   Reports increase in lyrica has benefited her fibromyalgia    Review of Systems:  Review of Systems  Constitutional: Negative for chills, fever and malaise/fatigue.  Respiratory: Negative for shortness of breath.   Cardiovascular:  Negative for chest pain.  Gastrointestinal: Negative for constipation and diarrhea.  Genitourinary: Negative for dysuria and frequency.  Musculoskeletal: Positive for back pain, falls, joint pain and myalgias.  Neurological: Negative for weakness.    Past Medical History:  Diagnosis Date  . Abnormal involuntary movements(781.0)   . Alopecia, unspecified   . Anemia, unspecified   . Anxiety   . Bipolar disorder, unspecified (HCC)   . Depression   . Dysuria   . Edema   . Encounter for long-term (current) use of other medications   . Esophageal reflux   . Fibromyalgia   . Hyperthyroidism   . Insomnia   . Lumbago   . Nonspecific elevation of levels of transaminase or lactic acid dehydrogenase (LDH)   . Other abnormal blood chemistry    hyperglycemia  . Other malaise and fatigue   . Pathologic fracture of vertebrae   . Reflux esophagitis   . Syncope and collapse   . Tachycardia, unspecified   . Unspecified essential hypertension   . Unspecified hypothyroidism    Past Surgical History:  Procedure Laterality Date  . CHOLECYSTECTOMY  10/2010   complicated by bile leak  . ERCP  11/03/2010   for bile leak  . LAPAROSCOPIC TUBAL LIGATION    . TONSILLECTOMY     Social History:   reports that she has never smoked.  She has never used smokeless tobacco. She reports that she does not drink alcohol or use drugs.  Family History  Problem Relation Age of Onset  . Cancer Father        prostate  . Colon cancer Neg Hx     Medications: Patient's Medications  New Prescriptions   No medications on file  Previous Medications   AMITRIPTYLINE (ELAVIL) 25 MG TABLET    TAKE 1 TABLET(25 MG) BY MOUTH AT BEDTIME   ESOMEPRAZOLE (NEXIUM 24HR) 20 MG CAPSULE    Take 20 mg by mouth daily as needed (for acid reflex).   FLUOXETINE (PROZAC) 40 MG CAPSULE    TAKE 1 CAPSULE BY MOUTH DAILY TO HELP NERVES AND DEPRESSION   LEVOTHYROXINE (SYNTHROID, LEVOTHROID) 25 MCG TABLET    TAKE 1 TABLET BY MOUTH EVERY  DAY   MELOXICAM (MOBIC) 7.5 MG TABLET    Take 7.5 mg by mouth daily.   MULTIPLE VITAMIN (MULTIVITAMIN WITH MINERALS) TABS TABLET    Take 1 tablet by mouth daily.   ONDANSETRON (ZOFRAN) 4 MG TABLET    TAKE 1 TABLET BY MOUTH EVERY 8 HOURS AS NEEDED FOR NAUSEA   PREGABALIN (LYRICA) 150 MG CAPSULE    Take 1 capsule (150 mg total) by mouth daily.   RISPERIDONE (RISPERDAL) 1 MG TABLET    TAKE 1 TABLET BY MOUTH TWICE DAILY TO HELP RELAX   SENNA (SENOKOT) 8.6 MG TABS TABLET    Take 1 tablet (8.6 mg total) by mouth daily as needed for mild constipation.  Modified Medications   No medications on file  Discontinued Medications   No medications on file     Physical Exam:  Vitals:   02/07/18 1042  BP: (!) 146/84  Pulse: 86  Temp: 98.6 F (37 C)  TempSrc: Oral  SpO2: 96%  Weight: 158 lb 12.8 oz (72 kg)  Height: 5\' 4"  (1.626 m)   Body mass index is 27.26 kg/m.  Physical Exam  Constitutional: She is oriented to person, place, and time. She appears well-developed and well-nourished. No distress.  HENT:  Head: Normocephalic and atraumatic.  Cardiovascular: Normal rate, regular rhythm, normal heart sounds and intact distal pulses.  Pulmonary/Chest: Effort normal and breath sounds normal. No respiratory distress.  Abdominal: Bowel sounds are normal.  Musculoskeletal: Normal range of motion. She exhibits tenderness.       Lumbar back: She exhibits tenderness.       Back:       Arms: Neurological: She is alert and oriented to person, place, and time. She has normal strength. No cranial nerve deficit or sensory deficit. Gait abnormal. Coordination normal.  Slow gait  Skin: Skin is warm and dry. Capillary refill takes less than 2 seconds. There is pallor.  Psychiatric: She has a normal mood and affect.   Labs reviewed: Basic Metabolic Panel: Recent Labs    05/19/17 1245  05/20/17 2122 05/21/17 0413 09/15/17 1646 01/19/18 1005  NA 135   < >  --  137 135 138  K 4.7   < >  --  4.4 4.4  4.0  CL 100   < >  --  110 99 101  CO2 24   < >  --  22 30 30   GLUCOSE 110   < >  --  94 105* 89  BUN 8   < >  --  7 9 13   CREATININE 0.80   < >  --  0.78 0.76 0.93  CALCIUM 8.7   < >  --  8.2* 8.3* 9.3  TSH 1.89  --  0.917  --  3.06  --    < > = values in this interval not displayed.   Liver Function Tests: Recent Labs    05/20/17 0956 05/21/17 0413 09/15/17 1646 01/19/18 1005  AST 14* 14* 13 12  ALT 9* 9* 8 5*  ALKPHOS 38 32*  --   --   BILITOT 0.6 1.3* 0.3 0.3  PROT 5.5* 5.1* 5.5* 6.5  ALBUMIN 3.6 3.1*  --   --    No results for input(s): LIPASE, AMYLASE in the last 8760 hours. No results for input(s): AMMONIA in the last 8760 hours. CBC: Recent Labs    05/30/17 1258 09/15/17 1646 01/19/18 1005  WBC 7.4 5.9 8.4  NEUTROABS 4,714 3,098 4,990  HGB 9.9* 12.3 12.7  HCT 34.3* 36.3 37.7  MCV 78.3* 92.6 85.7  PLT 286 186 295   Lipid Panel: No results for input(s): CHOL, HDL, LDLCALC, TRIG, CHOLHDL, LDLDIRECT in the last 8760 hours. TSH: Recent Labs    05/19/17 1245 05/20/17 2122 09/15/17 1646  TSH 1.89 0.917 3.06   A1C: Lab Results  Component Value Date   HGBA1C 4.7 05/19/2017     Assessment/Plan 1. Imbalance Reports fall due to being off balance, pt with slow gait today - Ambulatory referral to Home Health for gait and strength training   2. Fall, initial encounter - Ambulatory referral to Home Health  3. Fibromyalgia Ongoing, reports improvement in pain with increase in lyrica to 150 mg by mouth daily  4. Acute left-sided low back pain without sciatica -to cont mobic 7.5 mg by mouth daily for pain -to use heat 20-30 mins TID with muscle rub AFTER heat -may use tylenol 500 mg 2 tablets every 8 hours as needed - Ambulatory referral to Home Health  Return precautions discussed  Janene HarveyJessica K. Biagio BorgEubanks, AGNP  [facility] [phone]

## 2018-02-10 DIAGNOSIS — M797 Fibromyalgia: Secondary | ICD-10-CM | POA: Diagnosis not present

## 2018-02-24 ENCOUNTER — Inpatient Hospital Stay (HOSPITAL_COMMUNITY)
Admission: EM | Admit: 2018-02-24 | Discharge: 2018-02-25 | DRG: 378 | Disposition: A | Discharge status: Home / Self Care | Payer: Medicare Other | Attending: Internal Medicine | Admitting: Internal Medicine

## 2018-02-24 ENCOUNTER — Other Ambulatory Visit: Payer: Self-pay

## 2018-02-24 ENCOUNTER — Emergency Department (HOSPITAL_COMMUNITY): Payer: Medicare Other

## 2018-02-24 ENCOUNTER — Encounter (HOSPITAL_COMMUNITY): Payer: Self-pay

## 2018-02-24 DIAGNOSIS — S22060A Wedge compression fracture of T7-T8 vertebra, initial encounter for closed fracture: Secondary | ICD-10-CM | POA: Diagnosis present

## 2018-02-24 DIAGNOSIS — K2100 Gastro-esophageal reflux disease with esophagitis, without bleeding: Secondary | ICD-10-CM

## 2018-02-24 DIAGNOSIS — R079 Chest pain, unspecified: Secondary | ICD-10-CM | POA: Diagnosis present

## 2018-02-24 DIAGNOSIS — M542 Cervicalgia: Secondary | ICD-10-CM | POA: Diagnosis not present

## 2018-02-24 DIAGNOSIS — K529 Noninfective gastroenteritis and colitis, unspecified: Secondary | ICD-10-CM | POA: Diagnosis present

## 2018-02-24 DIAGNOSIS — Z79890 Hormone replacement therapy (postmenopausal): Secondary | ICD-10-CM | POA: Diagnosis not present

## 2018-02-24 DIAGNOSIS — S0990XA Unspecified injury of head, initial encounter: Secondary | ICD-10-CM | POA: Diagnosis not present

## 2018-02-24 DIAGNOSIS — E039 Hypothyroidism, unspecified: Secondary | ICD-10-CM | POA: Diagnosis not present

## 2018-02-24 DIAGNOSIS — I10 Essential (primary) hypertension: Secondary | ICD-10-CM | POA: Diagnosis not present

## 2018-02-24 DIAGNOSIS — R1084 Generalized abdominal pain: Secondary | ICD-10-CM | POA: Diagnosis not present

## 2018-02-24 DIAGNOSIS — S199XXA Unspecified injury of neck, initial encounter: Secondary | ICD-10-CM | POA: Diagnosis not present

## 2018-02-24 DIAGNOSIS — S299XXA Unspecified injury of thorax, initial encounter: Secondary | ICD-10-CM | POA: Diagnosis not present

## 2018-02-24 DIAGNOSIS — R197 Diarrhea, unspecified: Secondary | ICD-10-CM | POA: Diagnosis not present

## 2018-02-24 DIAGNOSIS — K449 Diaphragmatic hernia without obstruction or gangrene: Secondary | ICD-10-CM | POA: Diagnosis present

## 2018-02-24 DIAGNOSIS — K228 Other specified diseases of esophagus: Secondary | ICD-10-CM | POA: Diagnosis not present

## 2018-02-24 DIAGNOSIS — K921 Melena: Secondary | ICD-10-CM | POA: Diagnosis not present

## 2018-02-24 DIAGNOSIS — K559 Vascular disorder of intestine, unspecified: Secondary | ICD-10-CM | POA: Diagnosis not present

## 2018-02-24 DIAGNOSIS — M797 Fibromyalgia: Secondary | ICD-10-CM | POA: Diagnosis not present

## 2018-02-24 DIAGNOSIS — Z791 Long term (current) use of non-steroidal anti-inflammatories (NSAID): Secondary | ICD-10-CM | POA: Diagnosis not present

## 2018-02-24 DIAGNOSIS — R001 Bradycardia, unspecified: Secondary | ICD-10-CM | POA: Diagnosis not present

## 2018-02-24 DIAGNOSIS — Z8042 Family history of malignant neoplasm of prostate: Secondary | ICD-10-CM | POA: Diagnosis not present

## 2018-02-24 DIAGNOSIS — W19XXXA Unspecified fall, initial encounter: Secondary | ICD-10-CM | POA: Diagnosis not present

## 2018-02-24 DIAGNOSIS — F319 Bipolar disorder, unspecified: Secondary | ICD-10-CM | POA: Diagnosis present

## 2018-02-24 DIAGNOSIS — F419 Anxiety disorder, unspecified: Secondary | ICD-10-CM | POA: Diagnosis present

## 2018-02-24 DIAGNOSIS — R911 Solitary pulmonary nodule: Secondary | ICD-10-CM | POA: Diagnosis present

## 2018-02-24 DIAGNOSIS — M109 Gout, unspecified: Secondary | ICD-10-CM | POA: Diagnosis not present

## 2018-02-24 DIAGNOSIS — R000 Tachycardia, unspecified: Secondary | ICD-10-CM | POA: Diagnosis not present

## 2018-02-24 DIAGNOSIS — D509 Iron deficiency anemia, unspecified: Secondary | ICD-10-CM

## 2018-02-24 DIAGNOSIS — K2270 Barrett's esophagus without dysplasia: Secondary | ICD-10-CM | POA: Diagnosis not present

## 2018-02-24 DIAGNOSIS — K3189 Other diseases of stomach and duodenum: Secondary | ICD-10-CM | POA: Diagnosis not present

## 2018-02-24 DIAGNOSIS — R296 Repeated falls: Secondary | ICD-10-CM | POA: Diagnosis present

## 2018-02-24 DIAGNOSIS — S22080A Wedge compression fracture of T11-T12 vertebra, initial encounter for closed fracture: Secondary | ICD-10-CM | POA: Diagnosis not present

## 2018-02-24 DIAGNOSIS — S22000A Wedge compression fracture of unspecified thoracic vertebra, initial encounter for closed fracture: Secondary | ICD-10-CM | POA: Diagnosis not present

## 2018-02-24 DIAGNOSIS — S32020A Wedge compression fracture of second lumbar vertebra, initial encounter for closed fracture: Secondary | ICD-10-CM | POA: Diagnosis not present

## 2018-02-24 DIAGNOSIS — K6389 Other specified diseases of intestine: Secondary | ICD-10-CM | POA: Diagnosis not present

## 2018-02-24 DIAGNOSIS — S3991XA Unspecified injury of abdomen, initial encounter: Secondary | ICD-10-CM | POA: Diagnosis not present

## 2018-02-24 DIAGNOSIS — R51 Headache: Secondary | ICD-10-CM | POA: Diagnosis not present

## 2018-02-24 DIAGNOSIS — I499 Cardiac arrhythmia, unspecified: Secondary | ICD-10-CM | POA: Diagnosis not present

## 2018-02-24 LAB — CBC
HCT: 36.8 % (ref 36.0–46.0)
HEMATOCRIT: 37.8 % (ref 36.0–46.0)
HEMOGLOBIN: 11.4 g/dL — AB (ref 12.0–15.0)
Hemoglobin: 11.2 g/dL — ABNORMAL LOW (ref 12.0–15.0)
MCH: 26.1 pg (ref 26.0–34.0)
MCH: 26.2 pg (ref 26.0–34.0)
MCHC: 30.2 g/dL (ref 30.0–36.0)
MCHC: 30.4 g/dL (ref 30.0–36.0)
MCV: 86.5 fL (ref 78.0–100.0)
MCV: 86 fL (ref 78.0–100.0)
PLATELETS: 262 10*3/uL (ref 150–400)
Platelets: 280 10*3/uL (ref 150–400)
RBC: 4.37 MIL/uL (ref 3.87–5.11)
RBC: 4.28 MIL/uL (ref 3.87–5.11)
RDW: 14.6 % (ref 11.5–15.5)
RDW: 14.6 % (ref 11.5–15.5)
WBC: 7.8 10*3/uL (ref 4.0–10.5)
WBC: 7 10*3/uL (ref 4.0–10.5)

## 2018-02-24 LAB — HEPATIC FUNCTION PANEL
ALT: 9 U/L — AB (ref 14–54)
AST: 15 U/L (ref 15–41)
Albumin: 3.4 g/dL — ABNORMAL LOW (ref 3.5–5.0)
Alkaline Phosphatase: 72 U/L (ref 38–126)
BILIRUBIN DIRECT: 0.1 mg/dL (ref 0.1–0.5)
Indirect Bilirubin: 0.5 mg/dL (ref 0.3–0.9)
Total Bilirubin: 0.6 mg/dL (ref 0.3–1.2)
Total Protein: 6.2 g/dL — ABNORMAL LOW (ref 6.5–8.1)

## 2018-02-24 LAB — BASIC METABOLIC PANEL
ANION GAP: 10 (ref 5–15)
BUN: 16 mg/dL (ref 6–20)
CO2: 26 mmol/L (ref 22–32)
Calcium: 8.9 mg/dL (ref 8.9–10.3)
Chloride: 100 mmol/L — ABNORMAL LOW (ref 101–111)
Creatinine, Ser: 1.01 mg/dL — ABNORMAL HIGH (ref 0.44–1.00)
GFR calc Af Amer: >60 mL/min (ref >60)
GFR, EST NON AFRICAN AMERICAN: 54 mL/min — AB (ref >60)
GLUCOSE: 97 mg/dL (ref 65–99)
POTASSIUM: 4.1 mmol/L (ref 3.5–5.1)
Sodium: 136 mmol/L (ref 135–145)

## 2018-02-24 LAB — I-STAT TROPONIN, ED: Troponin i, poc: 0.01 ng/mL (ref 0.00–0.08)

## 2018-02-24 LAB — POC OCCULT BLOOD, ED: Fecal Occult Bld: POSITIVE — AB

## 2018-02-24 LAB — SURGICAL PCR SCREEN
MRSA, PCR: NEGATIVE
STAPHYLOCOCCUS AUREUS: NEGATIVE

## 2018-02-24 LAB — TSH: TSH: 0.868 u[IU]/mL (ref 0.350–4.500)

## 2018-02-24 MED ORDER — SODIUM CHLORIDE 0.9 % IV BOLUS
500.0000 mL | Freq: Once | INTRAVENOUS | Status: AC
  Administered 2018-02-24: 500 mL via INTRAVENOUS

## 2018-02-24 MED ORDER — ONDANSETRON HCL 4 MG PO TABS: 4.0000 mg | ORAL_TABLET | Freq: Four times a day (QID) | ORAL | Status: DC | PRN

## 2018-02-24 MED ORDER — FLUOXETINE HCL 20 MG PO CAPS
40.0000 mg | ORAL_CAPSULE | Freq: Every day | ORAL | Status: DC
  Administered 2018-02-24 – 2018-02-25 (×2): 40 mg via ORAL
  Filled 2018-02-24 (×2): qty 2

## 2018-02-24 MED ORDER — OXYCODONE-ACETAMINOPHEN 5-325 MG PO TABS
1.0000 | ORAL_TABLET | ORAL | Status: DC | PRN
  Administered 2018-02-24: 1 via ORAL
  Filled 2018-02-24: qty 1

## 2018-02-24 MED ORDER — METOPROLOL TARTRATE 5 MG/5ML IV SOLN
5.0000 mg | Freq: Four times a day (QID) | INTRAVENOUS | Status: DC
  Administered 2018-02-24 – 2018-02-25 (×4): 5 mg via INTRAVENOUS
  Filled 2018-02-24 (×4): qty 5

## 2018-02-24 MED ORDER — SODIUM CHLORIDE 0.9 % IV SOLN
INTRAVENOUS | Status: DC
  Administered 2018-02-24: 20:00:00 via INTRAVENOUS

## 2018-02-24 MED ORDER — LEVOTHYROXINE SODIUM 25 MCG PO TABS
25.0000 ug | ORAL_TABLET | Freq: Every day | ORAL | Status: DC
  Administered 2018-02-25: 25 ug via ORAL
  Filled 2018-02-24: qty 1

## 2018-02-24 MED ORDER — MELOXICAM 7.5 MG PO TABS: 7.5000 mg | ORAL_TABLET | Freq: Every day | ORAL | Status: DC

## 2018-02-24 MED ORDER — METOCLOPRAMIDE HCL 10 MG PO TABS
10.0000 mg | ORAL_TABLET | Freq: Once | ORAL | Status: DC
  Filled 2018-02-24: qty 1

## 2018-02-24 MED ORDER — IOHEXOL 300 MG/ML  SOLN
100.0000 mL | Freq: Once | INTRAMUSCULAR | Status: AC | PRN
  Administered 2018-02-24: 100 mL via INTRAVENOUS

## 2018-02-24 MED ORDER — AMITRIPTYLINE HCL 25 MG PO TABS: 25.0000 mg | ORAL_TABLET | Freq: Every evening | ORAL | Status: DC | PRN

## 2018-02-24 MED ORDER — HYDRALAZINE HCL 20 MG/ML IJ SOLN: 5.0000 mg | INTRAMUSCULAR | Status: DC | PRN

## 2018-02-24 MED ORDER — ONDANSETRON HCL 4 MG/2ML IJ SOLN: 4.0000 mg | Freq: Four times a day (QID) | INTRAMUSCULAR | Status: DC | PRN

## 2018-02-24 MED ORDER — ONDANSETRON HCL 4 MG PO TABS: 4.0000 mg | ORAL_TABLET | Freq: Three times a day (TID) | ORAL | Status: DC | PRN

## 2018-02-24 MED ORDER — PREGABALIN 75 MG PO CAPS
150.0000 mg | ORAL_CAPSULE | Freq: Every day | ORAL | Status: DC
  Administered 2018-02-24 – 2018-02-25 (×2): 150 mg via ORAL
  Filled 2018-02-24 (×2): qty 2

## 2018-02-24 MED ORDER — PANTOPRAZOLE SODIUM 40 MG PO TBEC
40.0000 mg | DELAYED_RELEASE_TABLET | Freq: Every day | ORAL | Status: DC
  Administered 2018-02-24 – 2018-02-25 (×2): 40 mg via ORAL
  Filled 2018-02-24 (×2): qty 1

## 2018-02-24 MED ORDER — PEG-KCL-NACL-NASULF-NA ASC-C 100 G PO SOLR
0.5000 | Freq: Once | ORAL | Status: AC
  Administered 2018-02-25: 100 g via ORAL

## 2018-02-24 MED ORDER — METOCLOPRAMIDE HCL 10 MG PO TABS
10.0000 mg | ORAL_TABLET | Freq: Once | ORAL | Status: AC
  Administered 2018-02-24: 10 mg via ORAL
  Filled 2018-02-24: qty 1

## 2018-02-24 MED ORDER — MUPIROCIN 2 % EX OINT
1.0000 "application " | TOPICAL_OINTMENT | Freq: Two times a day (BID) | CUTANEOUS | Status: DC
  Administered 2018-02-24 – 2018-02-25 (×2): 1 via NASAL

## 2018-02-24 MED ORDER — RISPERIDONE 1 MG PO TABS
1.0000 mg | ORAL_TABLET | Freq: Every day | ORAL | Status: DC
  Administered 2018-02-24: 1 mg via ORAL
  Filled 2018-02-24: qty 1

## 2018-02-24 MED ORDER — SENNA 8.6 MG PO TABS: 1.0000 | ORAL_TABLET | Freq: Every day | ORAL | Status: DC | PRN

## 2018-02-24 MED ORDER — PEG-KCL-NACL-NASULF-NA ASC-C 100 G PO SOLR
0.5000 | Freq: Once | ORAL | Status: AC
  Administered 2018-02-24: 100 g via ORAL
  Filled 2018-02-24: qty 1

## 2018-02-24 MED ORDER — PEG-KCL-NACL-NASULF-NA ASC-C 100 G PO SOLR: 1.0000 | Freq: Once | ORAL | Status: DC

## 2018-02-24 NOTE — ED Notes (Signed)
ED Provider at bedside. 

## 2018-02-24 NOTE — ED Notes (Signed)
Patient transported to CT 

## 2018-02-24 NOTE — ED Triage Notes (Signed)
Pt reports to EMS that she had a fall on Monday at home when her left leg/knee gave out. Denies any LOC reporting that she crawled to the sofa to get up. Pt states that on Tuesday she noticed blood in her BM and has had some streaks of blood since. EMS report s Left BBB, pt denies any history of same. No LOC, no head injury, no thinners, no CP, no SOB.

## 2018-02-24 NOTE — ED Notes (Signed)
Patient transported to X-ray 

## 2018-02-24 NOTE — H&P (Signed)
History and Physical    Sarah Miranda ZOX:[id number] DOB: Sep 05, 1946 DOA: 02/24/2018  PCP: Rodrigo Ran, MD Patient coming from: home  Chief Complaint: falls, hematochezia  HPI: Sarah Miranda is a very pleasant 72 y.o. female with medical history significant for hypertension, hypothyroidism, fibromyalgia, bipolar disorder, presents to the emergency Department chief complaint hematochezia and frequent falls. Initial evaluation concerning for GI bleed and reveals an acute compression fracture at T7-T8. [facility] hospitalists are asked to admit  Information is obtained from the patient. She states that 4 days ago after a fall she developed bright red blood in her stools. She also states that her stools have become loose. Associated symptoms include intermittent cramping in her lower quadrants with bowel movements. She also states she has not want anything to eat due to intermittent nausea. She's had one emesis episode. She denies coffee ground emesis. She denies fever chills headache dizziness syncope or near-syncope. She denies dysuria hematuria frequency or urgency. She denies chest pain palpitation shortness of breath cough lower extremity edema or orthopnea. She has no history of GI bleed and reports a colonoscopy probably 15 years ago. She denies NSAID use or blood thinners. She also states that last month primary care provider stopped her oxycodone. Excedrin was recommended to manage her fibromyalgia. She reports an average of 5 Excedrin per day.she also reports some short-term memory loss and looks to her husband frequently for answers to questions.  In addition she complains of left leg jerking episodes that lead to her falling.she states during these episodes of her left leg jerking she also experiences "aura". Describes it as a lightheadedness. She denies a headache dizziness syncope or near-syncope but states she knows a fall was coming. He reports 5 days ago she fell backwards onto the carpet  she did not hit her head lose any consciousness. Liver she states 10 days ago she fell backwards in her head. She reports having a hematoma the back of her head that is since resolved. She is not take any blood thinners. Reports chronic chest pain that she does not feel is any different   ED Course: emergency department she's afebrile hemodynamically stable and not hypoxic.  Review of Systems: As per HPI otherwise all other systems reviewed and are negative.   Ambulatory Status: ambulates independently is independent with ADLs  Past Medical History:  Diagnosis Date  . Abnormal involuntary movements(781.0)   . Alopecia, unspecified   . Anemia, unspecified   . Anxiety   . Bipolar disorder, unspecified (HCC)   . Depression   . Dysuria   . Edema   . Encounter for long-term (current) use of other medications   . Esophageal reflux   . Fibromyalgia   . Hyperthyroidism   . Insomnia   . Lumbago   . Nonspecific elevation of levels of transaminase or lactic acid dehydrogenase (LDH)   . Other abnormal blood chemistry    hyperglycemia  . Other malaise and fatigue   . Pathologic fracture of vertebrae   . Reflux esophagitis   . Syncope and collapse   . Tachycardia, unspecified   . Unspecified essential hypertension   . Unspecified hypothyroidism     Past Surgical History:  Procedure Laterality Date  . CHOLECYSTECTOMY  10/2010   complicated by bile leak  . ERCP  11/03/2010   for bile leak  . LAPAROSCOPIC TUBAL LIGATION    . TONSILLECTOMY      Social History   Socioeconomic History  . Marital status: Married  Spouse name: Not on file  . Number of children: 2  . Years of education: Not on file  . Highest education level: Not on file  Occupational History  . Occupation: retired  Engineer, productionocial Needs  . Financial resource strain: Not hard at all  . Food insecurity:    Worry: Never true    Inability: Never true  . Transportation needs:    Medical: No    Non-medical: No  Tobacco  Use  . Smoking status: Never Smoker  . Smokeless tobacco: Never Used  Substance and Sexual Activity  . Alcohol use: No    Alcohol/week: 0.0 oz  . Drug use: No  . Sexual activity: Never  Lifestyle  . Physical activity:    Days per week: 0 days    Minutes per session: 0 min  . Stress: To some extent  Relationships  . Social connections:    Talks on phone: Three times a week    Gets together: Once a week    Attends religious service: More than 4 times per year    Active member of club or organization: No    Attends meetings of clubs or organizations: Never    Relationship status: Married  . Intimate partner violence:    Fear of current or ex partner: No    Emotionally abused: No    Physically abused: No    Forced sexual activity: No  Other Topics Concern  . Not on file  Social History Narrative  . Not on file    Allergies  Allergen Reactions  . Fetzima [Levomilnacipran] Nausea Only    Family History  Problem Relation Age of Onset  . Cancer Father        prostate  . Colon cancer Neg Hx     Prior to Admission medications   Medication Sig Start Date End Date Taking? Authorizing Provider  amitriptyline (ELAVIL) 25 MG tablet TAKE 1 TABLET(25 MG) BY MOUTH AT BEDTIME 01/31/18   Reed, Tiffany L, DO  esomeprazole (NEXIUM 24HR) 20 MG capsule Take 20 mg by mouth daily as needed (for acid reflex).    [provider]  FLUoxetine (PROZAC) 40 MG capsule TAKE 1 CAPSULE BY MOUTH DAILY TO HELP NERVES AND DEPRESSION 01/12/18   Reed, Tiffany L, DO  levothyroxine (SYNTHROID, LEVOTHROID) 25 MCG tablet TAKE 1 TABLET BY MOUTH EVERY DAY 01/05/18   Reed, Tiffany L, DO  meloxicam (MOBIC) 7.5 MG tablet Take 7.5 mg by mouth daily.    [provider]  Multiple Vitamin (MULTIVITAMIN WITH MINERALS) TABS tablet Take 1 tablet by mouth daily.    [provider]  ondansetron (ZOFRAN) 4 MG tablet TAKE 1 TABLET BY MOUTH EVERY 8 HOURS AS NEEDED FOR NAUSEA 09/12/17   Reed, Tiffany L, DO    pregabalin (LYRICA) 150 MG capsule Take 1 capsule (150 mg total) by mouth daily. 01/19/18   Reed, Tiffany L, DO  risperiDONE (RISPERDAL) 1 MG tablet TAKE 1 TABLET BY MOUTH TWICE DAILY TO HELP RELAX 06/13/17   Reed, Tiffany L, DO  senna (SENOKOT) 8.6 MG TABS tablet Take 1 tablet (8.6 mg total) by mouth daily as needed for mild constipation. 05/19/17   Kermit Baloeed, Tiffany L, DO    Physical Exam: Vitals:   02/24/18 1400 02/24/18 1415 02/24/18 1430 02/24/18 1445  BP:    (!) 145/101  Pulse:      Resp: (!) 21 16 19 14   Temp:      TempSrc:      SpO2:  Weight:      Height:         General:  Appears calm and comfortable sitting up in bed in no acute distress Eyes:  PERRL, EOMI, normal lids, iris ENT:  grossly normal hearing, lips & tongue, mucous membranes of her mouth are pink slightly dry Neck:  no LAD, masses or thyromegaly Cardiovascular:  Tachycardic but regular, no m/r/g. No LE edema.pedal pulses present and palpable  Respiratory:  CTA bilaterally, no w/r/r. Normal respiratory effort. Abdomen:  Soft,+ bowel sounds throughout no guarding or rebounding mild diffuse tenderness and lower quadrants. No guarding or rebounding Skin:  no rash or induration seen on limited exam Musculoskeletal:  grossly normal tone BUE/BLE, good ROM, no bony abnormality Psychiatric:  grossly normal mood and affect, speech fluent and appropriate, AOx3 Neurologic:  CN 2-12 grossly intact, moves all extremities in coordinated fashion, sensation intact  Labs on Admission: I have personally reviewed following labs and imaging studies  CBC: Recent Labs  Lab 02/24/18 1128  WBC 7.8  HGB 11.4*  HCT 37.8  MCV 86.5  PLT 280   Basic Metabolic Panel: Recent Labs  Lab 02/24/18 1128  NA 136  K 4.1  CL 100*  CO2 26  GLUCOSE 97  BUN 16  CREATININE 1.01*  CALCIUM 8.9   GFR: Estimated Creatinine Clearance: 48.2 mL/min (A) (by C-G formula based on SCr of 1.01 mg/dL (H)). Liver Function Tests: Recent Labs   Lab 02/24/18 1128  AST 15  ALT 9*  ALKPHOS 72  BILITOT 0.6  PROT 6.2*  ALBUMIN 3.4*   No results for input(s): LIPASE, AMYLASE in the last 168 hours. No results for input(s): AMMONIA in the last 168 hours. Coagulation Profile: No results for input(s): INR, PROTIME in the last 168 hours. Cardiac Enzymes: No results for input(s): CKTOTAL, CKMB, CKMBINDEX, TROPONINI in the last 168 hours. BNP (last 3 results) No results for input(s): PROBNP in the last 8760 hours. HbA1C: No results for input(s): HGBA1C in the last 72 hours. CBG: No results for input(s): GLUCAP in the last 168 hours. Lipid Profile: No results for input(s): CHOL, HDL, LDLCALC, TRIG, CHOLHDL, LDLDIRECT in the last 72 hours. Thyroid Function Tests: No results for input(s): TSH, T4TOTAL, FREET4, T3FREE, THYROIDAB in the last 72 hours. Anemia Panel: No results for input(s): VITAMINB12, FOLATE, FERRITIN, TIBC, IRON, RETICCTPCT in the last 72 hours. Urine analysis:    Component Value Date/Time   COLORURINE YELLOW 10/31/2010 1726   APPEARANCEUR CLEAR 10/31/2010 1726   LABSPEC 1.016 10/31/2010 1726   PHURINE 7.5 10/31/2010 1726   HGBUR SMALL (A) 10/31/2010 1726   BILIRUBINUR NEGATIVE 10/31/2010 1726   KETONESUR NEGATIVE 10/31/2010 1726   PROTEINUR NEGATIVE 10/31/2010 1726   UROBILINOGEN 4.0 (H) 10/31/2010 1726   NITRITE NEGATIVE 10/31/2010 1726   LEUKOCYTESUR NEGATIVE 10/31/2010 1726    Creatinine Clearance: Estimated Creatinine Clearance: 48.2 mL/min (A) (by C-G formula based on SCr of 1.01 mg/dL (H)).  Sepsis Labs: @LABRCNTIP (procalcitonin:4,lacticidven:4) )No results found for this or any previous visit (from the past 240 hour(s)).   Radiological Exams on Admission: Dg Chest 2 View  Result Date: 02/24/2018 CLINICAL DATA:  Patient having syncope episodes. started this morning. EXAM: CHEST - 2 VIEW COMPARISON:  02/03/2018 and multiple additional exams including 10/31/2010 FINDINGS: There is a large hiatal  hernia projecting at the LEFT lung base. These retrocardiac region is opacified in likely related to the large hiatal hernia. The RIGHT lung is clear. There is mild perihilar peribronchial thickening but no  pulmonary edema. Bones appear radiolucent. There multiple wedge compression fractures and endplate fractures throughout the thoracic and visualized lumbar spine. IMPRESSION: 1.  No evidence for acute  abnormality. 2. Large hiatal hernia is stable. 3. Numerous wedge compression fractures in the thoracic and lumbar spine. 4. Recommend further evaluation with bone mineral density exam (DXA). Electronically Signed   By: Norva Pavlov M.D.   On: 02/24/2018 12:05   Ct Head Wo Contrast  Result Date: 02/24/2018 CLINICAL DATA:  Pain following fall EXAM: CT HEAD WITHOUT CONTRAST CT CERVICAL SPINE WITHOUT CONTRAST TECHNIQUE: Multidetector CT imaging of the head and cervical spine was performed following the standard protocol without intravenous contrast. Multiplanar CT image reconstructions of the cervical spine were also generated. COMPARISON:  March 03, 2009 FINDINGS: CT HEAD FINDINGS Brain: There is stable moderate frontal and temporal lobe atrophy. There is mild atrophy elsewhere, stable. There is no intracranial mass, hemorrhage, extra-axial fluid collection, or midline shift. There is patchy small vessel disease in the centra semiovale bilaterally. Elsewhere gray-white compartments appear unremarkable. There is no evident acute infarct. Basal ganglia calcification bilaterally is felt to be physiologic in this age group. Vascular: There is no hyperdense vessel. There is calcification in each carotid siphon region. Skull: Bony calvarium appears intact. Sinuses/Orbits: There is a retention cyst in the inferior right maxillary antrum. There is opacification in the posterior right ethmoid air cells. Other paranasal sinuses are clear. There is rightward deviation of the nasal septum. Orbits appear symmetric  bilaterally. Other: Mastoid air cells are clear on the right. There is opacification in several peripheral mastoid air cells on the left, stable. CT CERVICAL SPINE FINDINGS Alignment: There is no appreciable spondylolisthesis. Skull base and vertebrae: Skull base and craniocervical junction regions appear normal. No evident fracture. There are no blastic or lytic bone lesions. Soft tissues and spinal canal: Prevertebral soft tissues and predental space regions are normal. There are no paraspinous lesions. There is no evident cord or canal hematoma. Disc levels: Disc spaces appear unremarkable. There is mild facet hypertrophy at several levels. No disc extrusion or stenosis evident. No nerve root edema or effacement. Upper chest: Visualized upper lung zones are clear. Other: There is calcification in each carotid artery. IMPRESSION: CT head: Stable areas of atrophy with patchy periventricular small vessel disease. No evident acute infarct. No mass or hemorrhage. There are foci of arterial vascular calcification. There is paranasal sinus disease at several sites as well as chronic left-sided mastoid disease. There is nasal septal deviation. CT cervical spine: No evident fracture or spondylolisthesis. There is mild osteoarthritic change at several levels. No nerve root edema or effacement. No disc extrusion or stenosis. There is carotid artery calcification bilaterally. Electronically Signed   By: Bretta Bang III M.D.   On: 02/24/2018 13:45   Ct Chest W Contrast  Result Date: 02/24/2018 CLINICAL DATA:  Fall 2 days ago. EXAM: CT CHEST, ABDOMEN, AND PELVIS WITH CONTRAST TECHNIQUE: Multidetector CT imaging of the chest, abdomen and pelvis was performed following the standard protocol during bolus administration of intravenous contrast. CONTRAST:  OMNIPAQUE IOHEXOL 300 MG/ML  SOLN COMPARISON:  Chest x-ray from same day. CT abdomen pelvis dated November 07, 2010. FINDINGS: CT CHEST FINDINGS Cardiovascular:  Normal heart size. No pericardial effusion. Normal caliber thoracic aorta. No aortic dissection. Aortic arch atherosclerosis. No central pulmonary embolism. Mediastinum/Nodes: No enlarged mediastinal, hilar, or axillary lymph nodes. Thyroid gland, trachea, and esophagus demonstrate no significant findings. Unchanged large hiatal hernia. Lungs/Pleura: No focal consolidation, pleural  effusion, or pneumothorax. 6 x 5 mm nodule in the left lower lobe. Minimal bilateral lower lobe subsegmental atelectasis. Scarring in the lingula. Musculoskeletal: Acute to subacute mild T7 and moderate T8 compression deformities with small amount of paravertebral hematoma. Unchanged chronic mild superior endplate compression deformities of T10, T11, and T12. No sternal or rib fracture. CT ABDOMEN PELVIS FINDINGS Hepatobiliary: Scattered subcentimeter low-density lesions in the liver are too small to characterize. Status post cholecystectomy. No biliary dilatation. Pancreas: Unremarkable. No pancreatic ductal dilatation or surrounding inflammatory changes. Spleen: Normal in size without focal abnormality. Adrenals/Urinary Tract: The adrenal glands are unremarkable. 1.4 cm simple cyst in the right kidney. Interval enlargement of the simple cyst in the left kidney, now measuring 5.1 cm. No renal or ureteral calculi. No hydronephrosis. The bladder is unremarkable. Stomach/Bowel: Large hiatal hernia containing stomach and the splenic flexure. No evidence of bowel wall thickening, distention, or surrounding inflammatory changes. Normal appendix. Vascular/Lymphatic: Mild aortic atherosclerosis. No enlarged abdominal or pelvic lymph nodes. Reproductive: Uterus and bilateral adnexa are unremarkable. Other: No free fluid or pneumoperitoneum. Musculoskeletal: No acute or significant osseous findings. Unchanged chronic moderate compression fracture of L2. IMPRESSION: 1. Acute to subacute mild T7 and moderate T8 compression fractures. 2. No other  evidence of traumatic injury within the chest, abdomen, or pelvis. 3. 6 mm nodule in the left lower lobe. Non-contrast chest CT at 6-12 months is recommended. If the nodule is stable at time of repeat CT, then future CT at 18-24 months (from today's scan) is considered optional for low-risk patients, but is recommended for high-risk patients. This recommendation follows the consensus statement: Guidelines for Management of Incidental Pulmonary Nodules Detected on CT Images: From the Fleischner Society 2017; Radiology 2017; 284:228-243. 4. Unchanged large hiatal hernia containing stomach and the splenic flexure. 5. Chronic compression fractures of T10 through T12 and L2. 6.  Aortic atherosclerosis (ICD10-I70.0). Electronically Signed   By: Obie Dredge M.D.   On: 02/24/2018 14:04   Ct Cervical Spine Wo Contrast  Result Date: 02/24/2018 CLINICAL DATA:  Pain following fall EXAM: CT HEAD WITHOUT CONTRAST CT CERVICAL SPINE WITHOUT CONTRAST TECHNIQUE: Multidetector CT imaging of the head and cervical spine was performed following the standard protocol without intravenous contrast. Multiplanar CT image reconstructions of the cervical spine were also generated. COMPARISON:  March 03, 2009 FINDINGS: CT HEAD FINDINGS Brain: There is stable moderate frontal and temporal lobe atrophy. There is mild atrophy elsewhere, stable. There is no intracranial mass, hemorrhage, extra-axial fluid collection, or midline shift. There is patchy small vessel disease in the centra semiovale bilaterally. Elsewhere gray-white compartments appear unremarkable. There is no evident acute infarct. Basal ganglia calcification bilaterally is felt to be physiologic in this age group. Vascular: There is no hyperdense vessel. There is calcification in each carotid siphon region. Skull: Bony calvarium appears intact. Sinuses/Orbits: There is a retention cyst in the inferior right maxillary antrum. There is opacification in the posterior right ethmoid  air cells. Other paranasal sinuses are clear. There is rightward deviation of the nasal septum. Orbits appear symmetric bilaterally. Other: Mastoid air cells are clear on the right. There is opacification in several peripheral mastoid air cells on the left, stable. CT CERVICAL SPINE FINDINGS Alignment: There is no appreciable spondylolisthesis. Skull base and vertebrae: Skull base and craniocervical junction regions appear normal. No evident fracture. There are no blastic or lytic bone lesions. Soft tissues and spinal canal: Prevertebral soft tissues and predental space regions are normal. There are no paraspinous lesions. There  is no evident cord or canal hematoma. Disc levels: Disc spaces appear unremarkable. There is mild facet hypertrophy at several levels. No disc extrusion or stenosis evident. No nerve root edema or effacement. Upper chest: Visualized upper lung zones are clear. Other: There is calcification in each carotid artery. IMPRESSION: CT head: Stable areas of atrophy with patchy periventricular small vessel disease. No evident acute infarct. No mass or hemorrhage. There are foci of arterial vascular calcification. There is paranasal sinus disease at several sites as well as chronic left-sided mastoid disease. There is nasal septal deviation. CT cervical spine: No evident fracture or spondylolisthesis. There is mild osteoarthritic change at several levels. No nerve root edema or effacement. No disc extrusion or stenosis. There is carotid artery calcification bilaterally. Electronically Signed   By: Bretta Bang III M.D.   On: 02/24/2018 13:45   Ct Abdomen Pelvis W Contrast  Result Date: 02/24/2018 CLINICAL DATA:  Fall 2 days ago. EXAM: CT CHEST, ABDOMEN, AND PELVIS WITH CONTRAST TECHNIQUE: Multidetector CT imaging of the chest, abdomen and pelvis was performed following the standard protocol during bolus administration of intravenous contrast. CONTRAST:  OMNIPAQUE IOHEXOL 300 MG/ML  SOLN  COMPARISON:  Chest x-ray from same day. CT abdomen pelvis dated November 07, 2010. FINDINGS: CT CHEST FINDINGS Cardiovascular: Normal heart size. No pericardial effusion. Normal caliber thoracic aorta. No aortic dissection. Aortic arch atherosclerosis. No central pulmonary embolism. Mediastinum/Nodes: No enlarged mediastinal, hilar, or axillary lymph nodes. Thyroid gland, trachea, and esophagus demonstrate no significant findings. Unchanged large hiatal hernia. Lungs/Pleura: No focal consolidation, pleural effusion, or pneumothorax. 6 x 5 mm nodule in the left lower lobe. Minimal bilateral lower lobe subsegmental atelectasis. Scarring in the lingula. Musculoskeletal: Acute to subacute mild T7 and moderate T8 compression deformities with small amount of paravertebral hematoma. Unchanged chronic mild superior endplate compression deformities of T10, T11, and T12. No sternal or rib fracture. CT ABDOMEN PELVIS FINDINGS Hepatobiliary: Scattered subcentimeter low-density lesions in the liver are too small to characterize. Status post cholecystectomy. No biliary dilatation. Pancreas: Unremarkable. No pancreatic ductal dilatation or surrounding inflammatory changes. Spleen: Normal in size without focal abnormality. Adrenals/Urinary Tract: The adrenal glands are unremarkable. 1.4 cm simple cyst in the right kidney. Interval enlargement of the simple cyst in the left kidney, now measuring 5.1 cm. No renal or ureteral calculi. No hydronephrosis. The bladder is unremarkable. Stomach/Bowel: Large hiatal hernia containing stomach and the splenic flexure. No evidence of bowel wall thickening, distention, or surrounding inflammatory changes. Normal appendix. Vascular/Lymphatic: Mild aortic atherosclerosis. No enlarged abdominal or pelvic lymph nodes. Reproductive: Uterus and bilateral adnexa are unremarkable. Other: No free fluid or pneumoperitoneum. Musculoskeletal: No acute or significant osseous findings. Unchanged chronic moderate  compression fracture of L2. IMPRESSION: 1. Acute to subacute mild T7 and moderate T8 compression fractures. 2. No other evidence of traumatic injury within the chest, abdomen, or pelvis. 3. 6 mm nodule in the left lower lobe. Non-contrast chest CT at 6-12 months is recommended. If the nodule is stable at time of repeat CT, then future CT at 18-24 months (from today's scan) is considered optional for low-risk patients, but is recommended for high-risk patients. This recommendation follows the consensus statement: Guidelines for Management of Incidental Pulmonary Nodules Detected on CT Images: From the Fleischner Society 2017; Radiology 2017; 284:228-243. 4. Unchanged large hiatal hernia containing stomach and the splenic flexure. 5. Chronic compression fractures of T10 through T12 and L2. 6.  Aortic atherosclerosis (ICD10-I70.0). Electronically Signed   By: Obie Dredge  M.D.   On: 02/24/2018 14:04   Ct T-spine No Charge  Result Date: 02/24/2018 CLINICAL DATA:  Fall 2 days ago. EXAM: CT THORACIC AND LUMBAR SPINE WITHOUT CONTRAST TECHNIQUE: Multidetector CT imaging of the thoracic and lumbar spine was performed without intravenous contrast. Multiplanar CT image reconstructions were also generated. COMPARISON:  CT abdomen pelvis dated November 07, 2010. FINDINGS: CT THORACIC SPINE FINDINGS Alignment: Mild dextroscoliosis, apex at T7-T8. Sagittal alignment is maintained. Vertebrae: Acute to subacute mild T7 and moderate T8 compression fractures with a small amount of paravertebral hematoma. No retropulsion. Chronic mild superior endplate compression fractures of T10 through T12. Osteopenia. Paraspinal and other soft tissues: Please see separate CT chest report from same day. Disc levels: Disc heights are relatively preserved. No significant spinal canal or neuroforaminal stenosis. CT LUMBAR SPINE FINDINGS Segmentation: 5 lumbar type vertebrae. Alignment: Normal. Vertebrae: No acute fracture. Chronic moderate superior  endplate compression fracture of L2 with 2-3 mm retropulsion. Osteopenia. Paraspinal and other soft tissues: Please see separate CT abdomen and pelvis report from same day. Disc levels: Disc heights are relatively preserved. No significant spinal canal or neuroforaminal stenosis. IMPRESSION: 1. Acute to subacute mild T7 and moderate T8 compression fractures. 2. Chronic compression fractures of T10 through T12 and L2. Minimal retropulsion at L2 is unchanged. Electronically Signed   By: Obie Dredge M.D.   On: 02/24/2018 14:13   Ct L-spine No Charge  Result Date: 02/24/2018 CLINICAL DATA:  Fall 2 days ago. EXAM: CT THORACIC AND LUMBAR SPINE WITHOUT CONTRAST TECHNIQUE: Multidetector CT imaging of the thoracic and lumbar spine was performed without intravenous contrast. Multiplanar CT image reconstructions were also generated. COMPARISON:  CT abdomen pelvis dated November 07, 2010. FINDINGS: CT THORACIC SPINE FINDINGS Alignment: Mild dextroscoliosis, apex at T7-T8. Sagittal alignment is maintained. Vertebrae: Acute to subacute mild T7 and moderate T8 compression fractures with a small amount of paravertebral hematoma. No retropulsion. Chronic mild superior endplate compression fractures of T10 through T12. Osteopenia. Paraspinal and other soft tissues: Please see separate CT chest report from same day. Disc levels: Disc heights are relatively preserved. No significant spinal canal or neuroforaminal stenosis. CT LUMBAR SPINE FINDINGS Segmentation: 5 lumbar type vertebrae. Alignment: Normal. Vertebrae: No acute fracture. Chronic moderate superior endplate compression fracture of L2 with 2-3 mm retropulsion. Osteopenia. Paraspinal and other soft tissues: Please see separate CT abdomen and pelvis report from same day. Disc levels: Disc heights are relatively preserved. No significant spinal canal or neuroforaminal stenosis. IMPRESSION: 1. Acute to subacute mild T7 and moderate T8 compression fractures. 2. Chronic  compression fractures of T10 through T12 and L2. Minimal retropulsion at L2 is unchanged. Electronically Signed   By: Obie Dredge M.D.   On: 02/24/2018 14:13    EKG: Independently reviewed. Sinus tachycardia Left bundle branch block  Assessment/Plan Principal Problem:   Hematochezia Active Problems:   Compression fracture of T7 vertebra (HCC)   Fibromyalgia   Anxiety   Hypothyroidism   Essential hypertension   Bipolar disorder (HCC)   #1. Hematochezia.CT of the abdomen unchanged hiatal hernia gout evidence of colitis. Has been using large doses of Excedrin of late as well as mobic. Evaluated by GI command upper endoscopy and colonoscopy for tomorrow -Admit -Clear liquids today -Nothing by mouth past midnight -Prep per GI -Serial CBCs -Continue PPI  #2. Compression fracture of T7-T8. Patient with frequent falls of late. She also has chronic musculoskeletal pain. Complains of no back pain during exam. Home medications include Excedrin. Recently-oxycodone discontinued for  her chronic pain. -continue home lyrica -hold home mobic -low dose oxy as needed -physical therpay  #3. Hypertension.blood pressure high end of normal in the emergency department. Home medications include no antihypertensives. -IV lopressor with paramenters -monitor  #4. Bipolar disorder. Appears stable at baseline  5. Hypothyroidism. -tsh  #6. Tachycardia. Likely related to dehydration and anxiety.  ekg as noted above. Given iv fluids in ED. No chest pain. Improving at time of admission -continue IV fluids -IV lopressor with parameters     DVT prophylaxis: scd  Code Status: full  Family Communication: husband  Disposition Plan: home  Consults called: gi sara gribbon  Admission status: inpatient    Gwenyth Bender MD [facility]  If 7PM-7AM, please contact night-coverage [email or web address] Password [email or web address]  02/24/2018, 3:38 PM

## 2018-02-24 NOTE — H&P (View-Only) (Signed)
[facility] Consult: 3:08 PM 02/24/2018  LOS: 0 days    Referring Provider: Dr Rodena Medin in ED  Primary Care Physician:  Bufford Spikes of [facility]  >> will be establishing with Dr. Rodrigo Ran within the next several weeks Primary Gastroenterologist:   Leone Payor for ERCP   Reason for Consultation:  Bleeding PR.     HPI: Sarah Miranda is a 72 y.o. female.  Hx fibromyalgia.  Anxiety, bipolar d/o.  Hypothyroidism. HTN.  Chronic pain.    Microcytic anemia 05/2017, Hgb 3.8 to 7.6 after 3 U PRBCs, MCV 68.  Discharged on oral iron, contd on Nexium.     Post cholecystectomy bile leak.  Complicated by VDRF.   10/2010 ERCP with stent placement.  HH noted.   01/2011 ERCP with stent removal.   He says she is colonoscopy remotely but it at least 15 years.  There is no record confirming colonoscopy in Epic  Patient followed up regarding her anemia with her PMD.  There was talk a referral back to GI but it never happened.  Patient opted for fecal occult blood testing and completed cards at home.  These cards were FOBT negative. For 5 weeks ago, her than primary MD, stopped her oxycodone.  She takes Mobic daily but to deal with her fibromyalgia pain the patient started using Excedrin daily, on average 5 tablets a day.  She has had intermittent nausea despite taking the Nexium daily, this is generally stable other than earlier this week.  PRN Zofran prescribed and helped.  Denies dysphagia, heartburn, weight loss.  Monday, 5 days ago, and Tuesday she had a acute gastrointestinal illness with nausea but no emesis.  Diarrhea of brown watery stool but she saw blood mixed in.  By Wednesday she was feeling better and stools were formed but she still saw a bit of blood.  Saw some blood yesterday as well.  No recurrent nausea.  Has pain bilaterally  in her lower abdomen of moderate severity. About 10 days ago patient fell backwards and hit the back of her head sustaining a hematoma but had no neurologic symptoms, no bleeding.  On Monday she fell and sustained injury to her back and worsening of her chronic back pain Because of the pain in the recent bleeding stools and her generally not feeling well she presented to the ED today. Hgb 11.4.  Was 12.7 on 01/19/18.  FOBT negative.   LFTs normal.    CT with contrast.  Abdomen, pelvis, chest.  Scattered subcentimeter low-density hepatic lesions, TSTC.  Acute and subacute compression fxs in T spine.  6 mm nodule in left lower lobe.  Large HH contains stomach and splenic gflexure.   Head CT: stable atrophy and small vsl dz.  No c spine fractures.  bil carotid stenosis.  Recommendation is to repeat the chest CT for eval of the lung nodule 6 to 12 months.    She does not drink alcoholic beverages.  There is no family history of ulcer disease, anemia, GI bleeds, colon cancer.   Past Medical History:  Diagnosis Date  . Abnormal involuntary movements(781.0)   . Alopecia, unspecified   . Anemia, unspecified   . Anxiety   . Bipolar disorder, unspecified (HCC)   . Depression   . Dysuria   . Edema   . Encounter for long-term (current) use of other medications   . Esophageal reflux   . Fibromyalgia   . Hyperthyroidism   . Insomnia   . Lumbago   . Nonspecific elevation of levels of transaminase or lactic acid dehydrogenase (LDH)   . Other abnormal blood chemistry    hyperglycemia  . Other malaise and fatigue   . Pathologic fracture of vertebrae   . Reflux esophagitis   . Syncope and collapse   . Tachycardia, unspecified   . Unspecified essential hypertension   . Unspecified hypothyroidism     Past Surgical History:  Procedure Laterality Date  . CHOLECYSTECTOMY  10/2010   complicated by bile leak  . ERCP  11/03/2010   for bile leak  . LAPAROSCOPIC TUBAL LIGATION    . TONSILLECTOMY       Prior to Admission medications   Medication Sig Start Date End Date Taking? Authorizing Provider  amitriptyline (ELAVIL) 25 MG tablet TAKE 1 TABLET(25 MG) BY MOUTH AT BEDTIME 01/31/18   Reed, Tiffany L, DO  esomeprazole (NEXIUM 24HR) 20 MG capsule Take 20 mg by mouth daily as needed (for acid reflex).    [provider]  FLUoxetine (PROZAC) 40 MG capsule TAKE 1 CAPSULE BY MOUTH DAILY TO HELP NERVES AND DEPRESSION 01/12/18   Reed, Tiffany L, DO  levothyroxine (SYNTHROID, LEVOTHROID) 25 MCG tablet TAKE 1 TABLET BY MOUTH EVERY DAY 01/05/18   Reed, Tiffany L, DO  meloxicam (MOBIC) 7.5 MG tablet Take 7.5 mg by mouth daily.    [provider]  Multiple Vitamin (MULTIVITAMIN WITH MINERALS) TABS tablet Take 1 tablet by mouth daily.    [provider]  ondansetron (ZOFRAN) 4 MG tablet TAKE 1 TABLET BY MOUTH EVERY 8 HOURS AS NEEDED FOR NAUSEA 09/12/17   Reed, Tiffany L, DO  pregabalin (LYRICA) 150 MG capsule Take 1 capsule (150 mg total) by mouth daily. 01/19/18   Reed, Tiffany L, DO  risperiDONE (RISPERDAL) 1 MG tablet TAKE 1 TABLET BY MOUTH TWICE DAILY TO HELP RELAX 06/13/17   Reed, Tiffany L, DO  senna (SENOKOT) 8.6 MG TABS tablet Take 1 tablet (8.6 mg total) by mouth daily as needed for mild constipation. 05/19/17   Reed, Tiffany L, DO    Scheduled Meds:  Infusions:  PRN Meds:    Allergies as of 02/24/2018 - Review Complete 02/24/2018  Allergen Reaction Noted  . Fetzima [levomilnacipran] Nausea Only 01/17/2017    Family History  Problem Relation Age of Onset  . Cancer Father        prostate  . Colon cancer Neg Hx     Social History   Socioeconomic History  . Marital status: Married    Spouse name: Not on file  . Number of children: 2  . Years of education: Not on file  . Highest education level: Not on file  Occupational History  . Occupation: retired  Engineer, production  . Financial resource strain: Not hard at all  . Food insecurity:    Worry: Never true     Inability: Never true  . Transportation needs:    Medical: No    Non-medical: No  Tobacco Use  . Smoking status: Never Smoker  . Smokeless tobacco: Never Used  Substance and  Sexual Activity  . Alcohol use: No    Alcohol/week: 0.0 oz  . Drug use: No  . Sexual activity: Never  Lifestyle  . Physical activity:    Days per week: 0 days    Minutes per session: 0 min  . Stress: To some extent  Relationships  . Social connections:    Talks on phone: Three times a week    Gets together: Once a week    Attends religious service: More than 4 times per year    Active member of club or organization: No    Attends meetings of clubs or organizations: Never    Relationship status: Married  . Intimate partner violence:    Fear of current or ex partner: No    Emotionally abused: No    Physically abused: No    Forced sexual activity: No  Other Topics Concern  . Not on file  Social History Narrative  . Not on file    REVIEW OF SYSTEMS: Constitutional: Weakness, fatigue somewhat chronic but worse in the last few days.  Generally sedentary, spends most of her day sitting on the couch activity involves moving from the couch to the bathroom. ENT:  No nose bleeds Pulm: Breathing.  Cough. CV:  No palpitations, no LE edema.  Chest pain. GU:  No hematuria, no frequency GI:  Per HPI Heme: No unusual bleeding or bruising Transfusions:  Per HPI Neuro: Occasional involuntary leg movement.  Balance problems and unsteady gait.  Feels weak in her legs.  No headaches, no peripheral tingling or numbness Derm:  No itching, no rash or sores.  Endocrine:  No sweats or chills.  No polyuria or dysuria Immunization: Not coronary. Travel:  None beyond local counties in last few months.    PHYSICAL EXAM: Vital signs in last 24 hours: Vitals:   02/24/18 1430 02/24/18 1445  BP:  (!) 145/101  Pulse:    Resp: 19 14  Temp:    SpO2:     Wt Readings from Last 3 Encounters:  02/24/18 153 lb (69.4 kg)   02/07/18 158 lb 12.8 oz (72 kg)  01/19/18 158 lb (71.7 kg)    General: Mildly chronically but not acutely ill-appearing WF.  Alert. Head: No facial asymmetry or swelling.  No signs of head trauma. Eyes: No scleral icterus.  No conjunctival pallor.  EOMI. Ears: Hard of hearing. Nose: No discharge or congestion. Mouth: Oropharynx moist, clear, pink. Neck: No JVD, no masses, no thyromegaly. Lungs: Clear bilaterally.  No cough or labored breathing. Heart: RRR.  No MRG.  S1, S2 present. Abdomen: Soft.  Slight tenderness in the lower abdomen more so on the left.  No HSM, masses, bruits.  Bowel sounds active..   Rectal: No masses.  No stool, no blood.  No visible or palpable hemorrhoids Musc/Skeltl: No joint redness or swelling.  No significant contractures or deformity Extremities: No CCE. Neurologic: Alert.  Oriented x3.  Moves all 4 limbs.  No tremors.  Limb strength not tested Skin: No significant bruising, no cuts Tattoos: None Nodes: No cervical Psych: Pleasant, minimally anxious  Intake/Output from previous day: No intake/output data recorded. Intake/Output this shift: No intake/output data recorded.  LAB RESULTS: Recent Labs    02/24/18 1128  WBC 7.8  HGB 11.4*  HCT 37.8  PLT 280   BMET Lab Results  Component Value Date   NA 136 02/24/2018   NA 138 01/19/2018   NA 135 09/15/2017   K 4.1 02/24/2018   K  4.0 01/19/2018   K 4.4 09/15/2017   CL 100 (L) 02/24/2018   CL 101 01/19/2018   CL 99 09/15/2017   CO2 26 02/24/2018   CO2 30 01/19/2018   CO2 30 09/15/2017   GLUCOSE 97 02/24/2018   GLUCOSE 89 01/19/2018   GLUCOSE 105 (H) 09/15/2017   BUN 16 02/24/2018   BUN 13 01/19/2018   BUN 9 09/15/2017   CREATININE 1.01 (H) 02/24/2018   CREATININE 0.93 01/19/2018   CREATININE 0.76 09/15/2017   CALCIUM 8.9 02/24/2018   CALCIUM 9.3 01/19/2018   CALCIUM 8.3 (L) 09/15/2017   LFT Recent Labs    02/24/18 1128  PROT 6.2*  ALBUMIN 3.4*  AST 15  ALT 9*  ALKPHOS 72   BILITOT 0.6  BILIDIR 0.1  IBILI 0.5   PT/INR Lab Results  Component Value Date   INR 1.36 11/02/2010   Hepatitis Panel No results for input(s): HEPBSAG, HCVAB, HEPAIGM, HEPBIGM in the last 72 hours. C-Diff No components found for: CDIFF Lipase     Component Value Date/Time   LIPASE 10 (L) 11/04/2010 0430    Drugs of Abuse  No results found for: LABOPIA, COCAINSCRNUR, LABBENZ, AMPHETMU, THCU, LABBARB   RADIOLOGY STUDIES: Dg Chest 2 View  Result Date: 02/24/2018 CLINICAL DATA:  Patient having syncope episodes. started this morning. EXAM: CHEST - 2 VIEW COMPARISON:  02/03/2018 and multiple additional exams including 10/31/2010 FINDINGS: There is a large hiatal hernia projecting at the LEFT lung base. These retrocardiac region is opacified in likely related to the large hiatal hernia. The RIGHT lung is clear. There is mild perihilar peribronchial thickening but no pulmonary edema. Bones appear radiolucent. There multiple wedge compression fractures and endplate fractures throughout the thoracic and visualized lumbar spine. IMPRESSION: 1.  No evidence for acute  abnormality. 2. Large hiatal hernia is stable. 3. Numerous wedge compression fractures in the thoracic and lumbar spine. 4. Recommend further evaluation with bone mineral density exam (DXA). Electronically Signed   By: Norva Pavlov M.D.   On: 02/24/2018 12:05   Ct Head Wo Contrast  Result Date: 02/24/2018 CLINICAL DATA:  Pain following fall EXAM: CT HEAD WITHOUT CONTRAST CT CERVICAL SPINE WITHOUT CONTRAST TECHNIQUE: Multidetector CT imaging of the head and cervical spine was performed following the standard protocol without intravenous contrast. Multiplanar CT image reconstructions of the cervical spine were also generated. COMPARISON:  March 03, 2009 FINDINGS: CT HEAD FINDINGS Brain: There is stable moderate frontal and temporal lobe atrophy. There is mild atrophy elsewhere, stable. There is no intracranial mass, hemorrhage,  extra-axial fluid collection, or midline shift. There is patchy small vessel disease in the centra semiovale bilaterally. Elsewhere gray-white compartments appear unremarkable. There is no evident acute infarct. Basal ganglia calcification bilaterally is felt to be physiologic in this age group. Vascular: There is no hyperdense vessel. There is calcification in each carotid siphon region. Skull: Bony calvarium appears intact. Sinuses/Orbits: There is a retention cyst in the inferior right maxillary antrum. There is opacification in the posterior right ethmoid air cells. Other paranasal sinuses are clear. There is rightward deviation of the nasal septum. Orbits appear symmetric bilaterally. Other: Mastoid air cells are clear on the right. There is opacification in several peripheral mastoid air cells on the left, stable. CT CERVICAL SPINE FINDINGS Alignment: There is no appreciable spondylolisthesis. Skull base and vertebrae: Skull base and craniocervical junction regions appear normal. No evident fracture. There are no blastic or lytic bone lesions. Soft tissues and spinal canal: Prevertebral soft tissues  and predental space regions are normal. There are no paraspinous lesions. There is no evident cord or canal hematoma. Disc levels: Disc spaces appear unremarkable. There is mild facet hypertrophy at several levels. No disc extrusion or stenosis evident. No nerve root edema or effacement. Upper chest: Visualized upper lung zones are clear. Other: There is calcification in each carotid artery. IMPRESSION: CT head: Stable areas of atrophy with patchy periventricular small vessel disease. No evident acute infarct. No mass or hemorrhage. There are foci of arterial vascular calcification. There is paranasal sinus disease at several sites as well as chronic left-sided mastoid disease. There is nasal septal deviation. CT cervical spine: No evident fracture or spondylolisthesis. There is mild osteoarthritic change at  several levels. No nerve root edema or effacement. No disc extrusion or stenosis. There is carotid artery calcification bilaterally. Electronically Signed   By: Bretta Bang III M.D.   On: 02/24/2018 13:45   Ct Chest W Contrast  Result Date: 02/24/2018 CLINICAL DATA:  Fall 2 days ago. EXAM: CT CHEST, ABDOMEN, AND PELVIS WITH CONTRAST TECHNIQUE: Multidetector CT imaging of the chest, abdomen and pelvis was performed following the standard protocol during bolus administration of intravenous contrast. CONTRAST:  OMNIPAQUE IOHEXOL 300 MG/ML  SOLN COMPARISON:  Chest x-ray from same day. CT abdomen pelvis dated November 07, 2010. FINDINGS: CT CHEST FINDINGS Cardiovascular: Normal heart size. No pericardial effusion. Normal caliber thoracic aorta. No aortic dissection. Aortic arch atherosclerosis. No central pulmonary embolism. Mediastinum/Nodes: No enlarged mediastinal, hilar, or axillary lymph nodes. Thyroid gland, trachea, and esophagus demonstrate no significant findings. Unchanged large hiatal hernia. Lungs/Pleura: No focal consolidation, pleural effusion, or pneumothorax. 6 x 5 mm nodule in the left lower lobe. Minimal bilateral lower lobe subsegmental atelectasis. Scarring in the lingula. Musculoskeletal: Acute to subacute mild T7 and moderate T8 compression deformities with small amount of paravertebral hematoma. Unchanged chronic mild superior endplate compression deformities of T10, T11, and T12. No sternal or rib fracture. CT ABDOMEN PELVIS FINDINGS Hepatobiliary: Scattered subcentimeter low-density lesions in the liver are too small to characterize. Status post cholecystectomy. No biliary dilatation. Pancreas: Unremarkable. No pancreatic ductal dilatation or surrounding inflammatory changes. Spleen: Normal in size without focal abnormality. Adrenals/Urinary Tract: The adrenal glands are unremarkable. 1.4 cm simple cyst in the right kidney. Interval enlargement of the simple cyst in the left kidney,  now measuring 5.1 cm. No renal or ureteral calculi. No hydronephrosis. The bladder is unremarkable. Stomach/Bowel: Large hiatal hernia containing stomach and the splenic flexure. No evidence of bowel wall thickening, distention, or surrounding inflammatory changes. Normal appendix. Vascular/Lymphatic: Mild aortic atherosclerosis. No enlarged abdominal or pelvic lymph nodes. Reproductive: Uterus and bilateral adnexa are unremarkable. Other: No free fluid or pneumoperitoneum. Musculoskeletal: No acute or significant osseous findings. Unchanged chronic moderate compression fracture of L2. IMPRESSION: 1. Acute to subacute mild T7 and moderate T8 compression fractures. 2. No other evidence of traumatic injury within the chest, abdomen, or pelvis. 3. 6 mm nodule in the left lower lobe. Non-contrast chest CT at 6-12 months is recommended. If the nodule is stable at time of repeat CT, then future CT at 18-24 months (from today's scan) is considered optional for low-risk patients, but is recommended for high-risk patients. This recommendation follows the consensus statement: Guidelines for Management of Incidental Pulmonary Nodules Detected on CT Images: From the Fleischner Society 2017; Radiology 2017; 284:228-243. 4. Unchanged large hiatal hernia containing stomach and the splenic flexure. 5. Chronic compression fractures of T10 through T12 and L2. 6.  Aortic atherosclerosis (ICD10-I70.0). Electronically Signed   By: Obie DredgeWilliam T Derry M.D.   On: 02/24/2018 14:04   Ct Cervical Spine Wo Contrast  Result Date: 02/24/2018 CLINICAL DATA:  Pain following fall EXAM: CT HEAD WITHOUT CONTRAST CT CERVICAL SPINE WITHOUT CONTRAST TECHNIQUE: Multidetector CT imaging of the head and cervical spine was performed following the standard protocol without intravenous contrast. Multiplanar CT image reconstructions of the cervical spine were also generated. COMPARISON:  March 03, 2009 FINDINGS: CT HEAD FINDINGS Brain: There is stable moderate  frontal and temporal lobe atrophy. There is mild atrophy elsewhere, stable. There is no intracranial mass, hemorrhage, extra-axial fluid collection, or midline shift. There is patchy small vessel disease in the centra semiovale bilaterally. Elsewhere gray-white compartments appear unremarkable. There is no evident acute infarct. Basal ganglia calcification bilaterally is felt to be physiologic in this age group. Vascular: There is no hyperdense vessel. There is calcification in each carotid siphon region. Skull: Bony calvarium appears intact. Sinuses/Orbits: There is a retention cyst in the inferior right maxillary antrum. There is opacification in the posterior right ethmoid air cells. Other paranasal sinuses are clear. There is rightward deviation of the nasal septum. Orbits appear symmetric bilaterally. Other: Mastoid air cells are clear on the right. There is opacification in several peripheral mastoid air cells on the left, stable. CT CERVICAL SPINE FINDINGS Alignment: There is no appreciable spondylolisthesis. Skull base and vertebrae: Skull base and craniocervical junction regions appear normal. No evident fracture. There are no blastic or lytic bone lesions. Soft tissues and spinal canal: Prevertebral soft tissues and predental space regions are normal. There are no paraspinous lesions. There is no evident cord or canal hematoma. Disc levels: Disc spaces appear unremarkable. There is mild facet hypertrophy at several levels. No disc extrusion or stenosis evident. No nerve root edema or effacement. Upper chest: Visualized upper lung zones are clear. Other: There is calcification in each carotid artery. IMPRESSION: CT head: Stable areas of atrophy with patchy periventricular small vessel disease. No evident acute infarct. No mass or hemorrhage. There are foci of arterial vascular calcification. There is paranasal sinus disease at several sites as well as chronic left-sided mastoid disease. There is nasal  septal deviation. CT cervical spine: No evident fracture or spondylolisthesis. There is mild osteoarthritic change at several levels. No nerve root edema or effacement. No disc extrusion or stenosis. There is carotid artery calcification bilaterally. Electronically Signed   By: Bretta BangWilliam  Woodruff III M.D.   On: 02/24/2018 13:45   Ct Abdomen Pelvis W Contrast  Result Date: 02/24/2018 CLINICAL DATA:  Fall 2 days ago. EXAM: CT CHEST, ABDOMEN, AND PELVIS WITH CONTRAST TECHNIQUE: Multidetector CT imaging of the chest, abdomen and pelvis was performed following the standard protocol during bolus administration of intravenous contrast. CONTRAST:  100mL OMNIPAQUE IOHEXOL 300 MG/ML  SOLN COMPARISON:  Chest x-ray from same day. CT abdomen pelvis dated November 07, 2010. FINDINGS: CT CHEST FINDINGS Cardiovascular: Normal heart size. No pericardial effusion. Normal caliber thoracic aorta. No aortic dissection. Aortic arch atherosclerosis. No central pulmonary embolism. Mediastinum/Nodes: No enlarged mediastinal, hilar, or axillary lymph nodes. Thyroid gland, trachea, and esophagus demonstrate no significant findings. Unchanged large hiatal hernia. Lungs/Pleura: No focal consolidation, pleural effusion, or pneumothorax. 6 x 5 mm nodule in the left lower lobe. Minimal bilateral lower lobe subsegmental atelectasis. Scarring in the lingula. Musculoskeletal: Acute to subacute mild T7 and moderate T8 compression deformities with small amount of paravertebral hematoma. Unchanged chronic mild superior endplate compression deformities of T10, T11, and  T12. No sternal or rib fracture. CT ABDOMEN PELVIS FINDINGS Hepatobiliary: Scattered subcentimeter low-density lesions in the liver are too small to characterize. Status post cholecystectomy. No biliary dilatation. Pancreas: Unremarkable. No pancreatic ductal dilatation or surrounding inflammatory changes. Spleen: Normal in size without focal abnormality. Adrenals/Urinary Tract: The adrenal  glands are unremarkable. 1.4 cm simple cyst in the right kidney. Interval enlargement of the simple cyst in the left kidney, now measuring 5.1 cm. No renal or ureteral calculi. No hydronephrosis. The bladder is unremarkable. Stomach/Bowel: Large hiatal hernia containing stomach and the splenic flexure. No evidence of bowel wall thickening, distention, or surrounding inflammatory changes. Normal appendix. Vascular/Lymphatic: Mild aortic atherosclerosis. No enlarged abdominal or pelvic lymph nodes. Reproductive: Uterus and bilateral adnexa are unremarkable. Other: No free fluid or pneumoperitoneum. Musculoskeletal: No acute or significant osseous findings. Unchanged chronic moderate compression fracture of L2. IMPRESSION: 1. Acute to subacute mild T7 and moderate T8 compression fractures. 2. No other evidence of traumatic injury within the chest, abdomen, or pelvis. 3. 6 mm nodule in the left lower lobe. Non-contrast chest CT at 6-12 months is recommended. If the nodule is stable at time of repeat CT, then future CT at 18-24 months (from today's scan) is considered optional for low-risk patients, but is recommended for high-risk patients. This recommendation follows the consensus statement: Guidelines for Management of Incidental Pulmonary Nodules Detected on CT Images: From the Fleischner Society 2017; Radiology 2017; 284:228-243. 4. Unchanged large hiatal hernia containing stomach and the splenic flexure. 5. Chronic compression fractures of T10 through T12 and L2. 6.  Aortic atherosclerosis (ICD10-I70.0). Electronically Signed   By: Obie Dredge M.D.   On: 02/24/2018 14:04   Ct T-spine No Charge Ct L-spine No Charge  Result Date: 02/24/2018 CLINICAL DATA:  Fall 2 days ago. EXAM: CT THORACIC AND LUMBAR SPINE WITHOUT CONTRAST TECHNIQUE: Multidetector CT imaging of the thoracic and lumbar spine was performed without intravenous contrast. Multiplanar CT image reconstructions were also generated. COMPARISON:  CT  abdomen pelvis dated November 07, 2010. FINDINGS: CT THORACIC SPINE FINDINGS Alignment: Mild dextroscoliosis, apex at T7-T8. Sagittal alignment is maintained. Vertebrae: Acute to subacute mild T7 and moderate T8 compression fractures with a small amount of paravertebral hematoma. No retropulsion. Chronic mild superior endplate compression fractures of T10 through T12. Osteopenia. Paraspinal and other soft tissues: Please see separate CT chest report from same day. Disc levels: Disc heights are relatively preserved. No significant spinal canal or neuroforaminal stenosis. CT LUMBAR SPINE FINDINGS Segmentation: 5 lumbar type vertebrae. Alignment: Normal. Vertebrae: No acute fracture. Chronic moderate superior endplate compression fracture of L2 with 2-3 mm retropulsion. Osteopenia. Paraspinal and other soft tissues: Please see separate CT abdomen and pelvis report from same day. Disc levels: Disc heights are relatively preserved. No significant spinal canal or neuroforaminal stenosis. IMPRESSION: 1. Acute to subacute mild T7 and moderate T8 compression fractures. 2. Chronic compression fractures of T10 through T12 and L2. Minimal retropulsion at L2 is unchanged. Electronically Signed   By: Obie Dredge M.D.   On: 02/24/2018 14:13     IMPRESSION:   *   Bloody stools, initially diarrhea.  Some abdominal pain. Question ischemic colitis, though no evidence of colitis on contrast CT..  *    Intermittent nausea despite daily PPI.  Large hiatal hernia per CT scan.    *    Acute on chronic compression fractures in patient with chronic musculoskeletal pain.  Previous PMD discontinued oxycodone last month and she is been using Excedrin in large  doses since then.  Chronic Mobic.     PLAN:     *   Upper endoscopy and colonoscopy set for around 10:00 tomorrow morning.  See orders for split dose prep.  Can have clear liquids today.  Continue oral PPI.   Jennye Moccasin  02/24/2018, 3:08 PM Phone [phone]  [phone]     Attending physician's note   I have taken a history, examined the patient and reviewed the chart. I agree with the Advanced Practitioner's note, impression and recommendations. Hospitalized with bloody stool and a microcytic anemia was diagnosed in 05/2017. Trend CBC. PPI po. Plan for colonoscopy and EGD tomorrow.   Claudette Head, MD FACG [phone] office

## 2018-02-24 NOTE — ED Provider Notes (Signed)
[facility] EMERGENCY DEPARTMENT Provider Note   CSN: [id number] Arrival date & time: 02/24/18  1109     History   Chief Complaint Chief Complaint  Patient presents with  . Abdominal Pain    HPI Sarah Miranda is a 72 y.o. female with a history of thyroid disease, anemia, a  involuntary leg movements who presents to the emergency department with a chief complaint of hematochezia.  The patient reports that she has had involuntary jerking of the left leg intermittently for some time.  She reports that she has never seen a neurologist to have this worked up.  She reports that when her leg begins to jerk that she becomes unsteady on her feet and has had several falls.  She last fell 5 days ago backwards onto carpet.  She denies hitting her head, LOC, nausea, or emesis.  She reports that a week and a half ago that she fell backwards and hit her head.  She had a hematoma to the back of her head that has since resolved.  She does not take any blood thinners.  She reports that 4 days ago, after the most recent fall, that she began to have bright red blood in her loose stools every time she went to have a bowel movement.   She reports bilateral lower abdominal pain that that has been constant and is worse with palpation and nausea.  She denies fever, chills, emesis, dysuria, hematuria, constipation, or vaginal bleeding or discharge.  No history of GI bleed.  She reports that she was admitted in September 2018 for symptomatic iron deficiency anemia after she was found to have a hemoglobin of 3.8.  She states that they were unable to determine where the bleeding came from.  Last colonoscopy in 1996.   She reports that she came to the ED today because she is not currently established with a PCP and call to make an appointment, but is unable to be seen until November 2019.  The patient's husband reports that she is extremely sedentary and spends most of her days on the couch.  She  typically only ambulates to and from the couch to the bathroom.  She has been advised by her previous PCP to increase her daily activity, but has been noncompliant.  The patient states I have a hard time walking because I feel off balance and over the last few falls my back pain has significantly increased.  She reports mild chronic weakness in her bilateral lower extremities, but denies numbness, headache, syncope, or urinary or fecal incontinence.  The history is provided by the patient. No language interpreter was used.    Past Medical History:  Diagnosis Date  . Abnormal involuntary movements(781.0)   . Alopecia, unspecified   . Anemia, unspecified   . Anxiety   . Bipolar disorder, unspecified ([place])   . Depression   . Dysuria   . Edema   . Encounter for long-term (current) use of other medications   . Esophageal reflux   . Fibromyalgia   . Hyperthyroidism   . Insomnia   . Lumbago   . Nonspecific elevation of levels of transaminase or lactic acid dehydrogenase (LDH)   . Other abnormal blood chemistry    hyperglycemia  . Other malaise and fatigue   . Pathologic fracture of vertebrae   . Reflux esophagitis   . Syncope and collapse   . Tachycardia, unspecified   . Unspecified essential hypertension   . Unspecified hypothyroidism  Patient Active Problem List   Diagnosis Date Noted  . Hematochezia 02/24/2018  . Compression fracture of T7 vertebra ([place]) 02/24/2018  . Tachycardia 02/24/2018  . Uncomplicated opioid dependence ([place]) 09/25/2017  . Symptomatic anemia 05/20/2017  . Nausea without vomiting 06/23/2016  . Corn 06/23/2016  . Callus 06/02/2014  . Hyponatremia 01/22/2014  . Depression   . Insomnia   . Fibromyalgia   . Anxiety   . Hypothyroidism   . Hyperglycemia   . Malaise   . Bipolar disorder ([place])   . Edema   . Essential hypertension     Past Surgical History:  Procedure Laterality Date  . CHOLECYSTECTOMY  03/2093   complicated by bile leak  . ERCP   11/03/2010   for bile leak  . LAPAROSCOPIC TUBAL LIGATION    . TONSILLECTOMY       OB History   None      Home Medications    Prior to Admission medications   Medication Sig Start Date End Date Taking? Authorizing Provider  acetaminophen (TYLENOL) 500 MG tablet Take 1,000 mg by mouth as needed for mild pain.   Yes [provider]  amitriptyline (ELAVIL) 25 MG tablet TAKE 1 TABLET(25 MG) BY MOUTH AT BEDTIME 01/31/18  Yes Reed, Tiffany L, DO  aspirin-acetaminophen-caffeine (EXCEDRIN MIGRAINE) [id number] MG tablet Take 2 tablets by mouth as needed for headache.   Yes [provider]  esomeprazole (NEXIUM 24HR) 20 MG capsule Take 20 mg by mouth daily as needed (for acid reflex).   Yes [provider]  FLUoxetine (PROZAC) 40 MG capsule TAKE 1 CAPSULE BY MOUTH DAILY TO HELP NERVES AND DEPRESSION 01/12/18  Yes Reed, Tiffany L, DO  levothyroxine (SYNTHROID, LEVOTHROID) 25 MCG tablet TAKE 1 TABLET BY MOUTH EVERY DAY 01/05/18  Yes Reed, Tiffany L, DO  Menthol-Methyl Salicylate (MUSCLE RUB) 10-15 % CREA Apply 1 application topically as needed for muscle pain.   Yes [provider]  Multiple Vitamin (MULTIVITAMIN WITH MINERALS) TABS tablet Take 1 tablet by mouth daily.   Yes [provider]  ondansetron (ZOFRAN) 4 MG tablet TAKE 1 TABLET BY MOUTH EVERY 8 HOURS AS NEEDED FOR NAUSEA 09/12/17  Yes Reed, Tiffany L, DO  pregabalin (LYRICA) 150 MG capsule Take 1 capsule (150 mg total) by mouth daily. 01/19/18  Yes Reed, Tiffany L, DO  risperiDONE (RISPERDAL) 1 MG tablet TAKE 1 TABLET BY MOUTH TWICE DAILY TO HELP RELAX 06/13/17  Yes Reed, Tiffany L, DO  senna (SENOKOT) 8.6 MG TABS tablet Take 1 tablet (8.6 mg total) by mouth daily as needed for mild constipation. 05/19/17  Yes Reed, Tiffany L, DO  meloxicam (MOBIC) 7.5 MG tablet Take 7.5 mg by mouth daily.    [provider]    Family History Family History  Problem Relation Age of Onset  . Cancer Father         prostate  . Colon cancer Neg Hx     Social History Social History   Tobacco Use  . Smoking status: Never Smoker  . Smokeless tobacco: Never Used  Substance Use Topics  . Alcohol use: No    Alcohol/week: 0.0 oz  . Drug use: No     Allergies   Fetzima [levomilnacipran]   Review of Systems Review of Systems   Physical Exam Updated Vital Signs BP (!) 149/86 (BP Location: Left Arm)   Pulse 100   Temp 99 F (37.2 C) (Oral)   Resp 16   Ht '5\' 4"'$  (1.626 m)  Wt 69.4 kg (153 lb)   SpO2 98%   BMI 26.26 kg/m   Physical Exam  Constitutional:  Non-toxic appearance. She does not appear ill. No distress.  HENT:  Head: Normocephalic.  Eyes: Pupils are equal, round, and reactive to light. Conjunctivae and EOM are normal.  Neck: Neck supple.  Cardiovascular: Normal rate, regular rhythm, normal heart sounds and intact distal pulses. Exam reveals no gallop and no friction rub.  No murmur heard. Pulmonary/Chest: Effort normal and breath sounds normal. No stridor. No respiratory distress. She has no wheezes. She has no rales. She exhibits no tenderness.  Abdominal: Soft. She exhibits no distension and no mass. There is tenderness. There is no rebound and no guarding. No hernia.  Diffusely tender to palpation throughout the abdomen without rebound or guarding.  Bilateral lower abdomen greater than bilateral upper abdomen.  Negative Murphy sign.  No tenderness over McBurney's point.  No CVA tenderness bilaterally.  Abdomen is soft, nondistended.  Genitourinary:  Genitourinary Comments: Chaperoned exam.  No external or internal hemorrhoids palpated.  Normal rectal exam.  No skin tears or fissures around the anus.  No erythema, edema, or warmth.  Soft brown stool noted.  No gross blood.  Musculoskeletal: She exhibits tenderness. She exhibits no edema or deformity.  Diffusely tender to palpation to the spinous processes of the cervical, thoracic, and lumbar spine and diffuse bilateral  paraspinal muscles.  No palpable crepitus, step-offs, or obvious deformities.  Decreased sensation with light touch mildly when comparing right to left.  5 out of 5 strength against resistance of bilateral lower extremities.  Patient is able to stand.  Antalgic gait.  Neurological: She is alert.  Skin: Skin is warm. Capillary refill takes less than 2 seconds. No rash noted.  Psychiatric: Her behavior is normal.  Nursing note and vitals reviewed.    ED Treatments / Results  Labs (all labs ordered are listed, but only abnormal results are displayed) Labs Reviewed  BASIC METABOLIC PANEL - Abnormal; Notable for the following components:      Result Value   Chloride 100 (*)    Creatinine, Ser 1.01 (*)    GFR calc non Af Amer 54 (*)    All other components within normal limits  CBC - Abnormal; Notable for the following components:   Hemoglobin 11.4 (*)    All other components within normal limits  HEPATIC FUNCTION PANEL - Abnormal; Notable for the following components:   Total Protein 6.2 (*)    Albumin 3.4 (*)    ALT 9 (*)    All other components within normal limits  POC OCCULT BLOOD, ED - Abnormal; Notable for the following components:   Fecal Occult Bld POSITIVE (*)    All other components within normal limits  SURGICAL PCR SCREEN  TSH  CBC  BASIC METABOLIC PANEL  CBC  I-STAT TROPONIN, ED    EKG EKG Interpretation  Date/Time:  Friday February 24 2018 11:16:08 EDT Ventricular Rate:  103 PR Interval:    QRS Duration: 136 QT Interval:  381 QTC Calculation: 499 R Axis:   -33 Text Interpretation:  Sinus tachycardia Left bundle branch block Confirmed by Dene Gentry [phone]) on 02/24/2018 12:47:03 PM   Radiology Dg Chest 2 View  Result Date: 02/24/2018 CLINICAL DATA:  Patient having syncope episodes. started this morning. EXAM: CHEST - 2 VIEW COMPARISON:  02/03/2018 and multiple additional exams including 10/31/2010 FINDINGS: There is a large hiatal hernia projecting at the  LEFT lung base. These retrocardiac  region is opacified in likely related to the large hiatal hernia. The RIGHT lung is clear. There is mild perihilar peribronchial thickening but no pulmonary edema. Bones appear radiolucent. There multiple wedge compression fractures and endplate fractures throughout the thoracic and visualized lumbar spine. IMPRESSION: 1.  No evidence for acute  abnormality. 2. Large hiatal hernia is stable. 3. Numerous wedge compression fractures in the thoracic and lumbar spine. 4. Recommend further evaluation with bone mineral density exam (DXA). Electronically Signed   By: Nolon Nations M.D.   On: 02/24/2018 12:05   Ct Head Wo Contrast  Result Date: 02/24/2018 CLINICAL DATA:  Pain following fall EXAM: CT HEAD WITHOUT CONTRAST CT CERVICAL SPINE WITHOUT CONTRAST TECHNIQUE: Multidetector CT imaging of the head and cervical spine was performed following the standard protocol without intravenous contrast. Multiplanar CT image reconstructions of the cervical spine were also generated. COMPARISON:  March 03, 2009 FINDINGS: CT HEAD FINDINGS Brain: There is stable moderate frontal and temporal lobe atrophy. There is mild atrophy elsewhere, stable. There is no intracranial mass, hemorrhage, extra-axial fluid collection, or midline shift. There is patchy small vessel disease in the centra semiovale bilaterally. Elsewhere gray-white compartments appear unremarkable. There is no evident acute infarct. Basal ganglia calcification bilaterally is felt to be physiologic in this age group. Vascular: There is no hyperdense vessel. There is calcification in each carotid siphon region. Skull: Bony calvarium appears intact. Sinuses/Orbits: There is a retention cyst in the inferior right maxillary antrum. There is opacification in the posterior right ethmoid air cells. Other paranasal sinuses are clear. There is rightward deviation of the nasal septum. Orbits appear symmetric bilaterally. Other: Mastoid air  cells are clear on the right. There is opacification in several peripheral mastoid air cells on the left, stable. CT CERVICAL SPINE FINDINGS Alignment: There is no appreciable spondylolisthesis. Skull base and vertebrae: Skull base and craniocervical junction regions appear normal. No evident fracture. There are no blastic or lytic bone lesions. Soft tissues and spinal canal: Prevertebral soft tissues and predental space regions are normal. There are no paraspinous lesions. There is no evident cord or canal hematoma. Disc levels: Disc spaces appear unremarkable. There is mild facet hypertrophy at several levels. No disc extrusion or stenosis evident. No nerve root edema or effacement. Upper chest: Visualized upper lung zones are clear. Other: There is calcification in each carotid artery. IMPRESSION: CT head: Stable areas of atrophy with patchy periventricular small vessel disease. No evident acute infarct. No mass or hemorrhage. There are foci of arterial vascular calcification. There is paranasal sinus disease at several sites as well as chronic left-sided mastoid disease. There is nasal septal deviation. CT cervical spine: No evident fracture or spondylolisthesis. There is mild osteoarthritic change at several levels. No nerve root edema or effacement. No disc extrusion or stenosis. There is carotid artery calcification bilaterally. Electronically Signed   By: Lowella Grip III M.D.   On: 02/24/2018 13:45   Ct Chest W Contrast  Result Date: 02/24/2018 CLINICAL DATA:  Fall 2 days ago. EXAM: CT CHEST, ABDOMEN, AND PELVIS WITH CONTRAST TECHNIQUE: Multidetector CT imaging of the chest, abdomen and pelvis was performed following the standard protocol during bolus administration of intravenous contrast. CONTRAST:  139m OMNIPAQUE IOHEXOL 300 MG/ML  SOLN COMPARISON:  Chest x-ray from same day. CT abdomen pelvis dated November 07, 2010. FINDINGS: CT CHEST FINDINGS Cardiovascular: Normal heart size. No pericardial  effusion. Normal caliber thoracic aorta. No aortic dissection. Aortic arch atherosclerosis. No central pulmonary embolism. Mediastinum/Nodes: No enlarged  mediastinal, hilar, or axillary lymph nodes. Thyroid gland, trachea, and esophagus demonstrate no significant findings. Unchanged large hiatal hernia. Lungs/Pleura: No focal consolidation, pleural effusion, or pneumothorax. 6 x 5 mm nodule in the left lower lobe. Minimal bilateral lower lobe subsegmental atelectasis. Scarring in the lingula. Musculoskeletal: Acute to subacute mild T7 and moderate T8 compression deformities with small amount of paravertebral hematoma. Unchanged chronic mild superior endplate compression deformities of T10, T11, and T12. No sternal or rib fracture. CT ABDOMEN PELVIS FINDINGS Hepatobiliary: Scattered subcentimeter low-density lesions in the liver are too small to characterize. Status post cholecystectomy. No biliary dilatation. Pancreas: Unremarkable. No pancreatic ductal dilatation or surrounding inflammatory changes. Spleen: Normal in size without focal abnormality. Adrenals/Urinary Tract: The adrenal glands are unremarkable. 1.4 cm simple cyst in the right kidney. Interval enlargement of the simple cyst in the left kidney, now measuring 5.1 cm. No renal or ureteral calculi. No hydronephrosis. The bladder is unremarkable. Stomach/Bowel: Large hiatal hernia containing stomach and the splenic flexure. No evidence of bowel wall thickening, distention, or surrounding inflammatory changes. Normal appendix. Vascular/Lymphatic: Mild aortic atherosclerosis. No enlarged abdominal or pelvic lymph nodes. Reproductive: Uterus and bilateral adnexa are unremarkable. Other: No free fluid or pneumoperitoneum. Musculoskeletal: No acute or significant osseous findings. Unchanged chronic moderate compression fracture of L2. IMPRESSION: 1. Acute to subacute mild T7 and moderate T8 compression fractures. 2. No other evidence of traumatic injury within  the chest, abdomen, or pelvis. 3. 6 mm nodule in the left lower lobe. Non-contrast chest CT at 6-12 months is recommended. If the nodule is stable at time of repeat CT, then future CT at 18-24 months (from today's scan) is considered optional for low-risk patients, but is recommended for high-risk patients. This recommendation follows the consensus statement: Guidelines for Management of Incidental Pulmonary Nodules Detected on CT Images: From the Fleischner Society 2017; Radiology 2017; 284:228-243. 4. Unchanged large hiatal hernia containing stomach and the splenic flexure. 5. Chronic compression fractures of T10 through T12 and L2. 6.  Aortic atherosclerosis (ICD10-I70.0). Electronically Signed   By: Titus Dubin M.D.   On: 02/24/2018 14:04   Ct Cervical Spine Wo Contrast  Result Date: 02/24/2018 CLINICAL DATA:  Pain following fall EXAM: CT HEAD WITHOUT CONTRAST CT CERVICAL SPINE WITHOUT CONTRAST TECHNIQUE: Multidetector CT imaging of the head and cervical spine was performed following the standard protocol without intravenous contrast. Multiplanar CT image reconstructions of the cervical spine were also generated. COMPARISON:  March 03, 2009 FINDINGS: CT HEAD FINDINGS Brain: There is stable moderate frontal and temporal lobe atrophy. There is mild atrophy elsewhere, stable. There is no intracranial mass, hemorrhage, extra-axial fluid collection, or midline shift. There is patchy small vessel disease in the centra semiovale bilaterally. Elsewhere gray-white compartments appear unremarkable. There is no evident acute infarct. Basal ganglia calcification bilaterally is felt to be physiologic in this age group. Vascular: There is no hyperdense vessel. There is calcification in each carotid siphon region. Skull: Bony calvarium appears intact. Sinuses/Orbits: There is a retention cyst in the inferior right maxillary antrum. There is opacification in the posterior right ethmoid air cells. Other paranasal sinuses  are clear. There is rightward deviation of the nasal septum. Orbits appear symmetric bilaterally. Other: Mastoid air cells are clear on the right. There is opacification in several peripheral mastoid air cells on the left, stable. CT CERVICAL SPINE FINDINGS Alignment: There is no appreciable spondylolisthesis. Skull base and vertebrae: Skull base and craniocervical junction regions appear normal. No evident fracture. There are no blastic  or lytic bone lesions. Soft tissues and spinal canal: Prevertebral soft tissues and predental space regions are normal. There are no paraspinous lesions. There is no evident cord or canal hematoma. Disc levels: Disc spaces appear unremarkable. There is mild facet hypertrophy at several levels. No disc extrusion or stenosis evident. No nerve root edema or effacement. Upper chest: Visualized upper lung zones are clear. Other: There is calcification in each carotid artery. IMPRESSION: CT head: Stable areas of atrophy with patchy periventricular small vessel disease. No evident acute infarct. No mass or hemorrhage. There are foci of arterial vascular calcification. There is paranasal sinus disease at several sites as well as chronic left-sided mastoid disease. There is nasal septal deviation. CT cervical spine: No evident fracture or spondylolisthesis. There is mild osteoarthritic change at several levels. No nerve root edema or effacement. No disc extrusion or stenosis. There is carotid artery calcification bilaterally. Electronically Signed   By: Lowella Grip III M.D.   On: 02/24/2018 13:45   Ct Abdomen Pelvis W Contrast  Result Date: 02/24/2018 CLINICAL DATA:  Fall 2 days ago. EXAM: CT CHEST, ABDOMEN, AND PELVIS WITH CONTRAST TECHNIQUE: Multidetector CT imaging of the chest, abdomen and pelvis was performed following the standard protocol during bolus administration of intravenous contrast. CONTRAST:  142m OMNIPAQUE IOHEXOL 300 MG/ML  SOLN COMPARISON:  Chest x-ray from same  day. CT abdomen pelvis dated November 07, 2010. FINDINGS: CT CHEST FINDINGS Cardiovascular: Normal heart size. No pericardial effusion. Normal caliber thoracic aorta. No aortic dissection. Aortic arch atherosclerosis. No central pulmonary embolism. Mediastinum/Nodes: No enlarged mediastinal, hilar, or axillary lymph nodes. Thyroid gland, trachea, and esophagus demonstrate no significant findings. Unchanged large hiatal hernia. Lungs/Pleura: No focal consolidation, pleural effusion, or pneumothorax. 6 x 5 mm nodule in the left lower lobe. Minimal bilateral lower lobe subsegmental atelectasis. Scarring in the lingula. Musculoskeletal: Acute to subacute mild T7 and moderate T8 compression deformities with small amount of paravertebral hematoma. Unchanged chronic mild superior endplate compression deformities of T10, T11, and T12. No sternal or rib fracture. CT ABDOMEN PELVIS FINDINGS Hepatobiliary: Scattered subcentimeter low-density lesions in the liver are too small to characterize. Status post cholecystectomy. No biliary dilatation. Pancreas: Unremarkable. No pancreatic ductal dilatation or surrounding inflammatory changes. Spleen: Normal in size without focal abnormality. Adrenals/Urinary Tract: The adrenal glands are unremarkable. 1.4 cm simple cyst in the right kidney. Interval enlargement of the simple cyst in the left kidney, now measuring 5.1 cm. No renal or ureteral calculi. No hydronephrosis. The bladder is unremarkable. Stomach/Bowel: Large hiatal hernia containing stomach and the splenic flexure. No evidence of bowel wall thickening, distention, or surrounding inflammatory changes. Normal appendix. Vascular/Lymphatic: Mild aortic atherosclerosis. No enlarged abdominal or pelvic lymph nodes. Reproductive: Uterus and bilateral adnexa are unremarkable. Other: No free fluid or pneumoperitoneum. Musculoskeletal: No acute or significant osseous findings. Unchanged chronic moderate compression fracture of L2.  IMPRESSION: 1. Acute to subacute mild T7 and moderate T8 compression fractures. 2. No other evidence of traumatic injury within the chest, abdomen, or pelvis. 3. 6 mm nodule in the left lower lobe. Non-contrast chest CT at 6-12 months is recommended. If the nodule is stable at time of repeat CT, then future CT at 18-24 months (from today's scan) is considered optional for low-risk patients, but is recommended for high-risk patients. This recommendation follows the consensus statement: Guidelines for Management of Incidental Pulmonary Nodules Detected on CT Images: From the Fleischner Society 2017; Radiology 2017; 284:228-243. 4. Unchanged large hiatal hernia containing stomach and the splenic  flexure. 5. Chronic compression fractures of T10 through T12 and L2. 6.  Aortic atherosclerosis (ICD10-I70.0). Electronically Signed   By: Titus Dubin M.D.   On: 02/24/2018 14:04   Ct T-spine No Charge  Result Date: 02/24/2018 CLINICAL DATA:  Fall 2 days ago. EXAM: CT THORACIC AND LUMBAR SPINE WITHOUT CONTRAST TECHNIQUE: Multidetector CT imaging of the thoracic and lumbar spine was performed without intravenous contrast. Multiplanar CT image reconstructions were also generated. COMPARISON:  CT abdomen pelvis dated November 07, 2010. FINDINGS: CT THORACIC SPINE FINDINGS Alignment: Mild dextroscoliosis, apex at T7-T8. Sagittal alignment is maintained. Vertebrae: Acute to subacute mild T7 and moderate T8 compression fractures with a small amount of paravertebral hematoma. No retropulsion. Chronic mild superior endplate compression fractures of T10 through T12. Osteopenia. Paraspinal and other soft tissues: Please see separate CT chest report from same day. Disc levels: Disc heights are relatively preserved. No significant spinal canal or neuroforaminal stenosis. CT LUMBAR SPINE FINDINGS Segmentation: 5 lumbar type vertebrae. Alignment: Normal. Vertebrae: No acute fracture. Chronic moderate superior endplate compression  fracture of L2 with 2-3 mm retropulsion. Osteopenia. Paraspinal and other soft tissues: Please see separate CT abdomen and pelvis report from same day. Disc levels: Disc heights are relatively preserved. No significant spinal canal or neuroforaminal stenosis. IMPRESSION: 1. Acute to subacute mild T7 and moderate T8 compression fractures. 2. Chronic compression fractures of T10 through T12 and L2. Minimal retropulsion at L2 is unchanged. Electronically Signed   By: Titus Dubin M.D.   On: 02/24/2018 14:13   Ct L-spine No Charge  Result Date: 02/24/2018 CLINICAL DATA:  Fall 2 days ago. EXAM: CT THORACIC AND LUMBAR SPINE WITHOUT CONTRAST TECHNIQUE: Multidetector CT imaging of the thoracic and lumbar spine was performed without intravenous contrast. Multiplanar CT image reconstructions were also generated. COMPARISON:  CT abdomen pelvis dated November 07, 2010. FINDINGS: CT THORACIC SPINE FINDINGS Alignment: Mild dextroscoliosis, apex at T7-T8. Sagittal alignment is maintained. Vertebrae: Acute to subacute mild T7 and moderate T8 compression fractures with a small amount of paravertebral hematoma. No retropulsion. Chronic mild superior endplate compression fractures of T10 through T12. Osteopenia. Paraspinal and other soft tissues: Please see separate CT chest report from same day. Disc levels: Disc heights are relatively preserved. No significant spinal canal or neuroforaminal stenosis. CT LUMBAR SPINE FINDINGS Segmentation: 5 lumbar type vertebrae. Alignment: Normal. Vertebrae: No acute fracture. Chronic moderate superior endplate compression fracture of L2 with 2-3 mm retropulsion. Osteopenia. Paraspinal and other soft tissues: Please see separate CT abdomen and pelvis report from same day. Disc levels: Disc heights are relatively preserved. No significant spinal canal or neuroforaminal stenosis. IMPRESSION: 1. Acute to subacute mild T7 and moderate T8 compression fractures. 2. Chronic compression fractures of T10  through T12 and L2. Minimal retropulsion at L2 is unchanged. Electronically Signed   By: Titus Dubin M.D.   On: 02/24/2018 14:13    Procedures Procedures (including critical care time)  Medications Ordered in ED Medications  amitriptyline (ELAVIL) tablet 25 mg (has no administration in time range)  pantoprazole (PROTONIX) EC tablet 40 mg (has no administration in time range)  FLUoxetine (PROZAC) capsule 40 mg (has no administration in time range)  levothyroxine (SYNTHROID, LEVOTHROID) tablet 25 mcg (has no administration in time range)  pregabalin (LYRICA) capsule 150 mg (has no administration in time range)  senna (SENOKOT) tablet 8.6 mg (has no administration in time range)  0.9 %  sodium chloride infusion (has no administration in time range)  ondansetron (ZOFRAN) tablet 4 mg (  has no administration in time range)    Or  ondansetron (ZOFRAN) injection 4 mg (has no administration in time range)  peg 3350 powder (MOVIPREP) kit 200 g (has no administration in time range)  metoCLOPramide (REGLAN) tablet 10 mg (has no administration in time range)    Followed by  metoCLOPramide (REGLAN) tablet 10 mg (has no administration in time range)  oxyCODONE-acetaminophen (PERCOCET/ROXICET) 5-325 MG per tablet 1 tablet (has no administration in time range)  risperiDONE (RISPERDAL) tablet 1 mg (has no administration in time range)  sodium chloride 0.9 % bolus 500 mL (has no administration in time range)  metoprolol tartrate (LOPRESSOR) injection 5 mg (has no administration in time range)  mupirocin ointment (BACTROBAN) 2 % 1 application (has no administration in time range)  iohexol (OMNIPAQUE) 300 MG/ML solution 100 mL (100 mLs Intravenous Contrast Given 02/24/18 1306)     Initial Impression / Assessment and Plan / ED Course  I have reviewed the triage vital signs and the nursing notes.  Pertinent labs & imaging results that were available during my care of the patient were reviewed by me and  considered in my medical decision making (see chart for details).    72 year old female with a history of thyroid disease, anemia, involuntary leg movements who presents to the emergency department with a chief complaint of hematochezia.  He also records recurrent falls, 2 falls within the last 2 weeks.  Earlier this week, she  fell backwards from standing onto carpeted ground after she began having involuntary jerking of the left leg.  She reports this is been ongoing for some time, but reports that it is not been worked up.  She has recently changed PCPs but is unable to get an appointment until November with her new PCP.  On exam, the patient is diffusely tender throughout the entire back.  She reports that she had a hematoma to the posterior scalp last week after she fell and hit her head, which is since resolved.  She is not anticoagulated.  She has declined pain control since arrival in the ED.  Nursing staff also reports that on reevaluation the patient was sitting and talking and her heart rate increased from the 80s to the 110s.  EKG captured heart rate of 110 with left bundle branch block, which was demonstrated on previous EKG.  Chest x-ray and chest CT were unremarkable.  Patient was also observed in the room and had intermittent increases of increasing heart rate that would then resolve spontaneously.  She denies any chest pain or dyspnea with associated symptoms at this time. Troponin is negative.  Multiple wedge fractures are seen on CT.  No obvious source for hematochezia.  Patient reports that she has not had a colonoscopy since 1996.  She is not established with GI.  Consulted GI and spoke with PA Judson Roch who will come and evaluate the patient in the ED.  Spoke with Luane School with the hospitalist team who will accept the patient for admission.  The patient appears reasonably stabilized for admission considering the current resources, flow, and capabilities available in the ED at this  time, and I doubt any other [facility] requiring further screening and/or treatment in the ED prior to admission.  Final Clinical Impressions(s) / ED Diagnoses   Final diagnoses:  Fall    ED Discharge Orders    None       Joanne Gavel, PA-C 02/24/18 1704    Valarie Merino, MD 02/26/18 [phone]

## 2018-02-24 NOTE — Consult Note (Addendum)
[facility] Consult: 3:08 PM 02/24/2018  LOS: 0 days    Referring Provider: Dr Rodena Medin in ED  Primary Care Physician:  Bufford Spikes of [facility]  >> will be establishing with Dr. Rodrigo Ran within the next several weeks Primary Gastroenterologist:   Leone Payor for ERCP   Reason for Consultation:  Bleeding PR.     HPI: Sarah Miranda is a 72 y.o. female.  Hx fibromyalgia.  Anxiety, bipolar d/o.  Hypothyroidism. HTN.  Chronic pain.    Microcytic anemia 05/2017, Hgb 3.8 to 7.6 after 3 U PRBCs, MCV 68.  Discharged on oral iron, contd on Nexium.     Post cholecystectomy bile leak.  Complicated by VDRF.   10/2010 ERCP with stent placement.  HH noted.   01/2011 ERCP with stent removal.   He says she is colonoscopy remotely but it at least 15 years.  There is no record confirming colonoscopy in Epic  Patient followed up regarding her anemia with her PMD.  There was talk a referral back to GI but it never happened.  Patient opted for fecal occult blood testing and completed cards at home.  These cards were FOBT negative. For 5 weeks ago, her than primary MD, stopped her oxycodone.  She takes Mobic daily but to deal with her fibromyalgia pain the patient started using Excedrin daily, on average 5 tablets a day.  She has had intermittent nausea despite taking the Nexium daily, this is generally stable other than earlier this week.  PRN Zofran prescribed and helped.  Denies dysphagia, heartburn, weight loss.  Monday, 5 days ago, and Tuesday she had a acute gastrointestinal illness with nausea but no emesis.  Diarrhea of brown watery stool but she saw blood mixed in.  By Wednesday she was feeling better and stools were formed but she still saw a bit of blood.  Saw some blood yesterday as well.  No recurrent nausea.  Has pain bilaterally  in her lower abdomen of moderate severity. About 10 days ago patient fell backwards and hit the back of her head sustaining a hematoma but had no neurologic symptoms, no bleeding.  On Monday she fell and sustained injury to her back and worsening of her chronic back pain Because of the pain in the recent bleeding stools and her generally not feeling well she presented to the ED today. Hgb 11.4.  Was 12.7 on 01/19/18.  FOBT negative.   LFTs normal.    CT with contrast.  Abdomen, pelvis, chest.  Scattered subcentimeter low-density hepatic lesions, TSTC.  Acute and subacute compression fxs in T spine.  6 mm nodule in left lower lobe.  Large HH contains stomach and splenic gflexure.   Head CT: stable atrophy and small vsl dz.  No c spine fractures.  bil carotid stenosis.  Recommendation is to repeat the chest CT for eval of the lung nodule 6 to 12 months.    She does not drink alcoholic beverages.  There is no family history of ulcer disease, anemia, GI bleeds, colon cancer.   Past Medical History:  Diagnosis Date  . Abnormal involuntary movements(781.0)   . Alopecia, unspecified   . Anemia, unspecified   . Anxiety   . Bipolar disorder, unspecified (HCC)   . Depression   . Dysuria   . Edema   . Encounter for long-term (current) use of other medications   . Esophageal reflux   . Fibromyalgia   . Hyperthyroidism   . Insomnia   . Lumbago   . Nonspecific elevation of levels of transaminase or lactic acid dehydrogenase (LDH)   . Other abnormal blood chemistry    hyperglycemia  . Other malaise and fatigue   . Pathologic fracture of vertebrae   . Reflux esophagitis   . Syncope and collapse   . Tachycardia, unspecified   . Unspecified essential hypertension   . Unspecified hypothyroidism     Past Surgical History:  Procedure Laterality Date  . CHOLECYSTECTOMY  10/2010   complicated by bile leak  . ERCP  11/03/2010   for bile leak  . LAPAROSCOPIC TUBAL LIGATION    . TONSILLECTOMY       Prior to Admission medications   Medication Sig Start Date End Date Taking? Authorizing Provider  amitriptyline (ELAVIL) 25 MG tablet TAKE 1 TABLET(25 MG) BY MOUTH AT BEDTIME 01/31/18   Reed, Tiffany L, DO  esomeprazole (NEXIUM 24HR) 20 MG capsule Take 20 mg by mouth daily as needed (for acid reflex).    [provider]  FLUoxetine (PROZAC) 40 MG capsule TAKE 1 CAPSULE BY MOUTH DAILY TO HELP NERVES AND DEPRESSION 01/12/18   Reed, Tiffany L, DO  levothyroxine (SYNTHROID, LEVOTHROID) 25 MCG tablet TAKE 1 TABLET BY MOUTH EVERY DAY 01/05/18   Reed, Tiffany L, DO  meloxicam (MOBIC) 7.5 MG tablet Take 7.5 mg by mouth daily.    [provider]  Multiple Vitamin (MULTIVITAMIN WITH MINERALS) TABS tablet Take 1 tablet by mouth daily.    [provider]  ondansetron (ZOFRAN) 4 MG tablet TAKE 1 TABLET BY MOUTH EVERY 8 HOURS AS NEEDED FOR NAUSEA 09/12/17   Reed, Tiffany L, DO  pregabalin (LYRICA) 150 MG capsule Take 1 capsule (150 mg total) by mouth daily. 01/19/18   Reed, Tiffany L, DO  risperiDONE (RISPERDAL) 1 MG tablet TAKE 1 TABLET BY MOUTH TWICE DAILY TO HELP RELAX 06/13/17   Reed, Tiffany L, DO  senna (SENOKOT) 8.6 MG TABS tablet Take 1 tablet (8.6 mg total) by mouth daily as needed for mild constipation. 05/19/17   Reed, Tiffany L, DO    Scheduled Meds:  Infusions:  PRN Meds:    Allergies as of 02/24/2018 - Review Complete 02/24/2018  Allergen Reaction Noted  . Fetzima [levomilnacipran] Nausea Only 01/17/2017    Family History  Problem Relation Age of Onset  . Cancer Father        prostate  . Colon cancer Neg Hx     Social History   Socioeconomic History  . Marital status: Married    Spouse name: Not on file  . Number of children: 2  . Years of education: Not on file  . Highest education level: Not on file  Occupational History  . Occupation: retired  Engineer, production  . Financial resource strain: Not hard at all  . Food insecurity:    Worry: Never true     Inability: Never true  . Transportation needs:    Medical: No    Non-medical: No  Tobacco Use  . Smoking status: Never Smoker  . Smokeless tobacco: Never Used  Substance and  Sexual Activity  . Alcohol use: No    Alcohol/week: 0.0 oz  . Drug use: No  . Sexual activity: Never  Lifestyle  . Physical activity:    Days per week: 0 days    Minutes per session: 0 min  . Stress: To some extent  Relationships  . Social connections:    Talks on phone: Three times a week    Gets together: Once a week    Attends religious service: More than 4 times per year    Active member of club or organization: No    Attends meetings of clubs or organizations: Never    Relationship status: Married  . Intimate partner violence:    Fear of current or ex partner: No    Emotionally abused: No    Physically abused: No    Forced sexual activity: No  Other Topics Concern  . Not on file  Social History Narrative  . Not on file    REVIEW OF SYSTEMS: Constitutional: Weakness, fatigue somewhat chronic but worse in the last few days.  Generally sedentary, spends most of her day sitting on the couch activity involves moving from the couch to the bathroom. ENT:  No nose bleeds Pulm: Breathing.  Cough. CV:  No palpitations, no LE edema.  Chest pain. GU:  No hematuria, no frequency GI:  Per HPI Heme: No unusual bleeding or bruising Transfusions:  Per HPI Neuro: Occasional involuntary leg movement.  Balance problems and unsteady gait.  Feels weak in her legs.  No headaches, no peripheral tingling or numbness Derm:  No itching, no rash or sores.  Endocrine:  No sweats or chills.  No polyuria or dysuria Immunization: Not coronary. Travel:  None beyond local counties in last few months.    PHYSICAL EXAM: Vital signs in last 24 hours: Vitals:   02/24/18 1430 02/24/18 1445  BP:  (!) 145/101  Pulse:    Resp: 19 14  Temp:    SpO2:     Wt Readings from Last 3 Encounters:  02/24/18 153 lb (69.4 kg)   02/07/18 158 lb 12.8 oz (72 kg)  01/19/18 158 lb (71.7 kg)    General: Mildly chronically but not acutely ill-appearing WF.  Alert. Head: No facial asymmetry or swelling.  No signs of head trauma. Eyes: No scleral icterus.  No conjunctival pallor.  EOMI. Ears: Hard of hearing. Nose: No discharge or congestion. Mouth: Oropharynx moist, clear, pink. Neck: No JVD, no masses, no thyromegaly. Lungs: Clear bilaterally.  No cough or labored breathing. Heart: RRR.  No MRG.  S1, S2 present. Abdomen: Soft.  Slight tenderness in the lower abdomen more so on the left.  No HSM, masses, bruits.  Bowel sounds active..   Rectal: No masses.  No stool, no blood.  No visible or palpable hemorrhoids Musc/Skeltl: No joint redness or swelling.  No significant contractures or deformity Extremities: No CCE. Neurologic: Alert.  Oriented x3.  Moves all 4 limbs.  No tremors.  Limb strength not tested Skin: No significant bruising, no cuts Tattoos: None Nodes: No cervical Psych: Pleasant, minimally anxious  Intake/Output from previous day: No intake/output data recorded. Intake/Output this shift: No intake/output data recorded.  LAB RESULTS: Recent Labs    02/24/18 1128  WBC 7.8  HGB 11.4*  HCT 37.8  PLT 280   BMET Lab Results  Component Value Date   NA 136 02/24/2018   NA 138 01/19/2018   NA 135 09/15/2017   K 4.1 02/24/2018   K  4.0 01/19/2018   K 4.4 09/15/2017   CL 100 (L) 02/24/2018   CL 101 01/19/2018   CL 99 09/15/2017   CO2 26 02/24/2018   CO2 30 01/19/2018   CO2 30 09/15/2017   GLUCOSE 97 02/24/2018   GLUCOSE 89 01/19/2018   GLUCOSE 105 (H) 09/15/2017   BUN 16 02/24/2018   BUN 13 01/19/2018   BUN 9 09/15/2017   CREATININE 1.01 (H) 02/24/2018   CREATININE 0.93 01/19/2018   CREATININE 0.76 09/15/2017   CALCIUM 8.9 02/24/2018   CALCIUM 9.3 01/19/2018   CALCIUM 8.3 (L) 09/15/2017   LFT Recent Labs    02/24/18 1128  PROT 6.2*  ALBUMIN 3.4*  AST 15  ALT 9*  ALKPHOS 72   BILITOT 0.6  BILIDIR 0.1  IBILI 0.5   PT/INR Lab Results  Component Value Date   INR 1.36 11/02/2010   Hepatitis Panel No results for input(s): HEPBSAG, HCVAB, HEPAIGM, HEPBIGM in the last 72 hours. C-Diff No components found for: CDIFF Lipase     Component Value Date/Time   LIPASE 10 (L) 11/04/2010 0430    Drugs of Abuse  No results found for: LABOPIA, COCAINSCRNUR, LABBENZ, AMPHETMU, THCU, LABBARB   RADIOLOGY STUDIES: Dg Chest 2 View  Result Date: 02/24/2018 CLINICAL DATA:  Patient having syncope episodes. started this morning. EXAM: CHEST - 2 VIEW COMPARISON:  02/03/2018 and multiple additional exams including 10/31/2010 FINDINGS: There is a large hiatal hernia projecting at the LEFT lung base. These retrocardiac region is opacified in likely related to the large hiatal hernia. The RIGHT lung is clear. There is mild perihilar peribronchial thickening but no pulmonary edema. Bones appear radiolucent. There multiple wedge compression fractures and endplate fractures throughout the thoracic and visualized lumbar spine. IMPRESSION: 1.  No evidence for acute  abnormality. 2. Large hiatal hernia is stable. 3. Numerous wedge compression fractures in the thoracic and lumbar spine. 4. Recommend further evaluation with bone mineral density exam (DXA). Electronically Signed   By: Norva Pavlov M.D.   On: 02/24/2018 12:05   Ct Head Wo Contrast  Result Date: 02/24/2018 CLINICAL DATA:  Pain following fall EXAM: CT HEAD WITHOUT CONTRAST CT CERVICAL SPINE WITHOUT CONTRAST TECHNIQUE: Multidetector CT imaging of the head and cervical spine was performed following the standard protocol without intravenous contrast. Multiplanar CT image reconstructions of the cervical spine were also generated. COMPARISON:  March 03, 2009 FINDINGS: CT HEAD FINDINGS Brain: There is stable moderate frontal and temporal lobe atrophy. There is mild atrophy elsewhere, stable. There is no intracranial mass, hemorrhage,  extra-axial fluid collection, or midline shift. There is patchy small vessel disease in the centra semiovale bilaterally. Elsewhere gray-white compartments appear unremarkable. There is no evident acute infarct. Basal ganglia calcification bilaterally is felt to be physiologic in this age group. Vascular: There is no hyperdense vessel. There is calcification in each carotid siphon region. Skull: Bony calvarium appears intact. Sinuses/Orbits: There is a retention cyst in the inferior right maxillary antrum. There is opacification in the posterior right ethmoid air cells. Other paranasal sinuses are clear. There is rightward deviation of the nasal septum. Orbits appear symmetric bilaterally. Other: Mastoid air cells are clear on the right. There is opacification in several peripheral mastoid air cells on the left, stable. CT CERVICAL SPINE FINDINGS Alignment: There is no appreciable spondylolisthesis. Skull base and vertebrae: Skull base and craniocervical junction regions appear normal. No evident fracture. There are no blastic or lytic bone lesions. Soft tissues and spinal canal: Prevertebral soft tissues  and predental space regions are normal. There are no paraspinous lesions. There is no evident cord or canal hematoma. Disc levels: Disc spaces appear unremarkable. There is mild facet hypertrophy at several levels. No disc extrusion or stenosis evident. No nerve root edema or effacement. Upper chest: Visualized upper lung zones are clear. Other: There is calcification in each carotid artery. IMPRESSION: CT head: Stable areas of atrophy with patchy periventricular small vessel disease. No evident acute infarct. No mass or hemorrhage. There are foci of arterial vascular calcification. There is paranasal sinus disease at several sites as well as chronic left-sided mastoid disease. There is nasal septal deviation. CT cervical spine: No evident fracture or spondylolisthesis. There is mild osteoarthritic change at  several levels. No nerve root edema or effacement. No disc extrusion or stenosis. There is carotid artery calcification bilaterally. Electronically Signed   By: Bretta Bang III M.D.   On: 02/24/2018 13:45   Ct Chest W Contrast  Result Date: 02/24/2018 CLINICAL DATA:  Fall 2 days ago. EXAM: CT CHEST, ABDOMEN, AND PELVIS WITH CONTRAST TECHNIQUE: Multidetector CT imaging of the chest, abdomen and pelvis was performed following the standard protocol during bolus administration of intravenous contrast. CONTRAST:  OMNIPAQUE IOHEXOL 300 MG/ML  SOLN COMPARISON:  Chest x-ray from same day. CT abdomen pelvis dated November 07, 2010. FINDINGS: CT CHEST FINDINGS Cardiovascular: Normal heart size. No pericardial effusion. Normal caliber thoracic aorta. No aortic dissection. Aortic arch atherosclerosis. No central pulmonary embolism. Mediastinum/Nodes: No enlarged mediastinal, hilar, or axillary lymph nodes. Thyroid gland, trachea, and esophagus demonstrate no significant findings. Unchanged large hiatal hernia. Lungs/Pleura: No focal consolidation, pleural effusion, or pneumothorax. 6 x 5 mm nodule in the left lower lobe. Minimal bilateral lower lobe subsegmental atelectasis. Scarring in the lingula. Musculoskeletal: Acute to subacute mild T7 and moderate T8 compression deformities with small amount of paravertebral hematoma. Unchanged chronic mild superior endplate compression deformities of T10, T11, and T12. No sternal or rib fracture. CT ABDOMEN PELVIS FINDINGS Hepatobiliary: Scattered subcentimeter low-density lesions in the liver are too small to characterize. Status post cholecystectomy. No biliary dilatation. Pancreas: Unremarkable. No pancreatic ductal dilatation or surrounding inflammatory changes. Spleen: Normal in size without focal abnormality. Adrenals/Urinary Tract: The adrenal glands are unremarkable. 1.4 cm simple cyst in the right kidney. Interval enlargement of the simple cyst in the left kidney,  now measuring 5.1 cm. No renal or ureteral calculi. No hydronephrosis. The bladder is unremarkable. Stomach/Bowel: Large hiatal hernia containing stomach and the splenic flexure. No evidence of bowel wall thickening, distention, or surrounding inflammatory changes. Normal appendix. Vascular/Lymphatic: Mild aortic atherosclerosis. No enlarged abdominal or pelvic lymph nodes. Reproductive: Uterus and bilateral adnexa are unremarkable. Other: No free fluid or pneumoperitoneum. Musculoskeletal: No acute or significant osseous findings. Unchanged chronic moderate compression fracture of L2. IMPRESSION: 1. Acute to subacute mild T7 and moderate T8 compression fractures. 2. No other evidence of traumatic injury within the chest, abdomen, or pelvis. 3. 6 mm nodule in the left lower lobe. Non-contrast chest CT at 6-12 months is recommended. If the nodule is stable at time of repeat CT, then future CT at 18-24 months (from today's scan) is considered optional for low-risk patients, but is recommended for high-risk patients. This recommendation follows the consensus statement: Guidelines for Management of Incidental Pulmonary Nodules Detected on CT Images: From the Fleischner Society 2017; Radiology 2017; 284:228-243. 4. Unchanged large hiatal hernia containing stomach and the splenic flexure. 5. Chronic compression fractures of T10 through T12 and L2. 6.  Aortic atherosclerosis (ICD10-I70.0). Electronically Signed   By: Obie DredgeWilliam T Derry M.D.   On: 02/24/2018 14:04   Ct Cervical Spine Wo Contrast  Result Date: 02/24/2018 CLINICAL DATA:  Pain following fall EXAM: CT HEAD WITHOUT CONTRAST CT CERVICAL SPINE WITHOUT CONTRAST TECHNIQUE: Multidetector CT imaging of the head and cervical spine was performed following the standard protocol without intravenous contrast. Multiplanar CT image reconstructions of the cervical spine were also generated. COMPARISON:  March 03, 2009 FINDINGS: CT HEAD FINDINGS Brain: There is stable moderate  frontal and temporal lobe atrophy. There is mild atrophy elsewhere, stable. There is no intracranial mass, hemorrhage, extra-axial fluid collection, or midline shift. There is patchy small vessel disease in the centra semiovale bilaterally. Elsewhere gray-white compartments appear unremarkable. There is no evident acute infarct. Basal ganglia calcification bilaterally is felt to be physiologic in this age group. Vascular: There is no hyperdense vessel. There is calcification in each carotid siphon region. Skull: Bony calvarium appears intact. Sinuses/Orbits: There is a retention cyst in the inferior right maxillary antrum. There is opacification in the posterior right ethmoid air cells. Other paranasal sinuses are clear. There is rightward deviation of the nasal septum. Orbits appear symmetric bilaterally. Other: Mastoid air cells are clear on the right. There is opacification in several peripheral mastoid air cells on the left, stable. CT CERVICAL SPINE FINDINGS Alignment: There is no appreciable spondylolisthesis. Skull base and vertebrae: Skull base and craniocervical junction regions appear normal. No evident fracture. There are no blastic or lytic bone lesions. Soft tissues and spinal canal: Prevertebral soft tissues and predental space regions are normal. There are no paraspinous lesions. There is no evident cord or canal hematoma. Disc levels: Disc spaces appear unremarkable. There is mild facet hypertrophy at several levels. No disc extrusion or stenosis evident. No nerve root edema or effacement. Upper chest: Visualized upper lung zones are clear. Other: There is calcification in each carotid artery. IMPRESSION: CT head: Stable areas of atrophy with patchy periventricular small vessel disease. No evident acute infarct. No mass or hemorrhage. There are foci of arterial vascular calcification. There is paranasal sinus disease at several sites as well as chronic left-sided mastoid disease. There is nasal  septal deviation. CT cervical spine: No evident fracture or spondylolisthesis. There is mild osteoarthritic change at several levels. No nerve root edema or effacement. No disc extrusion or stenosis. There is carotid artery calcification bilaterally. Electronically Signed   By: Bretta BangWilliam  Woodruff III M.D.   On: 02/24/2018 13:45   Ct Abdomen Pelvis W Contrast  Result Date: 02/24/2018 CLINICAL DATA:  Fall 2 days ago. EXAM: CT CHEST, ABDOMEN, AND PELVIS WITH CONTRAST TECHNIQUE: Multidetector CT imaging of the chest, abdomen and pelvis was performed following the standard protocol during bolus administration of intravenous contrast. CONTRAST:  100mL OMNIPAQUE IOHEXOL 300 MG/ML  SOLN COMPARISON:  Chest x-ray from same day. CT abdomen pelvis dated November 07, 2010. FINDINGS: CT CHEST FINDINGS Cardiovascular: Normal heart size. No pericardial effusion. Normal caliber thoracic aorta. No aortic dissection. Aortic arch atherosclerosis. No central pulmonary embolism. Mediastinum/Nodes: No enlarged mediastinal, hilar, or axillary lymph nodes. Thyroid gland, trachea, and esophagus demonstrate no significant findings. Unchanged large hiatal hernia. Lungs/Pleura: No focal consolidation, pleural effusion, or pneumothorax. 6 x 5 mm nodule in the left lower lobe. Minimal bilateral lower lobe subsegmental atelectasis. Scarring in the lingula. Musculoskeletal: Acute to subacute mild T7 and moderate T8 compression deformities with small amount of paravertebral hematoma. Unchanged chronic mild superior endplate compression deformities of T10, T11, and  T12. No sternal or rib fracture. CT ABDOMEN PELVIS FINDINGS Hepatobiliary: Scattered subcentimeter low-density lesions in the liver are too small to characterize. Status post cholecystectomy. No biliary dilatation. Pancreas: Unremarkable. No pancreatic ductal dilatation or surrounding inflammatory changes. Spleen: Normal in size without focal abnormality. Adrenals/Urinary Tract: The adrenal  glands are unremarkable. 1.4 cm simple cyst in the right kidney. Interval enlargement of the simple cyst in the left kidney, now measuring 5.1 cm. No renal or ureteral calculi. No hydronephrosis. The bladder is unremarkable. Stomach/Bowel: Large hiatal hernia containing stomach and the splenic flexure. No evidence of bowel wall thickening, distention, or surrounding inflammatory changes. Normal appendix. Vascular/Lymphatic: Mild aortic atherosclerosis. No enlarged abdominal or pelvic lymph nodes. Reproductive: Uterus and bilateral adnexa are unremarkable. Other: No free fluid or pneumoperitoneum. Musculoskeletal: No acute or significant osseous findings. Unchanged chronic moderate compression fracture of L2. IMPRESSION: 1. Acute to subacute mild T7 and moderate T8 compression fractures. 2. No other evidence of traumatic injury within the chest, abdomen, or pelvis. 3. 6 mm nodule in the left lower lobe. Non-contrast chest CT at 6-12 months is recommended. If the nodule is stable at time of repeat CT, then future CT at 18-24 months (from today's scan) is considered optional for low-risk patients, but is recommended for high-risk patients. This recommendation follows the consensus statement: Guidelines for Management of Incidental Pulmonary Nodules Detected on CT Images: From the Fleischner Society 2017; Radiology 2017; 284:228-243. 4. Unchanged large hiatal hernia containing stomach and the splenic flexure. 5. Chronic compression fractures of T10 through T12 and L2. 6.  Aortic atherosclerosis (ICD10-I70.0). Electronically Signed   By: Obie Dredge M.D.   On: 02/24/2018 14:04   Ct T-spine No Charge Ct L-spine No Charge  Result Date: 02/24/2018 CLINICAL DATA:  Fall 2 days ago. EXAM: CT THORACIC AND LUMBAR SPINE WITHOUT CONTRAST TECHNIQUE: Multidetector CT imaging of the thoracic and lumbar spine was performed without intravenous contrast. Multiplanar CT image reconstructions were also generated. COMPARISON:  CT  abdomen pelvis dated November 07, 2010. FINDINGS: CT THORACIC SPINE FINDINGS Alignment: Mild dextroscoliosis, apex at T7-T8. Sagittal alignment is maintained. Vertebrae: Acute to subacute mild T7 and moderate T8 compression fractures with a small amount of paravertebral hematoma. No retropulsion. Chronic mild superior endplate compression fractures of T10 through T12. Osteopenia. Paraspinal and other soft tissues: Please see separate CT chest report from same day. Disc levels: Disc heights are relatively preserved. No significant spinal canal or neuroforaminal stenosis. CT LUMBAR SPINE FINDINGS Segmentation: 5 lumbar type vertebrae. Alignment: Normal. Vertebrae: No acute fracture. Chronic moderate superior endplate compression fracture of L2 with 2-3 mm retropulsion. Osteopenia. Paraspinal and other soft tissues: Please see separate CT abdomen and pelvis report from same day. Disc levels: Disc heights are relatively preserved. No significant spinal canal or neuroforaminal stenosis. IMPRESSION: 1. Acute to subacute mild T7 and moderate T8 compression fractures. 2. Chronic compression fractures of T10 through T12 and L2. Minimal retropulsion at L2 is unchanged. Electronically Signed   By: Obie Dredge M.D.   On: 02/24/2018 14:13     IMPRESSION:   *   Bloody stools, initially diarrhea.  Some abdominal pain. Question ischemic colitis, though no evidence of colitis on contrast CT..  *    Intermittent nausea despite daily PPI.  Large hiatal hernia per CT scan.    *    Acute on chronic compression fractures in patient with chronic musculoskeletal pain.  Previous PMD discontinued oxycodone last month and she is been using Excedrin in large  doses since then.  Chronic Mobic.     PLAN:     *   Upper endoscopy and colonoscopy set for around 10:00 tomorrow morning.  See orders for split dose prep.  Can have clear liquids today.  Continue oral PPI.   Jennye Moccasin  02/24/2018, 3:08 PM Phone [phone]  [phone]     Attending physician's note   I have taken a history, examined the patient and reviewed the chart. I agree with the Advanced Practitioner's note, impression and recommendations. Hospitalized with bloody stool and a microcytic anemia was diagnosed in 05/2017. Trend CBC. PPI po. Plan for colonoscopy and EGD tomorrow.   Claudette Head, MD FACG [phone] office

## 2018-02-25 ENCOUNTER — Inpatient Hospital Stay (HOSPITAL_COMMUNITY): Payer: Medicare Other | Admitting: Certified Registered Nurse Anesthetist

## 2018-02-25 ENCOUNTER — Encounter (HOSPITAL_COMMUNITY): Payer: Self-pay | Admitting: *Deleted

## 2018-02-25 ENCOUNTER — Encounter (HOSPITAL_COMMUNITY)
Admission: EM | Disposition: A | Discharge status: Home / Self Care | Payer: Self-pay | Source: Home / Self Care | Attending: Nephrology

## 2018-02-25 DIAGNOSIS — K228 Other specified diseases of esophagus: Secondary | ICD-10-CM

## 2018-02-25 DIAGNOSIS — K2100 Gastro-esophageal reflux disease with esophagitis, without bleeding: Secondary | ICD-10-CM

## 2018-02-25 DIAGNOSIS — K3189 Other diseases of stomach and duodenum: Secondary | ICD-10-CM

## 2018-02-25 DIAGNOSIS — E039 Hypothyroidism, unspecified: Secondary | ICD-10-CM

## 2018-02-25 DIAGNOSIS — I10 Essential (primary) hypertension: Secondary | ICD-10-CM

## 2018-02-25 DIAGNOSIS — K449 Diaphragmatic hernia without obstruction or gangrene: Secondary | ICD-10-CM

## 2018-02-25 DIAGNOSIS — S22060A Wedge compression fracture of T7-T8 vertebra, initial encounter for closed fracture: Secondary | ICD-10-CM

## 2018-02-25 DIAGNOSIS — K6389 Other specified diseases of intestine: Secondary | ICD-10-CM

## 2018-02-25 DIAGNOSIS — D509 Iron deficiency anemia, unspecified: Secondary | ICD-10-CM

## 2018-02-25 DIAGNOSIS — K2270 Barrett's esophagus without dysplasia: Secondary | ICD-10-CM

## 2018-02-25 DIAGNOSIS — K921 Melena: Principal | ICD-10-CM

## 2018-02-25 HISTORY — PX: BIOPSY: SHX5522

## 2018-02-25 HISTORY — PX: ESOPHAGOGASTRODUODENOSCOPY (EGD) WITH PROPOFOL: SHX5813

## 2018-02-25 HISTORY — PX: COLONOSCOPY WITH PROPOFOL: SHX5780

## 2018-02-25 LAB — BASIC METABOLIC PANEL
Anion gap: 10 (ref 5–15)
BUN: 9 mg/dL (ref 6–20)
CALCIUM: 8.8 mg/dL — AB (ref 8.9–10.3)
CO2: 22 mmol/L (ref 22–32)
CREATININE: 0.82 mg/dL (ref 0.44–1.00)
Chloride: 113 mmol/L — ABNORMAL HIGH (ref 101–111)
GFR calc non Af Amer: >60 mL/min (ref >60)
Glucose, Bld: 86 mg/dL (ref 65–99)
Potassium: 3.7 mmol/L (ref 3.5–5.1)
SODIUM: 145 mmol/L (ref 135–145)

## 2018-02-25 LAB — CBC
HCT: 39.1 % (ref 36.0–46.0)
HEMOGLOBIN: 11.7 g/dL — AB (ref 12.0–15.0)
MCH: 26.2 pg (ref 26.0–34.0)
MCHC: 29.9 g/dL — ABNORMAL LOW (ref 30.0–36.0)
MCV: 87.5 fL (ref 78.0–100.0)
PLATELETS: 248 10*3/uL (ref 150–400)
RBC: 4.47 MIL/uL (ref 3.87–5.11)
RDW: 14.8 % (ref 11.5–15.5)
WBC: 5.7 10*3/uL (ref 4.0–10.5)

## 2018-02-25 SURGERY — COLONOSCOPY WITH PROPOFOL
Anesthesia: Monitor Anesthesia Care

## 2018-02-25 MED ORDER — LACTATED RINGERS IV SOLN
INTRAVENOUS | Status: DC | PRN
  Administered 2018-02-25: 09:00:00 via INTRAVENOUS

## 2018-02-25 MED ORDER — METOCLOPRAMIDE HCL 10 MG PO TABS
10.0000 mg | ORAL_TABLET | Freq: Once | ORAL | Status: AC
  Administered 2018-02-25: 10 mg via ORAL
  Filled 2018-02-25: qty 1

## 2018-02-25 MED ORDER — LIDOCAINE 2% (20 MG/ML) 5 ML SYRINGE
INTRAMUSCULAR | Status: DC | PRN
  Administered 2018-02-25: 40 mg via INTRAVENOUS

## 2018-02-25 MED ORDER — PROPOFOL 10 MG/ML IV BOLUS
INTRAVENOUS | Status: DC | PRN
  Administered 2018-02-25: 20 mg via INTRAVENOUS
  Administered 2018-02-25 (×2): 30 mg via INTRAVENOUS
  Administered 2018-02-25: 40 mg via INTRAVENOUS
  Administered 2018-02-25: 30 mg via INTRAVENOUS
  Administered 2018-02-25: 40 mg via INTRAVENOUS
  Administered 2018-02-25: 80 mg via INTRAVENOUS
  Administered 2018-02-25: 30 mg via INTRAVENOUS
  Administered 2018-02-25: 20 mg via INTRAVENOUS
  Administered 2018-02-25: 30 mg via INTRAVENOUS
  Administered 2018-02-25: 50 mg via INTRAVENOUS

## 2018-02-25 MED ORDER — FENTANYL CITRATE (PF) 100 MCG/2ML IJ SOLN
INTRAMUSCULAR | Status: DC | PRN
  Administered 2018-02-25: 50 ug via INTRAVENOUS

## 2018-02-25 MED ORDER — PHENYLEPHRINE 40 MCG/ML (10ML) SYRINGE FOR IV PUSH (FOR BLOOD PRESSURE SUPPORT)
PREFILLED_SYRINGE | INTRAVENOUS | Status: DC | PRN
  Administered 2018-02-25 (×5): 80 ug via INTRAVENOUS

## 2018-02-25 MED ORDER — ACETAMINOPHEN 325 MG PO TABS
650.0000 mg | ORAL_TABLET | Freq: Four times a day (QID) | ORAL | Status: DC | PRN
Start: 2018-02-25 — End: 2018-02-25
  Administered 2018-02-25: 650 mg via ORAL
  Filled 2018-02-25: qty 2

## 2018-02-25 MED ORDER — GLYCOPYRROLATE PF 0.2 MG/ML IJ SOSY
PREFILLED_SYRINGE | INTRAMUSCULAR | Status: DC | PRN
  Administered 2018-02-25: .1 mg via INTRAVENOUS

## 2018-02-25 MED ORDER — PANTOPRAZOLE SODIUM 40 MG PO TBEC: 40.0000 mg | DELAYED_RELEASE_TABLET | Freq: Every day | ORAL | 2 refills | Status: DC

## 2018-02-25 MED ORDER — ONDANSETRON HCL 4 MG/2ML IJ SOLN
INTRAMUSCULAR | Status: DC | PRN
  Administered 2018-02-25: 4 mg via INTRAVENOUS

## 2018-02-25 MED ORDER — LACTATED RINGERS IV SOLN
INTRAVENOUS | Status: DC
  Administered 2018-02-25: 09:00:00 via INTRAVENOUS

## 2018-02-25 SURGICAL SUPPLY — 25 items

## 2018-02-25 NOTE — Interval H&P Note (Signed)
History and Physical Interval Note:  02/25/2018 9:31 AM  Sarah Miranda  has presented today for surgery, with the diagnosis of Anemia.  Bloody stool.  Intermittent nausea.  Heavy aspirin use.  The various methods of treatment have been discussed with the patient and family. After consideration of risks, benefits and other options for treatment, the patient has consented to  Procedure(s): COLONOSCOPY WITH PROPOFOL (N/A) ESOPHAGOGASTRODUODENOSCOPY (EGD) WITH PROPOFOL (N/A) as a surgical intervention .  The patient's history has been reviewed, patient examined, no change in status, stable for surgery.  I have reviewed the patient's chart and labs.  Questions were answered to the patient's satisfaction.     Venita LickMalcolm T. Russella DarStark

## 2018-02-25 NOTE — Transfer of Care (Signed)
Immediate Anesthesia Transfer of Care Note  Patient: Sarah Miranda  Procedure(s) Performed: COLONOSCOPY WITH PROPOFOL (N/A ) ESOPHAGOGASTRODUODENOSCOPY (EGD) WITH PROPOFOL (N/A ) BIOPSY  Patient Location: Endoscopy Unit  Anesthesia Type:MAC  Level of Consciousness: awake and patient cooperative  Airway & Oxygen Therapy: Patient Spontanous Breathing and Patient connected to nasal cannula oxygen  Post-op Assessment: Report given to RN and Post -op Vital signs reviewed and stable  Post vital signs: Reviewed and stable  Last Vitals:  Vitals Value Taken Time  BP 115/60 02/25/2018 10:26 AM  Temp    Pulse 36 02/25/2018 10:25 AM  Resp 21 02/25/2018 10:26 AM  SpO2 81 % 02/25/2018 10:25 AM  Vitals shown include unvalidated device data.  Last Pain:  Vitals:   02/25/18 0857  TempSrc: Oral  PainSc: 0-No pain      Patients Stated Pain Goal: 5 (08/29/81 5003)  Complications: No apparent anesthesia complications

## 2018-02-25 NOTE — Op Note (Signed)
[facility] Patient Name: Sarah LandryBrenda Miranda Procedure Date : 02/25/2018 MRN: [id number] Attending MD: Meryl DareMalcolm T Cylan Borum , MD Date of Birth: February 19, 1946 CSN: [id number] Age: 72 Admit Type: Inpatient Procedure:                Colonoscopy Indications:              Clinically significant diarrhea of unexplained                            origin, Hematochezia, Iron deficiency anemia Providers:                Venita LickMalcolm T. Russella DarStark, MD, Roselie AwkwardShannon Love, RN, Zoila ShutterGary                            Bryant, Technician, Teresita MaduraPaul Welty, CRNA Referring MD:             Redge GainerMark A. Perini, MD Medicines:                Monitored Anesthesia Care Complications:            No immediate complications. Estimated blood loss:                            None. Estimated Blood Loss:     Estimated blood loss: none. Procedure:                Pre-Anesthesia Assessment:                           - Prior to the procedure, a History and Physical                            was performed, and patient medications and                            allergies were reviewed. The patient's tolerance of                            previous anesthesia was also reviewed. The risks                            and benefits of the procedure and the sedation                            options and risks were discussed with the patient.                            All questions were answered, and informed consent                            was obtained. Prior Anticoagulants: The patient has                            taken no previous anticoagulant or antiplatelet  agents. ASA Grade Assessment: III - A patient with                            severe systemic disease. After reviewing the risks                            and benefits, the patient was deemed in                            satisfactory condition to undergo the procedure.                           After obtaining informed consent, the colonoscope       was passed under direct vision. Throughout the                            procedure, the patient's blood pressure, pulse, and                            oxygen saturations were monitored continuously. The                            EC-[id number] ([id number]) scope was introduced through                            the anus and advanced to the the cecum, identified                            by appendiceal orifice and ileocecal valve. The                            ileocecal valve, appendiceal orifice, and rectum                            were photographed. The quality of the bowel                            preparation was adequate. The colonoscopy was                            performed without difficulty. The patient tolerated                            the procedure well. Scope In: 9:39:46 AM Scope Out: 9:56:24 AM Scope Withdrawal Time: 0 hours 12 minutes 4 seconds  Total Procedure Duration: 0 hours 16 minutes 38 seconds  Findings:      The perianal and digital rectal examinations were normal.      A segmental area of mildly congested, erythematous and granular mucosa       was found in the descending colon. Biopsies were taken with a cold       forceps for histology.      The exam was otherwise without abnormality on direct and retroflexion       views. Impression:               -  Congested, erythematous and granular mucosa in                            the descending colon. Biopsied.                           - The examination was otherwise normal on direct                            and retroflexion views. Recommendation:           - Patient has a contact number available for                            emergencies. The signs and symptoms of potential                            delayed complications were discussed with the                            patient. Return to normal activities tomorrow.                            Written discharge instructions were provided to the                             patient.                           - Resume previous diet.                           - Continue present medications.                           - Await pathology results.                           - No repeat colonoscopy due to age. Procedure Code(s):        --- Professional ---                           [phone], Colonoscopy, flexible; with biopsy, single                            or multiple Diagnosis Code(s):        --- Professional ---                           K63.89, Other specified diseases of intestine                           R19.7, Diarrhea, unspecified                           K92.1, Melena (includes Hematochezia)  D50.9, Iron deficiency anemia, unspecified CPT copyright 2017 American [employer]. All rights reserved. The codes documented in this report are preliminary and upon coder review may  be revised to meet current compliance requirements. Meryl Dare, MD 02/25/2018 10:01:40 AM This report has been signed electronically. Number of Addenda: 0

## 2018-02-25 NOTE — Anesthesia Postprocedure Evaluation (Signed)
Anesthesia Post Note  Patient: Sarah Miranda  Procedure(s) Performed: COLONOSCOPY WITH PROPOFOL (N/A ) ESOPHAGOGASTRODUODENOSCOPY (EGD) WITH PROPOFOL (N/A ) BIOPSY     Patient location during evaluation: PACU Anesthesia Type: MAC Level of consciousness: awake and alert Pain management: pain level controlled Vital Signs Assessment: post-procedure vital signs reviewed and stable Respiratory status: spontaneous breathing, nonlabored ventilation, respiratory function stable and patient connected to nasal cannula oxygen Cardiovascular status: stable and blood pressure returned to baseline Postop Assessment: no apparent nausea or vomiting Anesthetic complications: no    Last Vitals:  Vitals:   02/25/18 1033 02/25/18 1037  BP: (!) 141/76 (!) 146/84  Pulse: 86 89  Resp: (!) 23 16  Temp:    SpO2: 100% 99%    Last Pain:  Vitals:   02/25/18 1037  TempSrc:   PainSc: 0-No pain                 Matrice Herro DAVID

## 2018-02-25 NOTE — Op Note (Signed)
[facility] Patient Name: Sarah Miranda Procedure Date : 02/25/2018 MRN: [id number] Attending MD: Meryl Dare , MD Date of Birth: [birth year] CSN: [id number] Age: 72 Admit Type: Inpatient Procedure:                Upper GI endoscopy Indications:              Iron deficiency anemia Providers:                Venita Lick. Russella Dar, MD, Roselie Awkward, RN, Zoila Shutter, Technician, Teresita Madura, CRNA Referring MD:             Redge Gainer. Perini, MD Medicines:                Monitored Anesthesia Care Complications:            No immediate complications. Estimated Blood Loss:     Estimated blood loss was minimal. Procedure:                Pre-Anesthesia Assessment:                           - Prior to the procedure, a History and Physical                            was performed, and patient medications and                            allergies were reviewed. The patient's tolerance of                            previous anesthesia was also reviewed. The risks                            and benefits of the procedure and the sedation                            options and risks were discussed with the patient.                            All questions were answered, and informed consent                            was obtained. Prior Anticoagulants: The patient has                            taken no previous anticoagulant or antiplatelet                            agents. ASA Grade Assessment: III - A patient with                            severe systemic disease. After reviewing the risks  and benefits, the patient was deemed in                            satisfactory condition to undergo the procedure.                           - Prior to the procedure, a History and Physical                            was performed, and patient medications and                            allergies were reviewed. The patient's tolerance of                 previous anesthesia was also reviewed. The risks                            and benefits of the procedure and the sedation                            options and risks were discussed with the patient.                            All questions were answered, and informed consent                            was obtained. Prior Anticoagulants: The patient has                            taken no previous anticoagulant or antiplatelet                            agents. ASA Grade Assessment: III - A patient with                            severe systemic disease. After reviewing the risks                            and benefits, the patient was deemed in                            satisfactory condition to undergo the procedure.                           After obtaining informed consent, the endoscope was                            passed under direct vision. Throughout the                            procedure, the patient's blood pressure, pulse, and  oxygen saturations were monitored continuously. The                            Endoscope was introduced through the mouth, and                            advanced to the second part of duodenum. The                            [id number] ([id number]) scope was introduced through the                            and advanced to the. The upper GI endoscopy was                            accomplished without difficulty. The patient                            tolerated the procedure well. Scope In: Scope Out: Findings:      LA Grade B (one or more mucosal breaks greater than 5 mm, not extending       between the tops of two mucosal folds) esophagitis with no bleeding was       found in the distal esophagus.      Circumferential salmon-colored mucosa was present at 35 -39 cm. The       maximum longitudinal extent of these esophageal mucosal changes was 4 cm       in length. Biopsies were taken with a cold forceps for  histology.      The exam of the esophagus was otherwise normal.      A large hiatal hernia was present.      The exam of the stomach was otherwise normal.      The duodenal bulb and second portion of the duodenum were normal. Impression:               - LA Grade B reflux esophagitis.                           - Salmon-colored mucosa suspicious for long-segment                            Barrett's esophagus. Biopsied.                           - Large hiatal hernia.                           - Normal duodenal bulb and second portion of the                            duodenum. Recommendation:           - Return patient to hospital ward for possible                            discharge same day.                           -  Resume previous diet.                           - Antireflux measures long term.                           - Continue present medications.                           - Protonix 40 mg po qd long term.                           - Await pathology results.                           - OK for discharge later today from GI standpoint.                           - Post hospital follow up with PCP.                           - GI office appt in 1 month. Procedure Code(s):        --- Professional ---                           [phone], Esophagogastroduodenoscopy, flexible,                            transoral; with biopsy, single or multiple Diagnosis Code(s):        --- Professional ---                           K21.0, Gastro-esophageal reflux disease with                            esophagitis                           K22.8, Other specified diseases of esophagus                           K44.9, Diaphragmatic hernia without obstruction or                            gangrene                           D50.9, Iron deficiency anemia, unspecified CPT copyright 2017 American Medical Association. All rights reserved. The codes documented in this report are preliminary and upon coder review  may  be revised to meet current compliance requirements. Meryl DareMalcolm T Adea Geisel, MD 02/25/2018 10:30:42 AM This report has been signed electronically. Number of Addenda: 0

## 2018-02-25 NOTE — Anesthesia Procedure Notes (Addendum)
Procedure Name: MAC Date/Time: 02/25/2018 9:46 AM Performed by: Orlie Dakin, CRNA Pre-anesthesia Checklist: Patient identified, Emergency Drugs available, Suction available, Patient being monitored and Timeout performed Patient Re-evaluated:Patient Re-evaluated prior to induction Oxygen Delivery Method: Nasal cannula Preoxygenation: Pre-oxygenation with 100% oxygen Induction Type: IV induction

## 2018-02-25 NOTE — Anesthesia Preprocedure Evaluation (Addendum)
Anesthesia Evaluation  Patient identified by MRN, date of birth, ID band Patient awake    Reviewed: Allergy & Precautions, NPO status , Patient's Chart, lab work & pertinent test results  Airway Mallampati: I  TM Distance: >3 FB Neck ROM: Full    Dental  (+) Dental Advisory Given,    Pulmonary    Pulmonary exam normal        Cardiovascular hypertension, Pt. on medications Normal cardiovascular exam     Neuro/Psych Anxiety Depression Bipolar Disorder    GI/Hepatic GERD  Medicated and Controlled,  Endo/Other    Renal/GU      Musculoskeletal   Abdominal   Peds  Hematology   Anesthesia Other Findings   Reproductive/Obstetrics                            Anesthesia Physical Anesthesia Plan  ASA: III  Anesthesia Plan: MAC   Post-op Pain Management:    Induction: Intravenous  PONV Risk Score and Plan: 2 and Treatment may vary due to age or medical condition  Airway Management Planned: Simple Face Mask  Additional Equipment:   Intra-op Plan:   Post-operative Plan:   Informed Consent: I have reviewed the patients History and Physical, chart, labs and discussed the procedure including the risks, benefits and alternatives for the proposed anesthesia with the patient or authorized representative who has indicated his/her understanding and acceptance.     Plan Discussed with: CRNA and Surgeon  Anesthesia Plan Comments:         Anesthesia Quick Evaluation

## 2018-02-25 NOTE — Discharge Summary (Signed)
Physician Discharge Summary  Sarah Miranda JYN:[id number] DOB: [birth year] DOA: 02/24/2018  PCP: Rodrigo Ran, MD  Admit date: 02/24/2018 Discharge date: 02/25/2018  Admitted From: Home.  Disposition:  Home   Recommendations for Outpatient Follow-up:  1. Follow up with PCP in 1-2 weeks 2. Please obtain BMP/CBC in one week 3. Please follow up with Gastroenterology in 2 weeks.  4. Follow up with IR as outpatient for the compression fractures.   Discharge Condition: stable.  CODE STATUS: full code.  Diet recommendation: Heart Healthy   Brief/Interim Summary: Sarah Miranda is a very pleasant 72 y.o. female with medical history significant for hypertension, hypothyroidism, fibromyalgia, bipolar disorder, presents to the emergency Department  With chief complaint hematochezia and frequent falls.    Discharge Diagnoses:  Principal Problem:   Hematochezia Active Problems:   Fibromyalgia   Anxiety   Hypothyroidism   Bipolar disorder (HCC)   Essential hypertension   Compression fracture of T7 vertebra (HCC)   Tachycardia   Iron deficiency anemia   Gastroesophageal reflux disease with esophagitis  Hematochezia:  Lower GI bleed ruled out.  GI consulted and underwent EGd and colonoscopy.  Pt was found to have EGD:   LA Grade B reflux esophagitis. - Salmon-colored mucosa suspicious for long-segment Barrett's esophagus. Biopsied. - Large hiatal hernia. - Normal duodenal bulb and second portion of the duodenum   Colonoscopy revealed  Congested, erythematous and granular mucosa in the descending colon. Biopsied. - The examination was otherwise normal on direct and retroflexion views.   She was recommended to stop the [facility] and NSAIDS , and use tylenol instead.  Continue with protonix 40 mg daily and follow up with GI for the biopsies.    Compression fractures: Outpatient follow up with IR .  Pain control and physical therapy.    Hypothyroidism: Resume synthroid.     Hypertension  Well controlled.     Discharge Instructions  Discharge Instructions    Diet - low sodium heart healthy   Complete by:  As directed    Discharge instructions   Complete by:  As directed    Please follow up with Gastroenterology in one month as recommended.     Allergies as of 02/25/2018      Reactions   Fetzima [levomilnacipran] Nausea Only      Medication List    STOP taking these medications   aspirin-acetaminophen-caffeine 250-250-65 MG tablet Commonly known as:  EXCEDRIN MIGRAINE   meloxicam 7.5 MG tablet Commonly known as:  MOBIC   NEXIUM 24HR 20 MG capsule Generic drug:  esomeprazole Replaced by:  pantoprazole 40 MG tablet     TAKE these medications   acetaminophen 500 MG tablet Commonly known as:  TYLENOL Take 1,000 mg by mouth as needed for mild pain.   amitriptyline 25 MG tablet Commonly known as:  ELAVIL TAKE 1 TABLET(25 MG) BY MOUTH AT BEDTIME   FLUoxetine 40 MG capsule Commonly known as:  PROZAC TAKE 1 CAPSULE BY MOUTH DAILY TO HELP NERVES AND DEPRESSION   levothyroxine 25 MCG tablet Commonly known as:  SYNTHROID, LEVOTHROID TAKE 1 TABLET BY MOUTH EVERY DAY   multivitamin with minerals Tabs tablet Take 1 tablet by mouth daily.   MUSCLE RUB 10-15 % Crea Apply 1 application topically as needed for muscle pain.   ondansetron 4 MG tablet Commonly known as:  ZOFRAN TAKE 1 TABLET BY MOUTH EVERY 8 HOURS AS NEEDED FOR NAUSEA   pantoprazole 40 MG tablet Commonly known as:  PROTONIX Take 1  tablet (40 mg total) by mouth daily. Start taking on:  02/26/2018 Replaces:  NEXIUM 24HR 20 MG capsule   pregabalin 150 MG capsule Commonly known as:  LYRICA Take 1 capsule (150 mg total) by mouth daily.   risperiDONE 1 MG tablet Commonly known as:  RISPERDAL TAKE 1 TABLET BY MOUTH TWICE DAILY TO HELP RELAX   senna 8.6 MG Tabs tablet Commonly known as:  SENOKOT Take 1 tablet (8.6 mg total) by mouth daily as needed for mild  constipation.      Follow-up Information    Meryl Dare, MD. Schedule an appointment as soon as possible for a visit in 1 month(s).   Specialty:  Gastroenterology Contact information: [address] [place] [address] [phone]        Rodrigo Ran, MD. Schedule an appointment as soon as possible for a visit in 1 week(s).   Specialty:  Internal Medicine Contact information: [address] [place] [address] [phone]          Allergies  Allergen Reactions  . Fetzima [Levomilnacipran] Nausea Only    Consultations:  Gastroenterology Dr Russella Dar    Procedures/Studies: Dg Chest 2 View  Result Date: 02/24/2018 CLINICAL DATA:  Patient having syncope episodes. started this morning. EXAM: CHEST - 2 VIEW COMPARISON:  02/03/2018 and multiple additional exams including 10/31/2010 FINDINGS: There is a large hiatal hernia projecting at the LEFT lung base. These retrocardiac region is opacified in likely related to the large hiatal hernia. The RIGHT lung is clear. There is mild perihilar peribronchial thickening but no pulmonary edema. Bones appear radiolucent. There multiple wedge compression fractures and endplate fractures throughout the thoracic and visualized lumbar spine. IMPRESSION: 1.  No evidence for acute  abnormality. 2. Large hiatal hernia is stable. 3. Numerous wedge compression fractures in the thoracic and lumbar spine. 4. Recommend further evaluation with bone mineral density exam (DXA). Electronically Signed   By: Norva Pavlov M.D.   On: 02/24/2018 12:05   Ct Head Wo Contrast  Result Date: 02/24/2018 CLINICAL DATA:  Pain following fall EXAM: CT HEAD WITHOUT CONTRAST CT CERVICAL SPINE WITHOUT CONTRAST TECHNIQUE: Multidetector CT imaging of the head and cervical spine was performed following the standard protocol without intravenous contrast. Multiplanar CT image reconstructions of the cervical spine were also generated. COMPARISON:  March 03, 2009  FINDINGS: CT HEAD FINDINGS Brain: There is stable moderate frontal and temporal lobe atrophy. There is mild atrophy elsewhere, stable. There is no intracranial mass, hemorrhage, extra-axial fluid collection, or midline shift. There is patchy small vessel disease in the centra semiovale bilaterally. Elsewhere gray-white compartments appear unremarkable. There is no evident acute infarct. Basal ganglia calcification bilaterally is felt to be physiologic in this age group. Vascular: There is no hyperdense vessel. There is calcification in each carotid siphon region. Skull: Bony calvarium appears intact. Sinuses/Orbits: There is a retention cyst in the inferior right maxillary antrum. There is opacification in the posterior right ethmoid air cells. Other paranasal sinuses are clear. There is rightward deviation of the nasal septum. Orbits appear symmetric bilaterally. Other: Mastoid air cells are clear on the right. There is opacification in several peripheral mastoid air cells on the left, stable. CT CERVICAL SPINE FINDINGS Alignment: There is no appreciable spondylolisthesis. Skull base and vertebrae: Skull base and craniocervical junction regions appear normal. No evident fracture. There are no blastic or lytic bone lesions. Soft tissues and spinal canal: Prevertebral soft tissues and predental space regions are normal. There are no paraspinous lesions.  There is no evident cord or canal hematoma. Disc levels: Disc spaces appear unremarkable. There is mild facet hypertrophy at several levels. No disc extrusion or stenosis evident. No nerve root edema or effacement. Upper chest: Visualized upper lung zones are clear. Other: There is calcification in each carotid artery. IMPRESSION: CT head: Stable areas of atrophy with patchy periventricular small vessel disease. No evident acute infarct. No mass or hemorrhage. There are foci of arterial vascular calcification. There is paranasal sinus disease at several sites as well  as chronic left-sided mastoid disease. There is nasal septal deviation. CT cervical spine: No evident fracture or spondylolisthesis. There is mild osteoarthritic change at several levels. No nerve root edema or effacement. No disc extrusion or stenosis. There is carotid artery calcification bilaterally. Electronically Signed   By: Bretta Bang III M.D.   On: 02/24/2018 13:45   Ct Chest W Contrast  Result Date: 02/24/2018 CLINICAL DATA:  Fall 2 days ago. EXAM: CT CHEST, ABDOMEN, AND PELVIS WITH CONTRAST TECHNIQUE: Multidetector CT imaging of the chest, abdomen and pelvis was performed following the standard protocol during bolus administration of intravenous contrast. CONTRAST:  OMNIPAQUE IOHEXOL 300 MG/ML  SOLN COMPARISON:  Chest x-ray from same day. CT abdomen pelvis dated November 07, 2010. FINDINGS: CT CHEST FINDINGS Cardiovascular: Normal heart size. No pericardial effusion. Normal caliber thoracic aorta. No aortic dissection. Aortic arch atherosclerosis. No central pulmonary embolism. Mediastinum/Nodes: No enlarged mediastinal, hilar, or axillary lymph nodes. Thyroid gland, trachea, and esophagus demonstrate no significant findings. Unchanged large hiatal hernia. Lungs/Pleura: No focal consolidation, pleural effusion, or pneumothorax. 6 x 5 mm nodule in the left lower lobe. Minimal bilateral lower lobe subsegmental atelectasis. Scarring in the lingula. Musculoskeletal: Acute to subacute mild T7 and moderate T8 compression deformities with small amount of paravertebral hematoma. Unchanged chronic mild superior endplate compression deformities of T10, T11, and T12. No sternal or rib fracture. CT ABDOMEN PELVIS FINDINGS Hepatobiliary: Scattered subcentimeter low-density lesions in the liver are too small to characterize. Status post cholecystectomy. No biliary dilatation. Pancreas: Unremarkable. No pancreatic ductal dilatation or surrounding inflammatory changes. Spleen: Normal in size without focal  abnormality. Adrenals/Urinary Tract: The adrenal glands are unremarkable. 1.4 cm simple cyst in the right kidney. Interval enlargement of the simple cyst in the left kidney, now measuring 5.1 cm. No renal or ureteral calculi. No hydronephrosis. The bladder is unremarkable. Stomach/Bowel: Large hiatal hernia containing stomach and the splenic flexure. No evidence of bowel wall thickening, distention, or surrounding inflammatory changes. Normal appendix. Vascular/Lymphatic: Mild aortic atherosclerosis. No enlarged abdominal or pelvic lymph nodes. Reproductive: Uterus and bilateral adnexa are unremarkable. Other: No free fluid or pneumoperitoneum. Musculoskeletal: No acute or significant osseous findings. Unchanged chronic moderate compression fracture of L2. IMPRESSION: 1. Acute to subacute mild T7 and moderate T8 compression fractures. 2. No other evidence of traumatic injury within the chest, abdomen, or pelvis. 3. 6 mm nodule in the left lower lobe. Non-contrast chest CT at 6-12 months is recommended. If the nodule is stable at time of repeat CT, then future CT at 18-24 months (from today's scan) is considered optional for low-risk patients, but is recommended for high-risk patients. This recommendation follows the consensus statement: Guidelines for Management of Incidental Pulmonary Nodules Detected on CT Images: From the Fleischner Society 2017; Radiology 2017; 284:228-243. 4. Unchanged large hiatal hernia containing stomach and the splenic flexure. 5. Chronic compression fractures of T10 through T12 and L2. 6.  Aortic atherosclerosis (ICD10-I70.0). Electronically Signed   By: Obie Dredge  M.D.   On: 02/24/2018 14:04   Ct Cervical Spine Wo Contrast  Result Date: 02/24/2018 CLINICAL DATA:  Pain following fall EXAM: CT HEAD WITHOUT CONTRAST CT CERVICAL SPINE WITHOUT CONTRAST TECHNIQUE: Multidetector CT imaging of the head and cervical spine was performed following the standard protocol without intravenous  contrast. Multiplanar CT image reconstructions of the cervical spine were also generated. COMPARISON:  March 03, 2009 FINDINGS: CT HEAD FINDINGS Brain: There is stable moderate frontal and temporal lobe atrophy. There is mild atrophy elsewhere, stable. There is no intracranial mass, hemorrhage, extra-axial fluid collection, or midline shift. There is patchy small vessel disease in the centra semiovale bilaterally. Elsewhere gray-white compartments appear unremarkable. There is no evident acute infarct. Basal ganglia calcification bilaterally is felt to be physiologic in this age group. Vascular: There is no hyperdense vessel. There is calcification in each carotid siphon region. Skull: Bony calvarium appears intact. Sinuses/Orbits: There is a retention cyst in the inferior right maxillary antrum. There is opacification in the posterior right ethmoid air cells. Other paranasal sinuses are clear. There is rightward deviation of the nasal septum. Orbits appear symmetric bilaterally. Other: Mastoid air cells are clear on the right. There is opacification in several peripheral mastoid air cells on the left, stable. CT CERVICAL SPINE FINDINGS Alignment: There is no appreciable spondylolisthesis. Skull base and vertebrae: Skull base and craniocervical junction regions appear normal. No evident fracture. There are no blastic or lytic bone lesions. Soft tissues and spinal canal: Prevertebral soft tissues and predental space regions are normal. There are no paraspinous lesions. There is no evident cord or canal hematoma. Disc levels: Disc spaces appear unremarkable. There is mild facet hypertrophy at several levels. No disc extrusion or stenosis evident. No nerve root edema or effacement. Upper chest: Visualized upper lung zones are clear. Other: There is calcification in each carotid artery. IMPRESSION: CT head: Stable areas of atrophy with patchy periventricular small vessel disease. No evident acute infarct. No mass or  hemorrhage. There are foci of arterial vascular calcification. There is paranasal sinus disease at several sites as well as chronic left-sided mastoid disease. There is nasal septal deviation. CT cervical spine: No evident fracture or spondylolisthesis. There is mild osteoarthritic change at several levels. No nerve root edema or effacement. No disc extrusion or stenosis. There is carotid artery calcification bilaterally. Electronically Signed   By: Bretta Bang III M.D.   On: 02/24/2018 13:45   Ct Abdomen Pelvis W Contrast  Result Date: 02/24/2018 CLINICAL DATA:  Fall 2 days ago. EXAM: CT CHEST, ABDOMEN, AND PELVIS WITH CONTRAST TECHNIQUE: Multidetector CT imaging of the chest, abdomen and pelvis was performed following the standard protocol during bolus administration of intravenous contrast. CONTRAST:  OMNIPAQUE IOHEXOL 300 MG/ML  SOLN COMPARISON:  Chest x-ray from same day. CT abdomen pelvis dated November 07, 2010. FINDINGS: CT CHEST FINDINGS Cardiovascular: Normal heart size. No pericardial effusion. Normal caliber thoracic aorta. No aortic dissection. Aortic arch atherosclerosis. No central pulmonary embolism. Mediastinum/Nodes: No enlarged mediastinal, hilar, or axillary lymph nodes. Thyroid gland, trachea, and esophagus demonstrate no significant findings. Unchanged large hiatal hernia. Lungs/Pleura: No focal consolidation, pleural effusion, or pneumothorax. 6 x 5 mm nodule in the left lower lobe. Minimal bilateral lower lobe subsegmental atelectasis. Scarring in the lingula. Musculoskeletal: Acute to subacute mild T7 and moderate T8 compression deformities with small amount of paravertebral hematoma. Unchanged chronic mild superior endplate compression deformities of T10, T11, and T12. No sternal or rib fracture. CT ABDOMEN PELVIS FINDINGS Hepatobiliary:  Scattered subcentimeter low-density lesions in the liver are too small to characterize. Status post cholecystectomy. No biliary dilatation.  Pancreas: Unremarkable. No pancreatic ductal dilatation or surrounding inflammatory changes. Spleen: Normal in size without focal abnormality. Adrenals/Urinary Tract: The adrenal glands are unremarkable. 1.4 cm simple cyst in the right kidney. Interval enlargement of the simple cyst in the left kidney, now measuring 5.1 cm. No renal or ureteral calculi. No hydronephrosis. The bladder is unremarkable. Stomach/Bowel: Large hiatal hernia containing stomach and the splenic flexure. No evidence of bowel wall thickening, distention, or surrounding inflammatory changes. Normal appendix. Vascular/Lymphatic: Mild aortic atherosclerosis. No enlarged abdominal or pelvic lymph nodes. Reproductive: Uterus and bilateral adnexa are unremarkable. Other: No free fluid or pneumoperitoneum. Musculoskeletal: No acute or significant osseous findings. Unchanged chronic moderate compression fracture of L2. IMPRESSION: 1. Acute to subacute mild T7 and moderate T8 compression fractures. 2. No other evidence of traumatic injury within the chest, abdomen, or pelvis. 3. 6 mm nodule in the left lower lobe. Non-contrast chest CT at 6-12 months is recommended. If the nodule is stable at time of repeat CT, then future CT at 18-24 months (from today's scan) is considered optional for low-risk patients, but is recommended for high-risk patients. This recommendation follows the consensus statement: Guidelines for Management of Incidental Pulmonary Nodules Detected on CT Images: From the Fleischner Society 2017; Radiology 2017; 284:228-243. 4. Unchanged large hiatal hernia containing stomach and the splenic flexure. 5. Chronic compression fractures of T10 through T12 and L2. 6.  Aortic atherosclerosis (ICD10-I70.0). Electronically Signed   By: Obie DredgeWilliam T Derry M.D.   On: 02/24/2018 14:04   Ct T-spine No Charge  Result Date: 02/24/2018 CLINICAL DATA:  Fall 2 days ago. EXAM: CT THORACIC AND LUMBAR SPINE WITHOUT CONTRAST TECHNIQUE: Multidetector CT  imaging of the thoracic and lumbar spine was performed without intravenous contrast. Multiplanar CT image reconstructions were also generated. COMPARISON:  CT abdomen pelvis dated November 07, 2010. FINDINGS: CT THORACIC SPINE FINDINGS Alignment: Mild dextroscoliosis, apex at T7-T8. Sagittal alignment is maintained. Vertebrae: Acute to subacute mild T7 and moderate T8 compression fractures with a small amount of paravertebral hematoma. No retropulsion. Chronic mild superior endplate compression fractures of T10 through T12. Osteopenia. Paraspinal and other soft tissues: Please see separate CT chest report from same day. Disc levels: Disc heights are relatively preserved. No significant spinal canal or neuroforaminal stenosis. CT LUMBAR SPINE FINDINGS Segmentation: 5 lumbar type vertebrae. Alignment: Normal. Vertebrae: No acute fracture. Chronic moderate superior endplate compression fracture of L2 with 2-3 mm retropulsion. Osteopenia. Paraspinal and other soft tissues: Please see separate CT abdomen and pelvis report from same day. Disc levels: Disc heights are relatively preserved. No significant spinal canal or neuroforaminal stenosis. IMPRESSION: 1. Acute to subacute mild T7 and moderate T8 compression fractures. 2. Chronic compression fractures of T10 through T12 and L2. Minimal retropulsion at L2 is unchanged. Electronically Signed   By: Obie DredgeWilliam T Derry M.D.   On: 02/24/2018 14:13   Ct L-spine No Charge  Result Date: 02/24/2018 CLINICAL DATA:  Fall 2 days ago. EXAM: CT THORACIC AND LUMBAR SPINE WITHOUT CONTRAST TECHNIQUE: Multidetector CT imaging of the thoracic and lumbar spine was performed without intravenous contrast. Multiplanar CT image reconstructions were also generated. COMPARISON:  CT abdomen pelvis dated November 07, 2010. FINDINGS: CT THORACIC SPINE FINDINGS Alignment: Mild dextroscoliosis, apex at T7-T8. Sagittal alignment is maintained. Vertebrae: Acute to subacute mild T7 and moderate T8 compression  fractures with a small amount of paravertebral hematoma. No retropulsion. Chronic mild  superior endplate compression fractures of T10 through T12. Osteopenia. Paraspinal and other soft tissues: Please see separate CT chest report from same day. Disc levels: Disc heights are relatively preserved. No significant spinal canal or neuroforaminal stenosis. CT LUMBAR SPINE FINDINGS Segmentation: 5 lumbar type vertebrae. Alignment: Normal. Vertebrae: No acute fracture. Chronic moderate superior endplate compression fracture of L2 with 2-3 mm retropulsion. Osteopenia. Paraspinal and other soft tissues: Please see separate CT abdomen and pelvis report from same day. Disc levels: Disc heights are relatively preserved. No significant spinal canal or neuroforaminal stenosis. IMPRESSION: 1. Acute to subacute mild T7 and moderate T8 compression fractures. 2. Chronic compression fractures of T10 through T12 and L2. Minimal retropulsion at L2 is unchanged. Electronically Signed   By: Obie Dredge M.D.   On: 02/24/2018 14:13    EGD, COLONOSCOPY.    Subjective: Denies any hematochezia or chest pain, sob, or malena.   Discharge Exam: Vitals:   02/25/18 1033 02/25/18 1037  BP: (!) 141/76 (!) 146/84  Pulse: 86 89  Resp: (!) 23 16  Temp:    SpO2: 100% 99%   Vitals:   02/25/18 0857 02/25/18 1027 02/25/18 1033 02/25/18 1037  BP: (!) 189/82 115/60 (!) 141/76 (!) 146/84  Pulse: 89 86 86 89  Resp: 14 (!) 21 (!) 23 16  Temp: 99.1 F (37.3 C) 97.7 F (36.5 C)    TempSrc: Oral Oral    SpO2: 97% 100% 100% 99%  Weight: 69.4 kg (153 lb)     Height: 5\' 4"  (1.626 m)       General: Pt is alert, awake, not in acute distress Cardiovascular: RRR, S1/S2 +, no rubs, no gallops Respiratory: CTA bilaterally, no wheezing, no rhonchi Abdominal: Soft, NT, ND, bowel sounds + Extremities: no edema, no cyanosis    The results of significant diagnostics from this hospitalization (including imaging, microbiology, ancillary  and laboratory) are listed below for reference.     Microbiology: Recent Results (from the past 240 hour(s))  Surgical PCR screen     Status: None   Collection Time: 02/24/18  7:46 PM  Result Value Ref Range Status   MRSA, PCR NEGATIVE NEGATIVE Final   Staphylococcus aureus NEGATIVE NEGATIVE Final    Comment: (NOTE) The Xpert SA Assay (FDA approved for NASAL specimens in patients 58 years of age and older), is one component of a comprehensive surveillance program. It is not intended to diagnose infection nor to guide or monitor treatment. Performed at [facility], [address] [place], [place] [address]      Labs: BNP (last 3 results) No results for input(s): BNP in the last 8760 hours. Basic Metabolic Panel: Recent Labs  Lab 02/24/18 1128 02/25/18 0549  NA 136 145  K 4.1 3.7  CL 100* 113*  CO2 26 22  GLUCOSE 97 86  BUN 16 9  CREATININE 1.01* 0.82  CALCIUM 8.9 8.8*   Liver Function Tests: Recent Labs  Lab 02/24/18 1128  AST 15  ALT 9*  ALKPHOS 72  BILITOT 0.6  PROT 6.2*  ALBUMIN 3.4*   No results for input(s): LIPASE, AMYLASE in the last 168 hours. No results for input(s): AMMONIA in the last 168 hours. CBC: Recent Labs  Lab 02/24/18 1128 02/24/18 1811 02/25/18 0549  WBC 7.8 7.0 5.7  HGB 11.4* 11.2* 11.7*  HCT 37.8 36.8 39.1  MCV 86.5 86.0 87.5  PLT 280 262 248   Cardiac Enzymes: No results for input(s): CKTOTAL, CKMB, CKMBINDEX, TROPONINI in the  last 168 hours. BNP: Invalid input(s): POCBNP CBG: No results for input(s): GLUCAP in the last 168 hours. D-Dimer No results for input(s): DDIMER in the last 72 hours. Hgb A1c No results for input(s): HGBA1C in the last 72 hours. Lipid Profile No results for input(s): CHOL, HDL, LDLCALC, TRIG, CHOLHDL, LDLDIRECT in the last 72 hours. Thyroid function studies Recent Labs    02/24/18 1811  TSH 0.868   Anemia work up No results for input(s): VITAMINB12, FOLATE, FERRITIN, TIBC, IRON,  RETICCTPCT in the last 72 hours. Urinalysis    Component Value Date/Time   COLORURINE YELLOW 10/31/2010 1726   APPEARANCEUR CLEAR 10/31/2010 1726   LABSPEC 1.016 10/31/2010 1726   PHURINE 7.5 10/31/2010 1726   HGBUR SMALL (A) 10/31/2010 1726   BILIRUBINUR NEGATIVE 10/31/2010 1726   KETONESUR NEGATIVE 10/31/2010 1726   PROTEINUR NEGATIVE 10/31/2010 1726   UROBILINOGEN 4.0 (H) 10/31/2010 1726   NITRITE NEGATIVE 10/31/2010 1726   LEUKOCYTESUR NEGATIVE 10/31/2010 1726   Sepsis Labs Invalid input(s): PROCALCITONIN,  WBC,  LACTICIDVEN Microbiology Recent Results (from the past 240 hour(s))  Surgical PCR screen     Status: None   Collection Time: 02/24/18  7:46 PM  Result Value Ref Range Status   MRSA, PCR NEGATIVE NEGATIVE Final   Staphylococcus aureus NEGATIVE NEGATIVE Final    Comment: (NOTE) The Xpert SA Assay (FDA approved for NASAL specimens in patients 82 years of age and older), is one component of a comprehensive surveillance program. It is not intended to diagnose infection nor to guide or monitor treatment. Performed at [facility], [address] [place], [place] [address]      Time coordinating discharge: 32 minutes  SIGNED:   Kathlen Mody, MD  [facility] 02/25/2018, 1:19 PM Pager   If 7PM-7AM, please contact night-coverage [email or web address] Password [email or web address]

## 2018-02-28 ENCOUNTER — Encounter: Payer: Self-pay | Admitting: Physician Assistant

## 2018-03-01 ENCOUNTER — Encounter: Payer: Self-pay | Admitting: Gastroenterology

## 2018-03-03 ENCOUNTER — Other Ambulatory Visit: Payer: Medicare Other

## 2018-03-11 ENCOUNTER — Other Ambulatory Visit: Payer: Self-pay | Admitting: Internal Medicine

## 2018-03-11 DIAGNOSIS — M797 Fibromyalgia: Secondary | ICD-10-CM

## 2018-03-23 ENCOUNTER — Telehealth: Payer: Self-pay | Admitting: *Deleted

## 2018-03-23 NOTE — Telephone Encounter (Signed)
Patient notified

## 2018-03-23 NOTE — Telephone Encounter (Signed)
Patient husband called and stated that patient is transferring her care to [facility]ini at [facility] but her NP appointment isn't until 04/13/18 and she needs refills on her Amitriptyline and Lyrica. Stated that Dr. Laurey MoralePerini's office told them to call here for another refill until they can see her.   I checked the NCCSRS Database and Lyrica was last refilled by Dr. Renato Gailseed on 01/19/2018.   Is it ok to send refills in. Note dated 02/07/18 states patient established 12/12/17.  Please Advise.

## 2018-03-23 NOTE — Telephone Encounter (Signed)
Pt already established with [facility] 12/22/17 and received medications from a provider there (mobic) per a note from our staff when we contacted Dr. Laurey MoralePerini's office to confirm their change in PCP in June.   No more medications will be prescribed from [facility].

## 2018-03-27 ENCOUNTER — Ambulatory Visit: Payer: Medicare Other | Admitting: Physician Assistant

## 2018-04-14 ENCOUNTER — Other Ambulatory Visit: Payer: Medicare Other

## 2018-04-17 ENCOUNTER — Other Ambulatory Visit: Payer: Self-pay | Admitting: Internal Medicine

## 2018-04-17 ENCOUNTER — Other Ambulatory Visit (HOSPITAL_COMMUNITY): Payer: Self-pay | Admitting: Internal Medicine

## 2018-04-17 ENCOUNTER — Other Ambulatory Visit: Payer: Self-pay

## 2018-04-17 DIAGNOSIS — M810 Age-related osteoporosis without current pathological fracture: Secondary | ICD-10-CM

## 2018-04-17 DIAGNOSIS — S22009S Unspecified fracture of unspecified thoracic vertebra, sequela: Secondary | ICD-10-CM

## 2018-04-17 DIAGNOSIS — M542 Cervicalgia: Secondary | ICD-10-CM

## 2018-04-17 DIAGNOSIS — M545 Low back pain, unspecified: Secondary | ICD-10-CM

## 2018-04-17 DIAGNOSIS — M546 Pain in thoracic spine: Secondary | ICD-10-CM

## 2018-04-18 ENCOUNTER — Inpatient Hospital Stay (HOSPITAL_COMMUNITY)
Admission: EM | Admit: 2018-04-18 | Discharge: 2018-04-22 | DRG: 543 | Disposition: A | Discharge status: Skilled Nursing Facility | Payer: Medicare Other | Attending: Internal Medicine | Admitting: Internal Medicine

## 2018-04-18 ENCOUNTER — Ambulatory Visit (HOSPITAL_COMMUNITY): Admission: RE | Admit: 2018-04-18 | Payer: Medicare Other | Source: Ambulatory Visit

## 2018-04-18 ENCOUNTER — Encounter (HOSPITAL_COMMUNITY): Payer: Self-pay

## 2018-04-18 ENCOUNTER — Emergency Department (HOSPITAL_COMMUNITY): Payer: Medicare Other

## 2018-04-18 ENCOUNTER — Other Ambulatory Visit: Payer: Self-pay

## 2018-04-18 DIAGNOSIS — E871 Hypo-osmolality and hyponatremia: Secondary | ICD-10-CM | POA: Diagnosis not present

## 2018-04-18 DIAGNOSIS — R339 Retention of urine, unspecified: Secondary | ICD-10-CM | POA: Diagnosis present

## 2018-04-18 DIAGNOSIS — M549 Dorsalgia, unspecified: Secondary | ICD-10-CM | POA: Diagnosis present

## 2018-04-18 DIAGNOSIS — E039 Hypothyroidism, unspecified: Secondary | ICD-10-CM | POA: Diagnosis present

## 2018-04-18 DIAGNOSIS — M545 Low back pain: Secondary | ICD-10-CM | POA: Diagnosis not present

## 2018-04-18 DIAGNOSIS — M8088XA Other osteoporosis with current pathological fracture, vertebra(e), initial encounter for fracture: Secondary | ICD-10-CM | POA: Diagnosis not present

## 2018-04-18 DIAGNOSIS — M797 Fibromyalgia: Secondary | ICD-10-CM | POA: Diagnosis present

## 2018-04-18 DIAGNOSIS — K5900 Constipation, unspecified: Secondary | ICD-10-CM | POA: Diagnosis present

## 2018-04-18 DIAGNOSIS — F319 Bipolar disorder, unspecified: Secondary | ICD-10-CM | POA: Diagnosis present

## 2018-04-18 DIAGNOSIS — K219 Gastro-esophageal reflux disease without esophagitis: Secondary | ICD-10-CM | POA: Diagnosis present

## 2018-04-18 DIAGNOSIS — F32A Depression, unspecified: Secondary | ICD-10-CM | POA: Diagnosis present

## 2018-04-18 DIAGNOSIS — I10 Essential (primary) hypertension: Secondary | ICD-10-CM | POA: Diagnosis present

## 2018-04-18 DIAGNOSIS — G8929 Other chronic pain: Secondary | ICD-10-CM | POA: Diagnosis present

## 2018-04-18 DIAGNOSIS — F329 Major depressive disorder, single episode, unspecified: Secondary | ICD-10-CM | POA: Diagnosis present

## 2018-04-18 DIAGNOSIS — Z79890 Hormone replacement therapy (postmenopausal): Secondary | ICD-10-CM

## 2018-04-18 LAB — CBC WITH DIFFERENTIAL/PLATELET
Basophils Absolute: 0 10*3/uL (ref 0.0–0.1)
Basophils Relative: 0 %
Eosinophils Absolute: 0.3 10*3/uL (ref 0.0–0.7)
Eosinophils Relative: 5 %
HEMATOCRIT: 34.9 % — AB (ref 36.0–46.0)
HEMOGLOBIN: 10.7 g/dL — AB (ref 12.0–15.0)
Lymphocytes Relative: 27 %
Lymphs Abs: 1.7 10*3/uL (ref 0.7–4.0)
MCH: 25 pg — ABNORMAL LOW (ref 26.0–34.0)
MCHC: 30.7 g/dL (ref 30.0–36.0)
MCV: 81.5 fL (ref 78.0–100.0)
MONO ABS: 0.5 10*3/uL (ref 0.1–1.0)
MONOS PCT: 7 %
NEUTROS ABS: 3.8 10*3/uL (ref 1.7–7.7)
NEUTROS PCT: 61 %
Platelets: 294 10*3/uL (ref 150–400)
RBC: 4.28 MIL/uL (ref 3.87–5.11)
RDW: 16.9 % — AB (ref 11.5–15.5)
WBC: 6.3 10*3/uL (ref 4.0–10.5)

## 2018-04-18 LAB — BASIC METABOLIC PANEL
ANION GAP: 10 (ref 5–15)
BUN: 10 mg/dL (ref 8–23)
CALCIUM: 8.5 mg/dL — AB (ref 8.9–10.3)
CHLORIDE: 103 mmol/L (ref 98–111)
CO2: 27 mmol/L (ref 22–32)
Creatinine, Ser: 0.68 mg/dL (ref 0.44–1.00)
GFR calc non Af Amer: >60 mL/min (ref >60)
GLUCOSE: 99 mg/dL (ref 70–99)
Potassium: 3.8 mmol/L (ref 3.5–5.1)
Sodium: 140 mmol/L (ref 135–145)

## 2018-04-18 MED ORDER — ONDANSETRON HCL 4 MG PO TABS: 4.0000 mg | ORAL_TABLET | Freq: Four times a day (QID) | ORAL | Status: DC | PRN

## 2018-04-18 MED ORDER — FLUOXETINE HCL 20 MG PO CAPS
40.0000 mg | ORAL_CAPSULE | Freq: Every day | ORAL | Status: DC
  Administered 2018-04-18 – 2018-04-22 (×5): 40 mg via ORAL
  Filled 2018-04-18 (×5): qty 2

## 2018-04-18 MED ORDER — PANTOPRAZOLE SODIUM 40 MG PO TBEC
40.0000 mg | DELAYED_RELEASE_TABLET | Freq: Every day | ORAL | Status: DC
  Administered 2018-04-18 – 2018-04-22 (×5): 40 mg via ORAL
  Filled 2018-04-18 (×5): qty 1

## 2018-04-18 MED ORDER — KETOROLAC TROMETHAMINE 15 MG/ML IJ SOLN
15.0000 mg | Freq: Once | INTRAMUSCULAR | Status: AC
  Administered 2018-04-18: 15 mg via INTRAVENOUS
  Filled 2018-04-18: qty 1

## 2018-04-18 MED ORDER — ACETAMINOPHEN 650 MG RE SUPP: 650.0000 mg | Freq: Four times a day (QID) | RECTAL | Status: DC | PRN

## 2018-04-18 MED ORDER — CYCLOBENZAPRINE HCL 5 MG PO TABS
7.5000 mg | ORAL_TABLET | Freq: Every day | ORAL | Status: DC
  Administered 2018-04-18: 7.5 mg via ORAL
  Filled 2018-04-18: qty 2

## 2018-04-18 MED ORDER — HYDROMORPHONE HCL 1 MG/ML IJ SOLN
0.5000 mg | Freq: Once | INTRAMUSCULAR | Status: AC
  Administered 2018-04-18: 0.5 mg via INTRAVENOUS
  Filled 2018-04-18: qty 1

## 2018-04-18 MED ORDER — RISPERIDONE 1 MG PO TABS
1.0000 mg | ORAL_TABLET | Freq: Two times a day (BID) | ORAL | Status: DC
  Administered 2018-04-18 – 2018-04-22 (×9): 1 mg via ORAL
  Filled 2018-04-18 (×9): qty 1

## 2018-04-18 MED ORDER — SENNA 8.6 MG PO TABS: 1.0000 | ORAL_TABLET | Freq: Every day | ORAL | Status: DC | PRN

## 2018-04-18 MED ORDER — HYDROMORPHONE HCL 1 MG/ML IJ SOLN
1.0000 mg | Freq: Once | INTRAMUSCULAR | Status: AC
Start: 2018-04-18 — End: 2018-04-18
  Administered 2018-04-18: 1 mg via INTRAVENOUS
  Filled 2018-04-18: qty 1

## 2018-04-18 MED ORDER — DIAZEPAM 5 MG/ML IJ SOLN
1.0000 mg | Freq: Once | INTRAMUSCULAR | Status: AC
  Administered 2018-04-18: 1 mg via INTRAVENOUS
  Filled 2018-04-18: qty 2

## 2018-04-18 MED ORDER — LEVOTHYROXINE SODIUM 25 MCG PO TABS
25.0000 ug | ORAL_TABLET | Freq: Every day | ORAL | Status: DC
  Administered 2018-04-19 – 2018-04-22 (×4): 25 ug via ORAL
  Filled 2018-04-18 (×4): qty 1

## 2018-04-18 MED ORDER — PREGABALIN 50 MG PO CAPS
150.0000 mg | ORAL_CAPSULE | Freq: Every day | ORAL | Status: DC
  Administered 2018-04-18 – 2018-04-22 (×5): 150 mg via ORAL
  Filled 2018-04-18 (×5): qty 3

## 2018-04-18 MED ORDER — AMITRIPTYLINE HCL 25 MG PO TABS
25.0000 mg | ORAL_TABLET | Freq: Every day | ORAL | Status: DC
  Administered 2018-04-18 – 2018-04-21 (×4): 25 mg via ORAL
  Filled 2018-04-18 (×4): qty 1

## 2018-04-18 MED ORDER — ENOXAPARIN SODIUM 40 MG/0.4ML ~~LOC~~ SOLN
40.0000 mg | SUBCUTANEOUS | Status: DC
  Administered 2018-04-18 – 2018-04-21 (×4): 40 mg via SUBCUTANEOUS
  Filled 2018-04-18 (×4): qty 0.4

## 2018-04-18 MED ORDER — HYDROMORPHONE HCL 1 MG/ML IJ SOLN
0.5000 mg | INTRAMUSCULAR | Status: DC | PRN
  Administered 2018-04-18 – 2018-04-21 (×11): 0.5 mg via INTRAVENOUS
  Filled 2018-04-18 (×12): qty 0.5

## 2018-04-18 MED ORDER — SODIUM CHLORIDE 0.9 % IV BOLUS
1000.0000 mL | Freq: Once | INTRAVENOUS | Status: AC
  Administered 2018-04-18: 1000 mL via INTRAVENOUS

## 2018-04-18 MED ORDER — ONDANSETRON HCL 4 MG/2ML IJ SOLN: 4.0000 mg | Freq: Four times a day (QID) | INTRAMUSCULAR | Status: DC | PRN

## 2018-04-18 MED ORDER — SODIUM CHLORIDE 0.9 % IV SOLN
Freq: Once | INTRAVENOUS | Status: AC
  Administered 2018-04-18: 10:00:00 via INTRAVENOUS

## 2018-04-18 MED ORDER — ACETAMINOPHEN 325 MG PO TABS: 650.0000 mg | ORAL_TABLET | Freq: Four times a day (QID) | ORAL | Status: DC | PRN

## 2018-04-18 NOTE — ED Provider Notes (Addendum)
Assumed care from Dr. Eudelia Bunchardama at 7:11 AM. Briefly, the patient is a [age] y.o. female with PMHx of  has a past medical history of Abnormal involuntary movements(781.0), Alopecia, unspecified, Anemia, unspecified, Anxiety, Bipolar disorder, unspecified (HCC), Depression, Dysuria, Edema, Encounter for long-term (current) use of other medications, Esophageal reflux, Fibromyalgia, Hyperthyroidism, Insomnia, Lumbago, Nonspecific elevation of levels of transaminase or lactic acid dehydrogenase (LDH), Other abnormal blood chemistry, Other malaise and fatigue, Pathologic fracture of vertebrae, Reflux esophagitis, Syncope and collapse, Tachycardia, unspecified, Unspecified essential hypertension, and Unspecified hypothyroidism. here with worsening back pain. H/o multiple T, L spine fx, scheduled for MRI today but sat up quickly and had acute on chronic, worsening back pain. MR scheduled this AM. Pain control given. F/u MRI, dispo.   Labs Reviewed - No data to display  Course of Care: Patient back from MRI.  She is having ongoing pain.  Will give an additional dose of IV analgesia while awaiting MRI results. -MRI shows new T8 fx, T11 SEP fx. Pt with ongoing severe pain. Will d/w NSGY, plan to admit for pain control. -D/w Dr. Dutch QuintPoole, NSGY. Will place Aspen LSO w/ thoracic extender, f/u in 2 weeks.      Shaune PollackIsaacs, Harold Mattes, MD 04/18/18 0900    Shaune PollackIsaacs, Trent Gabler, MD 04/18/18 [phone]

## 2018-04-18 NOTE — ED Notes (Signed)
Bed: [id number] Expected date:  Expected time:  Means of arrival:  Comments: 72 yr old lower back pain

## 2018-04-18 NOTE — ED Notes (Signed)
Patient transported to MRI 

## 2018-04-18 NOTE — ED Provider Notes (Signed)
[facility]-EMERGENCY DEPT Provider Note  CSN: [id number] Arrival date & time: 04/18/18 [id number]  Chief Complaint(s) Back Pain  HPI Sarah Miranda is a 72 y.o. female with a history of osteoporosis and back pain due to acute to subacute T7, T8 compression fractures as well as chronic compression fractures of T10-T12 and L2 with minimal retropulsion and L2 who is scheduled for an MRI at 0630 this morning presents to the emergency department with acute exacerbation of her back pain. Reports that her husband fell out of bed this am and patient sat up quickly, causing her to exacerbate her back pain in the lumbar region, which she describes as aching with sharp shooting components.  Her pain is exacerbated with movement.  Alleviated by being still.  Patient has tried taking home narcotic medication with minimal relief.  Due to the severity of her pain and inability to move due to the pain, EMS was called.  They provided additional fentanyl with minimal relief.  She denies any additional trauma.  No lower extremity weakness or loss of sensation.  No bladder/bowel incontinence.  HPI  .   Past Medical History Past Medical History:  Diagnosis Date  . Abnormal involuntary movements(781.0)   . Alopecia, unspecified   . Anemia, unspecified   . Anxiety   . Bipolar disorder, unspecified (HCC)   . Depression   . Dysuria   . Edema   . Encounter for long-term (current) use of other medications   . Esophageal reflux   . Fibromyalgia   . Hyperthyroidism   . Insomnia   . Lumbago   . Nonspecific elevation of levels of transaminase or lactic acid dehydrogenase (LDH)   . Other abnormal blood chemistry    hyperglycemia  . Other malaise and fatigue   . Pathologic fracture of vertebrae   . Reflux esophagitis   . Syncope and collapse   . Tachycardia, unspecified   . Unspecified essential hypertension   . Unspecified hypothyroidism    Patient Active Problem List   Diagnosis Date Noted   . Iron deficiency anemia   . Gastroesophageal reflux disease with esophagitis   . Hematochezia 02/24/2018  . Compression fracture of T7 vertebra (HCC) 02/24/2018  . Tachycardia 02/24/2018  . Uncomplicated opioid dependence (HCC) 09/25/2017  . Symptomatic anemia 05/20/2017  . Nausea without vomiting 06/23/2016  . Corn 06/23/2016  . Callus 06/02/2014  . Hyponatremia 01/22/2014  . Depression   . Insomnia   . Fibromyalgia   . Anxiety   . Hypothyroidism   . Hyperglycemia   . Malaise   . Bipolar disorder (HCC)   . Edema   . Essential hypertension    Home Medication(s) Prior to Admission medications   Medication Sig Start Date End Date Taking? Authorizing Provider  acetaminophen (TYLENOL) 500 MG tablet Take 1,000 mg by mouth as needed for mild pain.   Yes [provider]  amitriptyline (ELAVIL) 25 MG tablet TAKE 1 TABLET(25 MG) BY MOUTH AT BEDTIME Patient taking differently: Take 25 mg by mouth at bedtime.  01/31/18  Yes Reed, Tiffany L, DO  FLUoxetine (PROZAC) 40 MG capsule TAKE 1 CAPSULE BY MOUTH DAILY TO HELP NERVES AND DEPRESSION Patient taking differently: Take 40 mg by mouth daily.  01/12/18  Yes Reed, Tiffany L, DO  levothyroxine (SYNTHROID, LEVOTHROID) 25 MCG tablet TAKE 1 TABLET BY MOUTH EVERY DAY 01/05/18  Yes Reed, Tiffany L, DO  Menthol-Methyl Salicylate (MUSCLE RUB) 10-15 % CREA Apply 1 application topically as needed for muscle  pain.   Yes [provider]  Multiple Vitamin (MULTIVITAMIN WITH MINERALS) TABS tablet Take 1 tablet by mouth daily.   Yes [provider]  ondansetron (ZOFRAN) 4 MG tablet TAKE 1 TABLET BY MOUTH EVERY 8 HOURS AS NEEDED FOR NAUSEA 09/12/17  Yes Reed, Tiffany L, DO  pantoprazole (PROTONIX) 40 MG tablet Take 1 tablet (40 mg total) by mouth daily. 02/26/18  Yes Kathlen Mody, MD  pregabalin (LYRICA) 150 MG capsule Take 1 capsule (150 mg total) by mouth daily. 01/19/18  Yes Reed, Tiffany L, DO  risperiDONE (RISPERDAL) 1 MG tablet TAKE  1 TABLET BY MOUTH TWICE DAILY TO HELP RELAX Patient taking differently: Take 1 mg by mouth 2 (two) times daily.  06/13/17  Yes Reed, Tiffany L, DO  senna (SENOKOT) 8.6 MG TABS tablet Take 1 tablet (8.6 mg total) by mouth daily as needed for mild constipation. 05/19/17  Yes Kermit Balo, DO                                                                                                                                    Past Surgical History Past Surgical History:  Procedure Laterality Date  . BIOPSY  02/25/2018   Procedure: BIOPSY;  Surgeon: Meryl Dare, MD;  Location: [facility] ENDOSCOPY;  Service: Endoscopy;;  . CHOLECYSTECTOMY  10/2010   complicated by bile leak  . COLONOSCOPY WITH PROPOFOL N/A 02/25/2018   Procedure: COLONOSCOPY WITH PROPOFOL;  Surgeon: Meryl Dare, MD;  Location: [facility] ENDOSCOPY;  Service: Endoscopy;  Laterality: N/A;  . ERCP  11/03/2010   for bile leak  . ESOPHAGOGASTRODUODENOSCOPY (EGD) WITH PROPOFOL N/A 02/25/2018   Procedure: ESOPHAGOGASTRODUODENOSCOPY (EGD) WITH PROPOFOL;  Surgeon: Meryl Dare, MD;  Location: [facility] ENDOSCOPY;  Service: Endoscopy;  Laterality: N/A;  . LAPAROSCOPIC TUBAL LIGATION    . TONSILLECTOMY     Family History Family History  Problem Relation Age of Onset  . Cancer Father        prostate  . Colon cancer Neg Hx     Social History Social History   Tobacco Use  . Smoking status: Never Smoker  . Smokeless tobacco: Never Used  Substance Use Topics  . Alcohol use: No    Alcohol/week: 0.0 standard drinks  . Drug use: No   Allergies Fetzima [levomilnacipran]  Review of Systems Review of Systems All other systems are reviewed and are negative for acute change except as noted in the HPI  Physical Exam Vital Signs  I have reviewed the triage vital signs BP (!) 157/85 (BP Location: Right Arm)   Pulse (!) 108   Temp (!) 97.5 F (36.4 C) (Oral)   Resp 16   Ht 5\' 4"  (1.626 m)   Wt 70.8 kg   SpO2 96% Comment: Simultaneous filing.  User may not have seen previous data.  BMI 26.78 kg/m   Physical Exam  Constitutional: She is oriented to person,  place, and time. She appears well-developed and well-nourished. No distress.  HENT:  Head: Normocephalic and atraumatic.  Right Ear: External ear normal.  Left Ear: External ear normal.  Nose: Nose normal.  Eyes: Conjunctivae and EOM are normal. No scleral icterus.  Neck: Normal range of motion and phonation normal.  Cardiovascular: Normal rate and regular rhythm.  Pulmonary/Chest: Effort normal. No stridor. No respiratory distress.  Abdominal: She exhibits no distension.  Musculoskeletal: Normal range of motion. She exhibits no edema.       Lumbar back: She exhibits no tenderness, no bony tenderness and no spasm.  Neurological: She is alert and oriented to person, place, and time.  Spine Exam: Strength: 5/5 throughout LE bilaterally  Sensation: Intact to light touch in proximal and distal LE bilaterally Reflexes: 1+ quadriceps and achilles reflexes   Skin: She is not diaphoretic.  Psychiatric: She has a normal mood and affect. Her behavior is normal.  Vitals reviewed.   ED Results and Treatments Labs (all labs ordered are listed, but only abnormal results are displayed) Labs Reviewed  BASIC METABOLIC PANEL  CBC WITH DIFFERENTIAL/PLATELET                                                                                                                         EKG  EKG Interpretation  Date/Time:    Ventricular Rate:    PR Interval:    QRS Duration:   QT Interval:    QTC Calculation:   R Axis:     Text Interpretation:        Radiology No results found. Pertinent labs & imaging results that were available during my care of the patient were reviewed by me and considered in my medical decision making (see chart for details).  Medications Ordered in ED Medications  ketorolac (TORADOL) 15 MG/ML injection 15 mg (15 mg Intravenous Given 04/18/18 0449)  diazepam  (VALIUM) injection 1 mg (1 mg Intravenous Given 04/18/18 0450)  sodium chloride 0.9 % bolus 1,000 mL (0 mLs Intravenous Stopped 04/18/18 0556)  diazepam (VALIUM) injection 1 mg (1 mg Intravenous Given 04/18/18 0605)  HYDROmorphone (DILAUDID) injection 1 mg (1 mg Intravenous Given 04/18/18 0604)                                                                                                                                    Procedures Procedures  (including critical care time)  Medical Decision Making / ED Course I have reviewed the nursing notes for this encounter and the patient's prior records (if available in EHR or on provided paperwork).    Exacerbation of patient's chronic back pain.  Not secondary to trauma.  On physical exam, no evidence suspicious for cauda equina however patient is already scheduled to have MRI this morning.  Provided with IV pain medicine and muscle relaxers.  Will obtain the MRI she was already scheduled to have.  Patient care turned over to Dr Erma HeritageIsaacs at 0730. Patient case and results discussed in detail; please see their note for further ED managment.      Final Clinical Impression(s) / ED Diagnoses Final diagnoses:  Chronic bilateral low back pain without sciatica      This chart was dictated using voice recognition software.  Despite best efforts to proofread,  errors can occur which can change the documentation meaning.   Nira Connardama, Ritika Hellickson Eduardo, MD 04/18/18 [id number]

## 2018-04-18 NOTE — H&P (Addendum)
History and Physical    Sarah Miranda ZOX:[id number] DOB: 05-14-1946 DOA: 04/18/2018  PCP: Rodrigo Ran, MD   Patient coming from: Home  Chief Complaint: Back pain  HPI: Sarah Miranda is a 72 y.o. female with medical history significant for chronic back pain with multiple vertebral spine fractures, fibromyalgia, bipolar disorder and depression who presented to the ED today via EMS with complaints of sudden worsening back pain.  Patient was sleeping when she had her husband for of the bed (he is ok), she is start up suddenly from sleep, and had sudden back pain.  Pain so severe, she was unable to move.  EMS was called.  She denies weakness of her extremities. Pain does not radiate down her legs.  Does not think she has bowel problems, denies urinary problems.  Patient was already planned for an MRI this morning.  Patient tells me she is scheduled for DEXA scan, with plans to start some medications soon considering multiple fractures over the past the.  She has kept up with her screening colonoscopy mammography and Pap smears.  No weight loss.  ED Course: Initial tachycardia 108 hypertensive to 160s.  Unremarkable CBC BMP.  MRI lumbar and thoracic spine-T11 fracture acute or subacute, new mild compression fracture of L1, also chronic L1 unchanged fracture, progressed moderate compression fracture T8, chronic compression fractures T7, T10, T12, neurosurgery- Dr. Dutch Quint, was consulted recommended Aspen LSO w/ thoracic extender follow-up in 2 weeks.  Improved with Dilaudid and Toradol.  Hospitalist called to admit.   Review of Systems: As per HPI all other systems reviewed and negative.  Past Medical History:  Diagnosis Date  . Abnormal involuntary movements(781.0)   . Alopecia, unspecified   . Anemia, unspecified   . Anxiety   . Bipolar disorder, unspecified (HCC)   . Depression   . Dysuria   . Edema   . Encounter for long-term (current) use of other medications   . Esophageal reflux   .  Fibromyalgia   . Hyperthyroidism   . Insomnia   . Lumbago   . Nonspecific elevation of levels of transaminase or lactic acid dehydrogenase (LDH)   . Other abnormal blood chemistry    hyperglycemia  . Other malaise and fatigue   . Pathologic fracture of vertebrae   . Reflux esophagitis   . Syncope and collapse   . Tachycardia, unspecified   . Unspecified essential hypertension   . Unspecified hypothyroidism     Past Surgical History:  Procedure Laterality Date  . BIOPSY  02/25/2018   Procedure: BIOPSY;  Surgeon: Meryl Dare, MD;  Location: [facility] ENDOSCOPY;  Service: Endoscopy;;  . CHOLECYSTECTOMY  10/2010   complicated by bile leak  . COLONOSCOPY WITH PROPOFOL N/A 02/25/2018   Procedure: COLONOSCOPY WITH PROPOFOL;  Surgeon: Meryl Dare, MD;  Location: [facility] ENDOSCOPY;  Service: Endoscopy;  Laterality: N/A;  . ERCP  11/03/2010   for bile leak  . ESOPHAGOGASTRODUODENOSCOPY (EGD) WITH PROPOFOL N/A 02/25/2018   Procedure: ESOPHAGOGASTRODUODENOSCOPY (EGD) WITH PROPOFOL;  Surgeon: Meryl Dare, MD;  Location: [facility] ENDOSCOPY;  Service: Endoscopy;  Laterality: N/A;  . LAPAROSCOPIC TUBAL LIGATION    . TONSILLECTOMY       reports that she has never smoked. She has never used smokeless tobacco. She reports that she does not drink alcohol or use drugs.  Allergies  Allergen Reactions  . Fetzima [Levomilnacipran] Nausea Only    Family History  Problem Relation Age of Onset  . Cancer Father  prostate  . Colon cancer Neg Hx      Prior to Admission medications   Medication Sig Start Date End Date Taking? Authorizing Provider  acetaminophen (TYLENOL) 500 MG tablet Take 1,000 mg by mouth as needed for mild pain.   Yes [provider]  amitriptyline (ELAVIL) 25 MG tablet TAKE 1 TABLET(25 MG) BY MOUTH AT BEDTIME Patient taking differently: Take 25 mg by mouth at bedtime.  01/31/18  Yes Reed, Tiffany L, DO  FLUoxetine (PROZAC) 40 MG capsule TAKE 1 CAPSULE BY MOUTH DAILY TO  HELP NERVES AND DEPRESSION Patient taking differently: Take 40 mg by mouth daily.  01/12/18  Yes Reed, Tiffany L, DO  levothyroxine (SYNTHROID, LEVOTHROID) 25 MCG tablet TAKE 1 TABLET BY MOUTH EVERY DAY 01/05/18  Yes Reed, Tiffany L, DO  Menthol-Methyl Salicylate (MUSCLE RUB) 10-15 % CREA Apply 1 application topically as needed for muscle pain.   Yes [provider]  Multiple Vitamin (MULTIVITAMIN WITH MINERALS) TABS tablet Take 1 tablet by mouth daily.   Yes [provider]  ondansetron (ZOFRAN) 4 MG tablet TAKE 1 TABLET BY MOUTH EVERY 8 HOURS AS NEEDED FOR NAUSEA 09/12/17  Yes Reed, Tiffany L, DO  pantoprazole (PROTONIX) 40 MG tablet Take 1 tablet (40 mg total) by mouth daily. 02/26/18  Yes Kathlen ModyAkula, Vijaya, MD  pregabalin (LYRICA) 150 MG capsule Take 1 capsule (150 mg total) by mouth daily. 01/19/18  Yes Reed, Tiffany L, DO  risperiDONE (RISPERDAL) 1 MG tablet TAKE 1 TABLET BY MOUTH TWICE DAILY TO HELP RELAX Patient taking differently: Take 1 mg by mouth 2 (two) times daily.  06/13/17  Yes Reed, Tiffany L, DO  senna (SENOKOT) 8.6 MG TABS tablet Take 1 tablet (8.6 mg total) by mouth daily as needed for mild constipation. 05/19/17  Yes Kermit Baloeed, Tiffany L, DO    Physical Exam: Vitals:   04/18/18 0430 04/18/18 0500 04/18/18 0608 04/18/18 0843  BP: (!) 163/89 135/71 (!) 160/69 (!) 142/82  Pulse:   94 97  Resp:  17 16 16   Temp:      TempSrc:      SpO2:  90% 97% 97%  Weight:      Height:        Constitutional: NAD, calm,now comfortable Vitals:   04/18/18 0430 04/18/18 0500 04/18/18 0608 04/18/18 0843  BP: (!) 163/89 135/71 (!) 160/69 (!) 142/82  Pulse:   94 97  Resp:  17 16 16   Temp:      TempSrc:      SpO2:  90% 97% 97%  Weight:      Height:       Eyes: PERRL, lids and conjunctivae normal ENMT: Mucous membranes are moist. Posterior pharynx clear of any exudate or lesions.  Neck: normal, supple, no masses, no thyromegaly Respiratory: anterior auscultation,- clear to  auscultation bilaterally, no wheezing, no crackles. Normal respiratory effort. No accessory muscle use.  Cardiovascular: Regular rate and rhythm, no murmurs / rubs / gallops. No extremity edema. 2+ pedal pulses.  Abdomen: no tenderness, no masses palpated. No hepatosplenomegaly. Bowel sounds positive.  Musculoskeletal: no clubbing / cyanosis. No joint deformity upper and lower extremities. Good ROM, no contractures. Normal muscle tone.  Skin: no rashes, lesions, ulcers. No induration Neurologic: CN 2-12 grossly intact. Sensation intact, upper extremities 5/5, lower extremity strength limited by back pain, but able to move lower extremities. Psychiatric: Normal judgment and insight. Alert and oriented x 3. Normal mood.   Labs on Admission: I have personally reviewed following labs  and imaging studies  CBC: Recent Labs  Lab 04/18/18 0712  WBC 6.3  NEUTROABS 3.8  HGB 10.7*  HCT 34.9*  MCV 81.5  PLT 294   Basic Metabolic Panel: Recent Labs  Lab 04/18/18 0712  NA 140  K 3.8  CL 103  CO2 27  GLUCOSE 99  BUN 10  CREATININE 0.68  CALCIUM 8.5*    Radiological Exams on Admission: Mr Thoracic Spine Wo Contrast  Result Date: 04/18/2018 CLINICAL DATA:  Back pain.  History of fractures. EXAM: MRI THORACIC AND LUMBAR SPINE WITHOUT CONTRAST TECHNIQUE: Multiplanar and multiecho pulse sequences of the thoracic and lumbar spine were obtained without intravenous contrast. COMPARISON:  CT chest 02/24/2018.  CT lumbar 02/24/2018 FINDINGS: MRI THORACIC SPINE FINDINGS Alignment:  Moderate dextroscoliosis.  Normal sagittal alignment. Vertebrae: Multiple fractures appear benign. Mild chronic fracture T7 unchanged Moderate fracture of T8 with bone marrow edema. This appears to have progressed in the interval Mild compression fracture T10 appears chronic Extensive edema in the inferior endplate of T11 with associative fracture which has developed since the prior study. Superior endplate fracture [id number] is  unchanged and chronic. T12 compression fracture is chronic and unchanged Cord:  Negative for cord compression.  Cord signal is normal. Paraspinal and other soft tissues: Negative Disc levels: Negative for spinal stenosis. Negative for disc protrusion. Mild disc degeneration is present at T8-9 MRI LUMBAR SPINE FINDINGS Segmentation:  Normal Alignment:  Normal Vertebrae: Mild compression fracture of L1 with bone marrow edema has developed since the prior CT and therefore appears to be acute or subacute. Chronic fracture L1 is moderately severe and unchanged. Conus medullaris and cauda equina: Conus extends to the L1-2 level. Conus and cauda equina appear normal. Paraspinal and other soft tissues: Negative for paraspinous mass. Left renal cyst. Markedly distended urinary bladder. Disc levels: T12-L1: Negative for stenosis L1-2: Mild bony retropulsion of L2 into the canal without significant stenosis L2-3: Mild disc bulging without stenosis. L3-4: Mild disc degeneration. Small annular tear on the left without significant disc protrusion or stenosis L4-5: Mild disc degeneration without stenosis. L5-S1: Negative IMPRESSION: MR THORACIC SPINE IMPRESSION Moderate compression fracture T8 with bone marrow edema. This appears to have progressed in the interval since the prior CT of 02/24/2018. Interval development of inferior endplate fracture [id number] with associated edema suggesting acute or subacute fracture. Chronic compression fractures of T7, T10, T12 MR LUMBAR SPINE IMPRESSION Mild compression fracture of L1 with bone marrow edema which has developed since the prior CT. Chronic fracture L1 unchanged Markedly distended urinary bladder.  Recommend Foley catheter Electronically Signed   By: Marlan Palauharles  Clark M.D.   On: 04/18/2018 08:40   Mr Lumbar Spine Wo Contrast  Result Date: 04/18/2018 CLINICAL DATA:  Back pain.  History of fractures. EXAM: MRI THORACIC AND LUMBAR SPINE WITHOUT CONTRAST TECHNIQUE: Multiplanar and multiecho  pulse sequences of the thoracic and lumbar spine were obtained without intravenous contrast. COMPARISON:  CT chest 02/24/2018.  CT lumbar 02/24/2018 FINDINGS: MRI THORACIC SPINE FINDINGS Alignment:  Moderate dextroscoliosis.  Normal sagittal alignment. Vertebrae: Multiple fractures appear benign. Mild chronic fracture T7 unchanged Moderate fracture of T8 with bone marrow edema. This appears to have progressed in the interval Mild compression fracture T10 appears chronic Extensive edema in the inferior endplate of T11 with associative fracture which has developed since the prior study. Superior endplate fracture E4511 is unchanged and chronic. T12 compression fracture is chronic and unchanged Cord:  Negative for cord compression.  Cord signal is normal. Paraspinal and  other soft tissues: Negative Disc levels: Negative for spinal stenosis. Negative for disc protrusion. Mild disc degeneration is present at T8-9 MRI LUMBAR SPINE FINDINGS Segmentation:  Normal Alignment:  Normal Vertebrae: Mild compression fracture of L1 with bone marrow edema has developed since the prior CT and therefore appears to be acute or subacute. Chronic fracture L1 is moderately severe and unchanged. Conus medullaris and cauda equina: Conus extends to the L1-2 level. Conus and cauda equina appear normal. Paraspinal and other soft tissues: Negative for paraspinous mass. Left renal cyst. Markedly distended urinary bladder. Disc levels: T12-L1: Negative for stenosis L1-2: Mild bony retropulsion of L2 into the canal without significant stenosis L2-3: Mild disc bulging without stenosis. L3-4: Mild disc degeneration. Small annular tear on the left without significant disc protrusion or stenosis L4-5: Mild disc degeneration without stenosis. L5-S1: Negative IMPRESSION: MR THORACIC SPINE IMPRESSION Moderate compression fracture T8 with bone marrow edema. This appears to have progressed in the interval since the prior CT of 02/24/2018. Interval  development of inferior endplate fracture Z36 with associated edema suggesting acute or subacute fracture. Chronic compression fractures of T7, T10, T12 MR LUMBAR SPINE IMPRESSION Mild compression fracture of L1 with bone marrow edema which has developed since the prior CT. Chronic fracture L1 unchanged Markedly distended urinary bladder.  Recommend Foley catheter Electronically Signed   By: Marlan Palau M.D.   On: 04/18/2018 08:40    EKG: None.   Assessment/Plan Active Problems:   Depression   Fibromyalgia   Hypothyroidism   Bipolar disorder (HCC)   Essential hypertension   Acute back pain   Acute on chronic vertebral fractures-multiple fractures.  Likely from osteoporosis, doubt pathologic fractures at this time.  MRI thoracic and lumbar spine - moderate compression fracture T8- progressed, Interval development of inferior endplate fracture U44- acute or subacute fracture. Chronic compression fractures of T7, T10, T12, Mild compression fracture of L1- which has developed, Chronic fracture L1 unchanged. Patient able to void on her own.  No radiculopathy or lower extremity weakness.  Reports she has kept up with her routine screenings. -Neurosurgeon, Dr. Dutch Quint, consulted in ED- Aspen LSO w/ thoracic extender, f/u in 2 weeks. -IV Dilaudid 0.5 every 3 hours as needed, switch to PO in a.m - Flexeril 7.5 QHS (limiting dose as she is other antipsychotic/psychoactive meds) -PT evaluation, if assistive device needed -Patient needs to follow-up with DEXA scan's, and likely start bisphosphonates or require more aggressive therapy -Would avoid NSAIDs at this time, recent admission 6/21- 6/22 for hematochezia -Continue Lyrica and amitriptyline  Hypothyroidism-last TSH 02/24/2018 - 0.86. - continue Synthroid  BPD-  -Continue Lyrica amitriptyline risperidone Prozac  Fibromyalgia -Continue Lyrica and amitriptyline  HTN-blood pressure 130s to 160s.  Not on antihypertensives. -Monitor  Recent  hematochezia-admission 6/21-6/22-EGD results- LA Grade B reflux esophagitis, Barrett's esophagus (biopsy consistent). Colonoscopy revealed -Congested, erythematous and granular mucosa in the descending colon ( biopsy- benign). Recommended discontinue NSAIDs, cont Protonix.  DVT prophylaxis: Lovenox Code Status: Full Family Communication: None at bedside Disposition Plan:  1-2 days, when pain controlled Consults called: Neurosurgery Admission status: obs, med surg   Onnie Boer MD [facility] Pager [phone] From 7AM-7PM.  Otherwise please contact night-coverage [email or web address] Password [facility]  04/18/2018, 10:35 AM

## 2018-04-18 NOTE — ED Notes (Signed)
Back brace left my [employer] employee at bedside, and will be transported with pt upstairs

## 2018-04-18 NOTE — ED Notes (Signed)
[employer] called to update pt's room number to [id number]

## 2018-04-18 NOTE — Progress Notes (Signed)
Brace applied for patient by [employer].  Patient initially had refused numerous times.  After brace applied patient seems to be feeling much better.

## 2018-04-18 NOTE — ED Triage Notes (Signed)
Pt arrived via [facility] due to lumbar pain, Pt states she has problems with her disc and is scheduled for an MRI this morning. Pt sat up in bed and back caught, now unable to move without severe pain. Per pt took 2 hydrocodone and 5mg  oxy with no relief. Given 100mcg of fentanyl en route

## 2018-04-18 NOTE — Care Management Note (Signed)
Case Management Note  Patient Details  Name: Sarah Miranda MRN: [id number] Date of Birth: September 11, 1945  Subjective/Objective:                  Severe back pain unable to ambulate due to pain/arrived per the ems after being awakened from sleep by severe lower back pain/ hx-chronic back pain/ MRI lumbar and thoracic spine-T11 fracture acute or subacute, new mild compression fracture of L1, also chronic L1 unchanged fracture, progressed moderate compression fracture T8, chronic compression fractures T7, T10, T12, Action/Plan: Will follow for cm needs patient was placed in Aspen lso w/ thoracic extender brace/see by neuro/f/u in 2 weeks  Expected Discharge Date:  (unknown)               Expected Discharge Plan:  Home/Self Care  In-House Referral:     Discharge planning Services  CM Consult  Post Acute Care Choice:    Choice offered to:     DME Arranged:    DME Agency:     HH Arranged:    HH Agency:     Status of Service:  In process, will continue to follow  If discussed at Long Length of Stay Meetings, dates discussed:    Additional Comments:  Golda AcreDavis, Rhonda Lynn, RN 04/18/2018, 1:08 PM

## 2018-04-18 NOTE — ED Notes (Signed)
ED TO INPATIENT HANDOFF REPORT  Name/Age/Gender Sarah Miranda 72 y.o. female  Code Status Code Status History    Date Active Date Inactive Code Status Order ID Comments User Context   02/24/2018 1521 02/25/2018 1817 Full Code [id number]  Radene Gunning, NP ED   05/20/2017 1519 05/21/2017 1333 Full Code [id number]  Caren Griffins, MD Inpatient    Advance Directive Documentation     Most Recent Value  Type of Advance Directive  Healthcare Power of Attorney, Living will  Pre-existing out of facility DNR order (yellow form or pink MOST form)  -  "MOST" Form in Place?  -      Home/SNF/Other Home  Chief Complaint Back Pain  Level of Care/Admitting Diagnosis ED Disposition    ED Disposition Condition [place]: [facility] [100102]  Level of Care: Med-Surg [16]  Diagnosis: Acute back pain [564332]  Admitting Physician: Bethena Roys [[id number]]  Attending Physician: Bethena Roys Nessa.Cuff  PT Class (Do Not Modify): Observation [104]  PT Acc Code (Do Not Modify): Observation [10022]       Medical History Past Medical History:  Diagnosis Date  . Abnormal involuntary movements(781.0)   . Alopecia, unspecified   . Anemia, unspecified   . Anxiety   . Bipolar disorder, unspecified ([place])   . Depression   . Dysuria   . Edema   . Encounter for long-term (current) use of other medications   . Esophageal reflux   . Fibromyalgia   . Hyperthyroidism   . Insomnia   . Lumbago   . Nonspecific elevation of levels of transaminase or lactic acid dehydrogenase (LDH)   . Other abnormal blood chemistry    hyperglycemia  . Other malaise and fatigue   . Pathologic fracture of vertebrae   . Reflux esophagitis   . Syncope and collapse   . Tachycardia, unspecified   . Unspecified essential hypertension   . Unspecified hypothyroidism     Allergies Allergies  Allergen Reactions  . Fetzima [Levomilnacipran] Nausea Only    IV  Location/Drains/Wounds Patient Lines/Drains/Airways Status   Active Line/Drains/Airways    Name:   Placement date:   Placement time:   Site:   Days:   Peripheral IV 02/24/18 Left;Upper Arm   02/24/18    2132    Arm   53   Peripheral IV 04/18/18 Right Antecubital   04/18/18    0352    Antecubital   less than 1          Labs/Imaging Results for orders placed or performed during the hospital encounter of 04/18/18 (from the past 48 hour(s))  Basic metabolic panel     Status: Abnormal   Collection Time: 04/18/18  7:12 AM  Result Value Ref Range   Sodium 140 135 - 145 mmol/L   Potassium 3.8 3.5 - 5.1 mmol/L   Chloride 103 98 - 111 mmol/L   CO2 27 22 - 32 mmol/L   Glucose, Bld 99 70 - 99 mg/dL   BUN 10 8 - 23 mg/dL   Creatinine, Ser 0.68 0.44 - 1.00 mg/dL   Calcium 8.5 (L) 8.9 - 10.3 mg/dL   GFR calc non Af Amer >60 >60 mL/min   GFR calc Af Amer >60 >60 mL/min    Comment: (NOTE) The eGFR has been calculated using the CKD EPI equation. This calculation has not been validated in all clinical situations. eGFR's persistently <60 mL/min signify possible Chronic Kidney Disease.  Anion gap 10 5 - 15    Comment: Performed at [facility], [place] [address], [place], [place] [address]  CBC with Differential     Status: Abnormal   Collection Time: 04/18/18  7:12 AM  Result Value Ref Range   WBC 6.3 4.0 - 10.5 K/uL   RBC 4.28 3.87 - 5.11 MIL/uL   Hemoglobin 10.7 (L) 12.0 - 15.0 g/dL   HCT 34.9 (L) 36.0 - 46.0 %   MCV 81.5 78.0 - 100.0 fL   MCH 25.0 (L) 26.0 - 34.0 pg   MCHC 30.7 30.0 - 36.0 g/dL   RDW 16.9 (H) 11.5 - 15.5 %   Platelets 294 150 - 400 K/uL   Neutrophils Relative % 61 %   Neutro Abs 3.8 1.7 - 7.7 K/uL   Lymphocytes Relative 27 %   Lymphs Abs 1.7 0.7 - 4.0 K/uL   Monocytes Relative 7 %   Monocytes Absolute 0.5 0.1 - 1.0 K/uL   Eosinophils Relative 5 %   Eosinophils Absolute 0.3 0.0 - 0.7 K/uL   Basophils Relative 0 %   Basophils Absolute 0.0 0.0 -  0.1 K/uL    Comment: Performed at [facility], [place] [address], [place], [place] [address]   Mr Thoracic Spine Wo Contrast  Result Date: 04/18/2018 CLINICAL DATA:  Back pain.  History of fractures. EXAM: MRI THORACIC AND LUMBAR SPINE WITHOUT CONTRAST TECHNIQUE: Multiplanar and multiecho pulse sequences of the thoracic and lumbar spine were obtained without intravenous contrast. COMPARISON:  CT chest 02/24/2018.  CT lumbar 02/24/2018 FINDINGS: MRI THORACIC SPINE FINDINGS Alignment:  Moderate dextroscoliosis.  Normal sagittal alignment. Vertebrae: Multiple fractures appear benign. Mild chronic fracture T7 unchanged Moderate fracture of T8 with bone marrow edema. This appears to have progressed in the interval Mild compression fracture T10 appears chronic Extensive edema in the inferior endplate of P32 with associative fracture which has developed since the prior study. Superior endplate fracture R51 is unchanged and chronic. T12 compression fracture is chronic and unchanged Cord:  Negative for cord compression.  Cord signal is normal. Paraspinal and other soft tissues: Negative Disc levels: Negative for spinal stenosis. Negative for disc protrusion. Mild disc degeneration is present at T8-9 MRI LUMBAR SPINE FINDINGS Segmentation:  Normal Alignment:  Normal Vertebrae: Mild compression fracture of L1 with bone marrow edema has developed since the prior CT and therefore appears to be acute or subacute. Chronic fracture L1 is moderately severe and unchanged. Conus medullaris and cauda equina: Conus extends to the L1-2 level. Conus and cauda equina appear normal. Paraspinal and other soft tissues: Negative for paraspinous mass. Left renal cyst. Markedly distended urinary bladder. Disc levels: T12-L1: Negative for stenosis L1-2: Mild bony retropulsion of L2 into the canal without significant stenosis L2-3: Mild disc bulging without stenosis. L3-4: Mild disc degeneration. Small annular tear on the  left without significant disc protrusion or stenosis L4-5: Mild disc degeneration without stenosis. L5-S1: Negative IMPRESSION: MR THORACIC SPINE IMPRESSION Moderate compression fracture T8 with bone marrow edema. This appears to have progressed in the interval since the prior CT of 02/24/2018. Interval development of inferior endplate fracture O84 with associated edema suggesting acute or subacute fracture. Chronic compression fractures of T7, T10, T12 MR LUMBAR SPINE IMPRESSION Mild compression fracture of L1 with bone marrow edema which has developed since the prior CT. Chronic fracture L1 unchanged Markedly distended urinary bladder.  Recommend Foley catheter Electronically Signed   By: Franchot Gallo M.D.   On: 04/18/2018  08:40   Mr Lumbar Spine Wo Contrast  Result Date: 04/18/2018 CLINICAL DATA:  Back pain.  History of fractures. EXAM: MRI THORACIC AND LUMBAR SPINE WITHOUT CONTRAST TECHNIQUE: Multiplanar and multiecho pulse sequences of the thoracic and lumbar spine were obtained without intravenous contrast. COMPARISON:  CT chest 02/24/2018.  CT lumbar 02/24/2018 FINDINGS: MRI THORACIC SPINE FINDINGS Alignment:  Moderate dextroscoliosis.  Normal sagittal alignment. Vertebrae: Multiple fractures appear benign. Mild chronic fracture T7 unchanged Moderate fracture of T8 with bone marrow edema. This appears to have progressed in the interval Mild compression fracture T10 appears chronic Extensive edema in the inferior endplate of U54 with associative fracture which has developed since the prior study. Superior endplate fracture Y70 is unchanged and chronic. T12 compression fracture is chronic and unchanged Cord:  Negative for cord compression.  Cord signal is normal. Paraspinal and other soft tissues: Negative Disc levels: Negative for spinal stenosis. Negative for disc protrusion. Mild disc degeneration is present at T8-9 MRI LUMBAR SPINE FINDINGS Segmentation:  Normal Alignment:  Normal Vertebrae: Mild  compression fracture of L1 with bone marrow edema has developed since the prior CT and therefore appears to be acute or subacute. Chronic fracture L1 is moderately severe and unchanged. Conus medullaris and cauda equina: Conus extends to the L1-2 level. Conus and cauda equina appear normal. Paraspinal and other soft tissues: Negative for paraspinous mass. Left renal cyst. Markedly distended urinary bladder. Disc levels: T12-L1: Negative for stenosis L1-2: Mild bony retropulsion of L2 into the canal without significant stenosis L2-3: Mild disc bulging without stenosis. L3-4: Mild disc degeneration. Small annular tear on the left without significant disc protrusion or stenosis L4-5: Mild disc degeneration without stenosis. L5-S1: Negative IMPRESSION: MR THORACIC SPINE IMPRESSION Moderate compression fracture T8 with bone marrow edema. This appears to have progressed in the interval since the prior CT of 02/24/2018. Interval development of inferior endplate fracture W23 with associated edema suggesting acute or subacute fracture. Chronic compression fractures of T7, T10, T12 MR LUMBAR SPINE IMPRESSION Mild compression fracture of L1 with bone marrow edema which has developed since the prior CT. Chronic fracture L1 unchanged Markedly distended urinary bladder.  Recommend Foley catheter Electronically Signed   By: Franchot Gallo M.D.   On: 04/18/2018 08:40    Pending Labs Unresulted Labs (From admission, onward)   None      Vitals/Pain Today's Vitals   04/18/18 0748 04/18/18 0843 04/18/18 0937 04/18/18 1044  BP:  (!) 142/82  (!) 151/83  Pulse:  97  (!) 115  Resp:  16  15  Temp:      TempSrc:      SpO2:  97%  97%  Weight:      Height:      PainSc: 9   6      Isolation Precautions No active isolations  Medications Medications  ketorolac (TORADOL) 15 MG/ML injection 15 mg (15 mg Intravenous Given 04/18/18 0449)  diazepam (VALIUM) injection 1 mg (1 mg Intravenous Given 04/18/18 0450)  sodium  chloride 0.9 % bolus 1,000 mL (0 mLs Intravenous Stopped 04/18/18 0556)  diazepam (VALIUM) injection 1 mg (1 mg Intravenous Given 04/18/18 0605)  HYDROmorphone (DILAUDID) injection 1 mg (1 mg Intravenous Given 04/18/18 0604)  HYDROmorphone (DILAUDID) injection 0.5 mg (0.5 mg Intravenous Given 04/18/18 0846)  0.9 %  sodium chloride infusion ( Intravenous New Bag/Given 04/18/18 0940)    Mobility non-ambulatory

## 2018-04-18 NOTE — ED Notes (Signed)
Called [employer] for brace placement

## 2018-04-19 DIAGNOSIS — M545 Low back pain: Secondary | ICD-10-CM | POA: Diagnosis not present

## 2018-04-19 MED ORDER — METHOCARBAMOL 500 MG PO TABS
500.0000 mg | ORAL_TABLET | Freq: Three times a day (TID) | ORAL | Status: DC
  Administered 2018-04-19 – 2018-04-22 (×10): 500 mg via ORAL
  Filled 2018-04-19 (×10): qty 1

## 2018-04-19 MED ORDER — OXYCODONE-ACETAMINOPHEN 5-325 MG PO TABS
1.0000 | ORAL_TABLET | ORAL | Status: DC | PRN
  Administered 2018-04-19 – 2018-04-22 (×13): 1 via ORAL
  Filled 2018-04-19 (×12): qty 1

## 2018-04-19 MED ORDER — OXYCODONE-ACETAMINOPHEN 5-325 MG PO TABS
1.0000 | ORAL_TABLET | ORAL | Status: DC | PRN
  Filled 2018-04-19: qty 1

## 2018-04-19 MED ORDER — HYDROCODONE-ACETAMINOPHEN 5-325 MG PO TABS: 1.0000 | ORAL_TABLET | ORAL | Status: DC | PRN

## 2018-04-19 NOTE — Progress Notes (Signed)
[facility] Progress Note  Patient: Sarah Miranda JXB:[id number]   PCP: Rodrigo RanPerini, Mark, MD DOB: 15-Mar-1946   DOA: 04/18/2018   DOS: 04/19/2018   Date of Service: the patient was seen and examined on 04/19/2018  Subjective: still has a lot of pain and constipation. Pain meds work but doesn't last long enough.   Brief hospital course: Pt. with PMH of fibromyalgia, recurrent compression fractures, bipolar disorder; admitted on 04/18/2018, presented with complaint of fall, was found to have new compression fractures in T11 and L1. Currently further plan is continue current pain regimen with changes.  Assessment and Plan: Acute on chronic vertebral fractures- multiple fractures.   from osteoporosis, doubt pathologic fractures at this time.   MRI thoracic and lumbar spine - moderate compression fracture T8- progressed, Interval development of inferior endplate fracture Z3011- acute or subacute fracture. Chronic compression fractures of T7, T10, T12, Mild compression fracture of L1- which has developed, Chronic fracture L1 unchanged.  Patient able to void on her own.   No radiculopathy or lower extremity weakness.   Reports she has kept up with her routine screenings. - Neurosurgeon, Dr. Dutch QuintPoole, consulted in ED- Aspen LSO w/ thoracic extender, f/u in 2 weeks. - IV Dilaudid 0.5 every 3 hours as needed, will continue that due to severe pain with movement, resume home percocet.  - on scheduled robaxin for now  - PT evaluation, SNF recommended  - Patient needs to follow-up with DEXA scan's, and likely start bisphosphonates or require more aggressive therapy - Would avoid NSAIDs at this time, recent admission 6/21- 6/22 for hematochezia - Continue Lyrica and amitriptyline - her fibromyalgia is complicating the pain control.   Hypothyroidism-last TSH 02/24/2018 - 0.86. - continue Synthroid  BPD-  -Continue Lyrica amitriptyline risperidone Prozac  Fibromyalgia -Continue Lyrica and  amitriptyline  HTN-blood pressure 130s to 160s.  Not on antihypertensives. -Monitor - mildly tachycardic due to uncontrolled pain  Recent hematochezia-admission 6/21-6/22-EGD results- LA Grade B reflux esophagitis, Barrett's esophagus (biopsy consistent). Colonoscopy revealed-Congested, erythematous and granular mucosa in the descending colon ( biopsy- benign). Recommended discontinue NSAIDs, cont Protonix.  Diet: regular diet DVT Prophylaxis: subcutaneous Heparin  Advance goals of care discussion: full code  Family Communication: family was present at bedside, at the time of interview. The pt provided permission to discuss medical plan with the family. Opportunity was given to ask question and all questions were answered satisfactorily.   Disposition:  Discharge to SNF.  Consultants: neurosurgery Procedures: none  Antibiotics: Anti-infectives (From admission, onward)   None       Objective: Physical Exam: Vitals:   04/18/18 1451 04/18/18 1953 04/19/18 0552 04/19/18 1426  BP: (!) 159/102 100/67 132/81 (!) 141/109  Pulse: (!) 118 (!) 103 (!) 106 (!) 112  Resp: 14 20 18 20   Temp: 97.7 F (36.5 C) (!) 97.4 F (36.3 C) 97.8 F (36.6 C) 99 F (37.2 C)  TempSrc:   Oral Oral  SpO2: 98% 95% 100% 100%  Weight:      Height:        Intake/Output Summary (Last 24 hours) at 04/19/2018 1742 Last data filed at 04/18/2018 1844 Gross per 24 hour  Intake -  Output 600 ml  Net -600 ml   Filed Weights   04/18/18 0357  Weight: 70.8 kg   General: Alert, Awake and Oriented to Time, Place and Person. Appear in moderate distress, affect appropriate Eyes: PERRL, Conjunctiva normal ENT: Oral Mucosa clear moist. Neck: no JVD, no Abnormal Mass Or lumps  Cardiovascular: S1 and S2 Present, no Murmur, Peripheral Pulses Present Respiratory: normal respiratory effort, Bilateral Air entry equal and Decreased, no use of accessory muscle, Clear to Auscultation, no Crackles, no  wheezes Abdomen: Bowel Sound present, Soft and no tenderness, no hernia Skin: no redness, no Rash, no induration Extremities: no Pedal edema, no calf tenderness Neurologic: Grossly no focal neuro deficit. Bilaterally Equal motor strength  Data Reviewed: CBC: Recent Labs  Lab 04/18/18 0712  WBC 6.3  NEUTROABS 3.8  HGB 10.7*  HCT 34.9*  MCV 81.5  PLT 294   Basic Metabolic Panel: Recent Labs  Lab 04/18/18 0712  NA 140  K 3.8  CL 103  CO2 27  GLUCOSE 99  BUN 10  CREATININE 0.68  CALCIUM 8.5*    Liver Function Tests: No results for input(s): AST, ALT, ALKPHOS, BILITOT, PROT, ALBUMIN in the last 168 hours. No results for input(s): LIPASE, AMYLASE in the last 168 hours. No results for input(s): AMMONIA in the last 168 hours. Coagulation Profile: No results for input(s): INR, PROTIME in the last 168 hours. Cardiac Enzymes: No results for input(s): CKTOTAL, CKMB, CKMBINDEX, TROPONINI in the last 168 hours. BNP (last 3 results) No results for input(s): PROBNP in the last 8760 hours. CBG: No results for input(s): GLUCAP in the last 168 hours. Studies: No results found.  Scheduled Meds: . amitriptyline  25 mg Oral QHS  . enoxaparin (LOVENOX) injection  40 mg Subcutaneous Q24H  . FLUoxetine  40 mg Oral Daily  . levothyroxine  25 mcg Oral QAC breakfast  . methocarbamol  500 mg Oral TID  . pantoprazole  40 mg Oral Daily  . pregabalin  150 mg Oral Daily  . risperiDONE  1 mg Oral BID   Continuous Infusions: PRN Meds: acetaminophen **OR** acetaminophen, HYDROmorphone (DILAUDID) injection, ondansetron **OR** ondansetron (ZOFRAN) IV, oxyCODONE-acetaminophen, senna  Time spent: 35 minutes  Author: Lynden OxfordPranav Patel, MD [facility] Pager: [phone] 04/19/2018 5:42 PM  If 7PM-7AM, please contact night-coverage at [email or web address], password [email or web address]

## 2018-04-19 NOTE — Progress Notes (Signed)
OT Cancellation Note  Patient Details Name: Cleaster CorinBrenda P Blume MRN: [id number] DOB: 09/04/1946   Cancelled Treatment:    Reason Eval/Treat Not Completed: Pain limiting ability to participate.  RN is working on pain control per PT. Will check back later today.  Natacia Chaisson 04/19/2018, 8:56 AM  Marica OtterMaryellen Johnetta Sloniker, OTR/L [phone] 04/19/2018

## 2018-04-19 NOTE — Evaluation (Signed)
Occupational Therapy Evaluation Patient Details Name: Sarah Miranda MRN: [id number] DOB: March 15, 1946 Today's Date: 04/19/2018    History of Present Illness Pt was admitted with worsening back pain. H/o chronic vertebral fxs:  some compression fxs new; fibromyalgia, bipolar.   Clinical Impression   This 72 year old female was admitted for the above.  She will benefit from continued OT to increase safety and independence with adls, following back precautions. Pt was mostly independent with basic adls prior to admission.  Goals are for min guard to min A.  Of note, husband has back issues also. He came in at the end of the session.  Pt's legs were partially buckling:  +2 needed for safety. Recommend SNF for rehab.      Follow Up Recommendations  SNF(if home, 24/7 and HHOT)    Equipment Recommendations  3 in 1 bedside commode(will likely need tub bench--not covered)    Recommendations for Other Services       Precautions / Restrictions Precautions Precautions: Back Precaution Comments: clarifying if brace can be removed:  pt states it was donned in bed Required Braces or Orthoses: Spinal Brace Spinal Brace: Thoracolumbosacral orthotic Restrictions Weight Bearing Restrictions: No      Mobility Bed Mobility Overal bed mobility: Needs Assistance Bed Mobility: Rolling;Sidelying to Sit Rolling: Min assist Sidelying to sit: Min assist       General bed mobility comments: used bedrail  Transfers Overall transfer level: Needs assistance Equipment used: Rolling walker (2 wheeled) Transfers: Sit to/from Stand Sit to Stand: Min assist;+2 physical assistance;From elevated surface         General transfer comment: assist to rise and steady. Cues for UE placement    Balance                                           ADL either performed or assessed with clinical judgement   ADL Overall ADL's : Needs assistance/impaired Eating/Feeding: Independent    Grooming: Set up   Upper Body Bathing: Set up   Lower Body Bathing: Maximal assistance;Sit to/from stand   Upper Body Dressing : Minimal assistance   Lower Body Dressing: Total assistance   Toilet Transfer: Minimal assistance;+2 for safety/equipment;Ambulation   Toileting- Clothing Manipulation and Hygiene: Maximal assistance;Sit to/from stand         General ADL Comments: Pt wearing TLSO and states it has been on her since they placed it on.  She had multiples sheets and a cap under her in bed.  Ambulated a little with +2 safety as leg gives and sat up in chair at end of session for lunch     Vision         Perception     Praxis      Pertinent Vitals/Pain Pain Assessment: Faces Faces Pain Scale: Hurts even more Pain Location: back Pain Descriptors / Indicators: Aching Pain Intervention(s): Limited activity within patient's tolerance;Premedicated before session;Monitored during session;Repositioned     Hand Dominance     Extremity/Trunk Assessment Upper Extremity Assessment Upper Extremity Assessment: Generalized weakness           Communication Communication Communication: No difficulties   Cognition Arousal/Alertness: Awake/alert Behavior During Therapy: WFL for tasks assessed/performed Overall Cognitive Status: Impaired/Different from baseline  General Comments: did not answer all PLOF questions correctly (husband came in) and also did not give correct info about bed height   General Comments       Exercises     Shoulder Instructions      Home Living Family/patient expects to be discharged to:: Unsure Living Arrangements: Spouse/significant other Available Help at Discharge: Family Type of Home: House Home Access: Level entry     Home Layout: One level     Bathroom Shower/Tub: Chief Strategy OfficerTub/shower unit   Bathroom Toilet: Standard     Home Equipment: Grab bars - tub/shower          Prior  Functioning/Environment Level of Independence: Needs assistance        Comments: husband did housework.  A couple of times, husband helped with adls, but not as a general room        OT Problem List: Decreased strength;Decreased activity tolerance;Impaired balance (sitting and/or standing);Decreased cognition;Pain;Decreased knowledge of precautions;Decreased knowledge of use of DME or AE      OT Treatment/Interventions: Self-care/ADL training;Energy conservation;DME and/or AE instruction;Therapeutic activities;Cognitive remediation/compensation;Patient/family education;Balance training    OT Goals(Current goals can be found in the care plan section) Acute Rehab OT Goals Patient Stated Goal: less pain OT Goal Formulation: With patient Time For Goal Achievement: 05/03/18 Potential to Achieve Goals: Good ADL Goals Pt Will Transfer to Toilet: with min guard assist;ambulating;bedside commode Pt Will Perform Tub/Shower Transfer: Tub transfer;with min assist;tub bench;ambulating Additional ADL Goal #1: pt will perform bed mobility from flat bed with min guard in preparation for adls Additional ADL Goal #2: pt will complete LB adls with min A using ae  OT Frequency: Min 2X/week   Barriers to D/C:            Co-evaluation PT/OT/SLP Co-Evaluation/Treatment: Yes Reason for Co-Treatment: For patient/therapist safety PT goals addressed during session: Mobility/safety with mobility OT goals addressed during session: ADL's and self-care      AM-PAC PT "6 Clicks" Daily Activity     Outcome Measure Help from another person eating meals?: A Little Help from another person taking care of personal grooming?: A Lot Help from another person toileting, which includes using toliet, bedpan, or urinal?: A Little Help from another person bathing (including washing, rinsing, drying)?: A Lot Help from another person to put on and taking off regular upper body clothing?: A Lot Help from another person  to put on and taking off regular lower body clothing?: Total 6 Click Score: 13   End of Session Nurse Communication: Mobility status  Activity Tolerance: Patient tolerated treatment well Patient left: in chair;with call bell/phone within reach;with family/visitor present  OT Visit Diagnosis: Muscle weakness (generalized) (M62.81);History of falling (Z91.81);Unsteadiness on feet (R26.81)                Time: [id number] OT Time Calculation (min): 32 min Charges:  OT General Charges $OT Visit: 1 Visit OT Evaluation $OT Eval Moderate Complexity: 1 [address]Mod  Katianna Mcclenney, OTR/L [phone] 04/19/2018  Rendy Lazard 04/19/2018, 12:53 PM

## 2018-04-19 NOTE — Care Management Note (Signed)
Case Management Note  Patient Details  Name: Sarah Miranda MRN: [id number] Date of Birth: 13-Apr-1946  Subjective/Objective:                  Back brace in place/pt still taking iv dilaudid for pain  Action/Plan: Will follow for dc needs  Expected Discharge Date:  (unknown)               Expected Discharge Plan:  Home/Self Care  In-House Referral:     Discharge planning Services  CM Consult  Post Acute Care Choice:    Choice offered to:     DME Arranged:    DME Agency:     HH Arranged:    HH Agency:     Status of Service:  In process, will continue to follow  If discussed at Long Length of Stay Meetings, dates discussed:    Additional Comments:  Golda AcreDavis, Rhonda Lynn, RN 04/19/2018, 11:10 AM

## 2018-04-19 NOTE — Evaluation (Signed)
Physical Therapy Evaluation Patient Details Name: Sarah Miranda MRN: [id number] DOB: [birth year] Today's Date: 04/19/2018   History of Present Illness  Pt was admitted with worsening back pain. H/o chronic vertebral fxs:  some compression fxs new; fibromyalgia, bipolar.  Clinical Impression  Pt admitted as above and presenting with functional mobility limitations 2* back pain, generalized weakness and balance issues.  Pt would benefit from follow up rehab at SNF level to maximize IND and safety prior to return home with spouse who can offer limited physical assistance.     Follow Up Recommendations SNF    Equipment Recommendations  Rolling walker with 5" wheels;3in1 (PT)    Recommendations for Other Services OT consult     Precautions / Restrictions Precautions Precautions: Back Precaution Comments: clarifying if brace can be removed:  pt states it was donned in bed Required Braces or Orthoses: Spinal Brace Spinal Brace: Thoracolumbosacral orthotic Restrictions Weight Bearing Restrictions: No      Mobility  Bed Mobility Overal bed mobility: Needs Assistance Bed Mobility: Rolling;Sidelying to Sit Rolling: Min assist Sidelying to sit: Min assist       General bed mobility comments: used bedrail  Transfers Overall transfer level: Needs assistance Equipment used: Rolling walker (2 wheeled) Transfers: Sit to/from Stand Sit to Stand: Min assist;+2 physical assistance;From elevated surface         General transfer comment: assist to rise and steady. Cues for UE placement  Ambulation/Gait Ambulation/Gait assistance: Min assist;Mod assist;+2 safety/equipment Gait Distance (Feet): 50 Feet Assistive device: Rolling walker (2 wheeled) Gait Pattern/deviations: Step-to pattern;Step-through pattern;Decreased step length - right;Decreased step length - left;Shuffle Gait velocity: decr   General Gait Details: cues for position from RW.  Slow deliberate pace 2* pain and  intermittent buckling at knees  Stairs            Wheelchair Mobility    Modified Rankin (Stroke Patients Only)       Balance Overall balance assessment: Needs assistance Sitting-balance support: Bilateral upper extremity supported;Feet supported Sitting balance-Leahy Scale: Fair     Standing balance support: Bilateral upper extremity supported Standing balance-Leahy Scale: Poor                               Pertinent Vitals/Pain Pain Assessment: Faces Faces Pain Scale: Hurts even more Pain Location: back Pain Descriptors / Indicators: Aching Pain Intervention(s): Limited activity within patient's tolerance;Monitored during session;Premedicated before session    Home Living Family/patient expects to be discharged to:: Unsure Living Arrangements: Spouse/significant other Available Help at Discharge: Family Type of Home: House Home Access: Level entry     Home Layout: One level Home Equipment: Grab bars - tub/shower      Prior Function Level of Independence: Needs assistance         Comments: husband did housework.  A couple of times, husband helped with adls, but not as a general room     Hand Dominance        Extremity/Trunk Assessment   Upper Extremity Assessment Upper Extremity Assessment: Generalized weakness    Lower Extremity Assessment Lower Extremity Assessment: Generalized weakness    Cervical / Trunk Assessment Cervical / Trunk Assessment: Kyphotic  Communication   Communication: No difficulties  Cognition Arousal/Alertness: Awake/alert Behavior During Therapy: WFL for tasks assessed/performed Overall Cognitive Status: Impaired/Different from baseline  General Comments: did not answer all PLOF questions correctly (husband came in) and also did not give correct info about bed height      General Comments      Exercises     Assessment/Plan    PT Assessment Patient  needs continued PT services  PT Problem List         PT Treatment Interventions DME instruction;Gait training;Functional mobility training;Therapeutic activities;Patient/family education    PT Goals (Current goals can be found in the Care Plan section)  Acute Rehab PT Goals Patient Stated Goal: less pain PT Goal Formulation: With patient Time For Goal Achievement: 05/03/18 Potential to Achieve Goals: Fair    Frequency Min 3X/week   Barriers to discharge Decreased caregiver support Husband available 24/7 but with personal health limitations    Co-evaluation PT/OT/SLP Co-Evaluation/Treatment: Yes Reason for Co-Treatment: For patient/therapist safety PT goals addressed during session: Mobility/safety with mobility OT goals addressed during session: ADL's and self-care       AM-PAC PT "6 Clicks" Daily Activity  Outcome Measure Difficulty turning over in bed (including adjusting bedclothes, sheets and blankets)?: Unable Difficulty moving from lying on back to sitting on the side of the bed? : Unable Difficulty sitting down on and standing up from a chair with arms (e.g., wheelchair, bedside commode, etc,.)?: Unable Help needed moving to and from a bed to chair (including a wheelchair)?: A Lot Help needed walking in hospital room?: A Lot Help needed climbing 3-5 steps with a railing? : A Lot 6 Click Score: 9    End of Session Equipment Utilized During Treatment: Gait belt;Back brace Activity Tolerance: Patient limited by fatigue;Patient limited by pain Patient left: in chair;with call bell/phone within reach;with family/visitor present Nurse Communication: Mobility status PT Visit Diagnosis: Difficulty in walking, not elsewhere classified (R26.2);Muscle weakness (generalized) (M62.81);History of falling (Z91.81);Pain    Time: 1610-96041157-1232 PT Time Calculation (min) (ACUTE ONLY): 35 min   Charges:   PT Evaluation $PT Eval Moderate Complexity: 1 Mod          Pg [phone]  [phone]   Harith Mccadden 04/19/2018, 1:44 PM

## 2018-04-19 NOTE — Care Management Obs Status (Signed)
MEDICARE OBSERVATION STATUS NOTIFICATION   Patient Details  Name: Sarah Miranda MRN: [id number] Date of Birth: [birth year]   Medicare Observation Status Notification Given:  Yes    Golda AcreDavis, Rhonda Lynn, RN 04/19/2018, 11:58 AM

## 2018-04-20 DIAGNOSIS — K219 Gastro-esophageal reflux disease without esophagitis: Secondary | ICD-10-CM | POA: Diagnosis present

## 2018-04-20 DIAGNOSIS — Z79890 Hormone replacement therapy (postmenopausal): Secondary | ICD-10-CM | POA: Diagnosis not present

## 2018-04-20 DIAGNOSIS — G8929 Other chronic pain: Secondary | ICD-10-CM

## 2018-04-20 DIAGNOSIS — R339 Retention of urine, unspecified: Secondary | ICD-10-CM | POA: Diagnosis present

## 2018-04-20 DIAGNOSIS — E039 Hypothyroidism, unspecified: Secondary | ICD-10-CM | POA: Diagnosis present

## 2018-04-20 DIAGNOSIS — M545 Low back pain: Secondary | ICD-10-CM | POA: Diagnosis not present

## 2018-04-20 DIAGNOSIS — K5900 Constipation, unspecified: Secondary | ICD-10-CM | POA: Diagnosis present

## 2018-04-20 DIAGNOSIS — M8088XA Other osteoporosis with current pathological fracture, vertebra(e), initial encounter for fracture: Secondary | ICD-10-CM | POA: Diagnosis present

## 2018-04-20 DIAGNOSIS — E871 Hypo-osmolality and hyponatremia: Secondary | ICD-10-CM | POA: Diagnosis not present

## 2018-04-20 DIAGNOSIS — F319 Bipolar disorder, unspecified: Secondary | ICD-10-CM | POA: Diagnosis present

## 2018-04-20 DIAGNOSIS — M797 Fibromyalgia: Secondary | ICD-10-CM | POA: Diagnosis present

## 2018-04-20 DIAGNOSIS — I10 Essential (primary) hypertension: Secondary | ICD-10-CM | POA: Diagnosis present

## 2018-04-20 MED ORDER — DOCUSATE SODIUM 100 MG PO CAPS
100.0000 mg | ORAL_CAPSULE | Freq: Two times a day (BID) | ORAL | Status: DC
  Administered 2018-04-20 – 2018-04-22 (×5): 100 mg via ORAL
  Filled 2018-04-20 (×5): qty 1

## 2018-04-20 MED ORDER — FLUCONAZOLE IN SODIUM CHLORIDE 200-0.9 MG/100ML-% IV SOLN
200.0000 mg | Freq: Once | INTRAVENOUS | Status: AC
  Administered 2018-04-20: 200 mg via INTRAVENOUS
  Filled 2018-04-20: qty 100

## 2018-04-20 MED ORDER — TAMSULOSIN HCL 0.4 MG PO CAPS
0.4000 mg | ORAL_CAPSULE | Freq: Every day | ORAL | Status: DC
  Administered 2018-04-20 – 2018-04-22 (×3): 0.4 mg via ORAL
  Filled 2018-04-20 (×3): qty 1

## 2018-04-20 NOTE — NC FL2 (Addendum)
[place] MEDICAID FL2 LEVEL OF CARE SCREENING TOOL     IDENTIFICATION  Patient Name: Sarah CorinBrenda P Goerke Birthdate: 04-16-1946 Sex: female Admission Date (Current Location): 04/18/2018  [facility] [place]   Facility and Address:  [facility],  [address], [place] [id number]      Provider Number: [id number]  Attending Physician Name and Address:  Rolly SalterPatel, Pranav M, MD  Relative Name and Phone Number:       Current Level of Care: Hospital Recommended Level of Care: Skilled Nursing Facility Prior Approval Number:    Date Approved/Denied:   PASRR Number:   [id number]   Discharge Plan: SNF    Current Diagnoses: Patient Active Problem List   Diagnosis Date Noted  . Acute back pain 04/18/2018  . Iron deficiency anemia   . Gastroesophageal reflux disease with esophagitis   . Hematochezia 02/24/2018  . Compression fracture of T7 vertebra (HCC) 02/24/2018  . Tachycardia 02/24/2018  . Uncomplicated opioid dependence (HCC) 09/25/2017  . Symptomatic anemia 05/20/2017  . Nausea without vomiting 06/23/2016  . Corn 06/23/2016  . Callus 06/02/2014  . Hyponatremia 01/22/2014  . Depression   . Insomnia   . Fibromyalgia   . Anxiety   . Hypothyroidism   . Hyperglycemia   . Malaise   . Bipolar disorder (HCC)   . Edema   . Essential hypertension     Orientation RESPIRATION BLADDER Height & Weight     Self, Time, Situation  Normal Continent Weight: 156 lb (70.8 kg) Height:  5\' 4"  (162.6 cm)  BEHAVIORAL SYMPTOMS/MOOD NEUROLOGICAL BOWEL NUTRITION STATUS      Continent Diet(See dc summary)  AMBULATORY STATUS COMMUNICATION OF NEEDS Skin   Extensive Assist Verbally Normal                       Personal Care Assistance Level of Assistance  Bathing, Feeding, Dressing Bathing Assistance: Independent Feeding assistance: Limited assistance Dressing Assistance: Independent     Functional Limitations Info  Sight, Hearing, Speech Sight  Info: Adequate Hearing Info: Adequate Speech Info: Adequate    SPECIAL CARE FACTORS FREQUENCY  PT (By licensed PT), OT (By licensed OT)     PT Frequency: 5x/week OT Frequency: 5x/week            Contractures Contractures Info: Not present    Additional Factors Info  Code Status, Allergies Code Status Info: Full Allergies Info: Fetzima Levomilnacipran           Current Medications (04/20/2018):  This is the current hospital active medication list Current Facility-Administered Medications  Medication Dose Route Frequency Provider Last Rate Last Dose  . acetaminophen (TYLENOL) tablet 650 mg  650 mg Oral Q6H PRN Emokpae, Ejiroghene E, MD       Or  . acetaminophen (TYLENOL) suppository 650 mg  650 mg Rectal Q6H PRN Emokpae, Ejiroghene E, MD      . amitriptyline (ELAVIL) tablet 25 mg  25 mg Oral QHS Emokpae, Ejiroghene E, MD   25 mg at 04/19/18 2144  . docusate sodium (COLACE) capsule 100 mg  100 mg Oral BID Rolly SalterPatel, Pranav M, MD   100 mg at 04/20/18 1004  . enoxaparin (LOVENOX) injection 40 mg  40 mg Subcutaneous Q24H Emokpae, Ejiroghene E, MD   40 mg at 04/19/18 1730  . fluconazole (DIFLUCAN) IVPB 200 mg  200 mg Intravenous Once Rolly SalterPatel, Pranav M, MD 100 mL/hr at 04/20/18 1247 200 mg at 04/20/18 1247  . FLUoxetine (  PROZAC) capsule 40 mg  40 mg Oral Daily Emokpae, Ejiroghene E, MD   40 mg at 04/20/18 1004  . HYDROmorphone (DILAUDID) injection 0.5 mg  0.5 mg Intravenous Q3H PRN Emokpae, Ejiroghene E, MD   0.5 mg at 04/20/18 0924  . levothyroxine (SYNTHROID, LEVOTHROID) tablet 25 mcg  25 mcg Oral QAC breakfast Emokpae, Ejiroghene E, MD   25 mcg at 04/20/18 0755  . methocarbamol (ROBAXIN) tablet 500 mg  500 mg Oral TID Rolly SalterPatel, Pranav M, MD   500 mg at 04/20/18 1004  . ondansetron (ZOFRAN) tablet 4 mg  4 mg Oral Q6H PRN Emokpae, Ejiroghene E, MD       Or  . ondansetron (ZOFRAN) injection 4 mg  4 mg Intravenous Q6H PRN Emokpae, Ejiroghene E, MD      . oxyCODONE-acetaminophen  (PERCOCET/ROXICET) 5-325 MG per tablet 1 tablet  1 tablet Oral Q4H PRN Rolly SalterPatel, Pranav M, MD   1 tablet at 04/20/18 1258  . pantoprazole (PROTONIX) EC tablet 40 mg  40 mg Oral Daily Emokpae, Ejiroghene E, MD   40 mg at 04/20/18 1004  . pregabalin (LYRICA) capsule 150 mg  150 mg Oral Daily Emokpae, Ejiroghene E, MD   150 mg at 04/20/18 1004  . risperiDONE (RISPERDAL) tablet 1 mg  1 mg Oral BID Emokpae, Ejiroghene E, MD   1 mg at 04/20/18 1004  . senna (SENOKOT) tablet 8.6 mg  1 tablet Oral Daily PRN Emokpae, Ejiroghene E, MD      . tamsulosin (FLOMAX) capsule 0.4 mg  0.4 mg Oral Daily Rolly SalterPatel, Pranav M, MD   0.4 mg at 04/20/18 1004     Discharge Medications: Please see discharge summary for a list of discharge medications.  Relevant Imaging Results:  Relevant Lab Results:   Additional Information SSN: [id number]  Coralyn HellingBernette Jones, LCSW

## 2018-04-20 NOTE — Progress Notes (Signed)
Occupational Therapy Treatment Patient Details Name: Sarah CorinBrenda P Yearick MRN: [id number] DOB: [birth year] Today's Date: 04/20/2018    History of present illness Pt was admitted with worsening back pain. H/o chronic vertebral fxs: some compression fxs new; fibromyalgia, bipolar.   OT comments  Assisted back to bed, removed brace and checked skin, and educated on tub transfer bench (DME)  Follow Up Recommendations  SNF    Equipment Recommendations  3 in 1 bedside commode    Recommendations for Other Services      Precautions / Restrictions Precautions Precautions: Back Precaution Comments: brace when ambulating:  clarified by Dr Allena KatzPatel 04/19/18 Required Braces or Orthoses: Spinal Brace Spinal Brace: Thoracolumbosacral orthotic Restrictions Weight Bearing Restrictions: No       Mobility Bed Mobility Overal bed mobility: Needs Assistance Bed Mobility: Rolling;Sidelying to Sit Rolling: Min assist Sidelying to sit: Min assist     Sit to sidelying: Mod assist;+2 for physical assistance;+2 for safety/equipment General bed mobility comments: assist for trunk and legs:  assist to ensure back precautions  Transfers Overall transfer level: Needs assistance Equipment used: Rolling walker (2 wheeled) Transfers: Sit to/from Stand Sit to Stand: Min assist;+2 physical assistance;+2 safety/equipment         General transfer comment: +2 due to legs buckling    Balance Overall balance assessment: Needs assistance Sitting-balance support: Feet supported Sitting balance-Leahy Scale: Fair     Standing balance support: Bilateral upper extremity supported Standing balance-Leahy Scale: Poor                             ADL either performed or assessed with clinical judgement   ADL                           Toilet Transfer: Minimal assistance;+2 for physical assistance;+2 for safety/equipment;Stand-pivot;RW(to bed)             General ADL Comments: RN and  OT assisted pt back to bed. removed brace at EOB and assisted to sidelying then log roll.  Skin OK slightly red under arms where straps are positioned.  Reviewed brace with RN.  Showed pt photo of tub bench for when she is home eventually     Vision       Perception     Praxis      Cognition Arousal/Alertness: Awake/alert Behavior During Therapy: WFL for tasks assessed/performed Overall Cognitive Status: Impaired/Different from baseline                                 General Comments: followed all commands         Exercises     Shoulder Instructions       General Comments      Pertinent Vitals/ Pain       Pain Assessment: 0-10 Pain Score: 6  Pain Location: back Pain Descriptors / Indicators: Aching Pain Intervention(s): Limited activity within patient's tolerance;Monitored during session;Repositioned;Patient requesting pain meds-RN notified  Home Living                                          Prior Functioning/Environment              Frequency  Min 2X/week  Progress Toward Goals  OT Goals(current goals can now be found in the care plan section)  Progress towards OT goals: Progressing toward goals     Plan      Co-evaluation                 AM-PAC PT "6 Clicks" Daily Activity     Outcome Measure   Help from another person eating meals?: A Little Help from another person taking care of personal grooming?: A Lot Help from another person toileting, which includes using toliet, bedpan, or urinal?: A Little Help from another person bathing (including washing, rinsing, drying)?: A Lot Help from another person to put on and taking off regular upper body clothing?: A Lot Help from another person to put on and taking off regular lower body clothing?: Total 6 Click Score: 13    End of Session    OT Visit Diagnosis: Muscle weakness (generalized) (M62.81);History of falling (Z91.81);Unsteadiness on feet  (R26.81)   Activity Tolerance Patient limited by fatigue   Patient Left in bed;with call bell/phone within reach;with bed alarm set;with nursing/sitter in room   Nurse Communication          Time: 1610-96041512-1528 OT Time Calculation (min): 16 min  Charges: OT General Charges $OT Visit: 1 Visit OT Treatments $Therapeutic Activity: 8-22 mins  Marica OtterMaryellen Simmie Camerer, OTR/L [phone] 04/20/2018   Aleah Ahlgrim 04/20/2018, 3:44 PM

## 2018-04-20 NOTE — Plan of Care (Signed)
Plan of care discussed.   

## 2018-04-20 NOTE — Clinical Social Work Note (Signed)
Clinical Social Work Assessment  Patient Details  Name: Sarah Miranda MRN: [id number] Date of Birth: 1946/03/20  Date of referral:  04/20/18               Reason for consult:  Facility Placement                Permission sought to share information with:  Case Manager, Magazine features editoracility Contact Representative, Family Supports Permission granted to share information::  Yes, Verbal Permission Granted  Name::     Sarah Miranda  Agency::  SNF  Relationship::  Spouse  Contact Information:     Housing/Transportation Living arrangements for the past 2 months:  Single Family Home Source of Information:  Spouse Patient Interpreter Needed:  None Criminal Activity/Legal Involvement Pertinent to Current Situation/Hospitalization:  No - Comment as needed Significant Relationships:  Adult Children, Spouse Lives with:  Spouse Do you feel safe going back to the place where you live?  Yes Need for family participation in patient care:  Yes (Comment)  Care giving concerns:  No care giving concerns.    Social Worker assessment / plan:  LCSW consulted for SNF placement.   Patient admitted for acute back pain.   Patient is from home with spouse. According to spouse at baseline patient ambulates without assistance. He reports she was independent in ADLs prior to fall, but was not doing much cooking or cleaning.   Spouse is agreeable to SNF for rehab at dc.   PLAN: SNF at dc.   Employment status:  Retired Database administratornsurance information:  Managed Medicare PT Recommendations:  Skilled Nursing Facility Information / Referral to community resources:  Skilled Nursing Facility  Patient/Family's Response to care:  Spouse is thankful for [employer] coordination.   Patient/Family's Understanding of and Emotional Response to Diagnosis, Current Treatment, and Prognosis:  Spouse asked questions about SNF referral process and facilities. LCSW answered questions and explained referral process. Spouse is hard of hearing and LCSW had to  repeat answers several times.   Emotional Assessment Appearance:  Appears stated age Attitude/Demeanor/Rapport:    Affect (typically observed):  Calm Orientation:  Oriented to Self, Oriented to Place, Oriented to  Time, Oriented to Situation Alcohol / Substance use:  Not Applicable Psych involvement (Current and /or in the community):  No (Comment)  Discharge Needs  Concerns to be addressed:  No discharge needs identified Readmission within the last 30 days:  Yes Current discharge risk:  None Barriers to Discharge:  No SNF bed   Sarah HellingBernette Willye Javier, LCSW 04/20/2018, 1:56 PM

## 2018-04-20 NOTE — Progress Notes (Addendum)
[facility] Progress Note  Patient: Sarah CorinBrenda P Beets ZOX:[id number]   PCP: Rodrigo RanPerini, Mark, MD DOB: 26-Mar-1946   DOA: 04/18/2018   DOS: 04/20/2018   Date of Service: the patient was seen and examined on 04/20/2018  Subjective: Denies to have severe pain with movement.  Requiring IV Dilaudid for any movement.  No nausea or vomiting.  Had bladder retention.  Brief hospital course: Pt. with PMH of fibromyalgia, recurrent compression fractures, bipolar disorder; admitted on 04/18/2018, presented with complaint of fall, was found to have new compression fractures in T11 and L1. Currently further plan is continue current pain regimen with changes.  Assessment and Plan: Acute on chronic vertebral fractures- multiple fractures.   from osteoporosis, doubt pathologic fractures at this time.   MRI thoracic and lumbar spine - moderate compression fracture T8- progressed, Interval development of inferior endplate fracture W1111- acute or subacute fracture. Chronic compression fractures of T7, T10, T12, Mild compression fracture of L1- which has developed, Chronic fracture L1 unchanged.  Patient able to void on her own.   No radiculopathy or lower extremity weakness.   Reports she has kept up with her routine screenings. - Neurosurgeon, Dr. Dutch QuintPoole, consulted in ED- Aspen LSO w/ thoracic extender, f/u in 2 weeks. - IV Dilaudid 0.5 every 3 hours as needed, will continue that due to severe pain with movement, resume home percocet.  - on scheduled robaxin for now  - PT evaluation, SNF recommended  - Patient needs to follow-up with DEXA scan's, and likely start bisphosphonates or require more aggressive therapy - Would avoid NSAIDs at this time, recent admission 6/21- 6/22 for hematochezia - Continue Lyrica and amitriptyline - her fibromyalgia is complicating the pain control. Patient does have poor rectal tone although no other acute findings. Discussed with neurosurgery again myself and recommend outpatient  follow-up as CT and MRIs are negative for any acute injury to spine.  Hypothyroidism-last TSH 02/24/2018 - 0.86. - continue Synthroid  BPD-  -Continue Lyrica amitriptyline risperidone Prozac  Fibromyalgia -Continue Lyrica and amitriptyline  HTN-blood pressure 130s to 160s.  Not on antihypertensives. -Monitor - mildly tachycardic due to uncontrolled pain  Recent hematochezia-admission 6/21-6/22-EGD results- LA Grade B reflux esophagitis, Barrett's esophagus (biopsy consistent). Colonoscopy revealed-Congested, erythematous and granular mucosa in the descending colon ( biopsy- benign). Recommended discontinue NSAIDs, cont Protonix.  Urinary retention.  Failed and out catheterization x4. Urology was consulted while the patient was being seen by urology for Foley catheter placement patient voided 400 cc.  Urology currently recommends to monitor and correct constipation and other etiologies.  Constipation. On stool softeners. Patient is refusing aggressive bowel regimen.  Diet: regular diet DVT Prophylaxis: subcutaneous Heparin  Advance goals of care discussion: full code  Family Communication: family was present at bedside, at the time of interview. The pt provided permission to discuss medical plan with the family. Opportunity was given to ask question and all questions were answered satisfactorily.   Disposition:  Discharge to SNF.  Consultants: neurosurgery Procedures: none  Antibiotics: Anti-infectives (From admission, onward)   Start     Dose/Rate Route Frequency Ordered Stop   04/20/18 1000  fluconazole (DIFLUCAN) IVPB 200 mg     200 mg 100 mL/hr over 60 Minutes Intravenous  Once 04/20/18 0908 04/20/18 1347       Objective: Physical Exam: Vitals:   04/19/18 0552 04/19/18 1426 04/19/18 2025 04/20/18 1400  BP: 132/81 (!) 141/109 (!) 114/95 (!) 135/107  Pulse: (!) 106 (!) 112 95 96  Resp: 18 20 (!)  21 17  Temp: 97.8 F (36.6 C) 99 F (37.2 C) 98.3 F (36.8  C) 97.7 F (36.5 C)  TempSrc: Oral Oral Oral   SpO2: 100% 100% 96% (!) 89%  Weight:      Height:        Intake/Output Summary (Last 24 hours) at 04/20/2018 1750 Last data filed at 04/20/2018 1330 Gross per 24 hour  Intake 340 ml  Output 450 ml  Net -110 ml   Filed Weights   04/18/18 0357  Weight: 70.8 kg   General: Alert, Awake and Oriented to Time, Place and Person. Appear in moderate distress, affect appropriate Eyes: PERRL, Conjunctiva normal ENT: Oral Mucosa clear moist. Neck: no JVD, no Abnormal Mass Or lumps Cardiovascular: S1 and S2 Present, no Murmur, Peripheral Pulses Present Respiratory: normal respiratory effort, Bilateral Air entry equal and Decreased, no use of accessory muscle, Clear to Auscultation, no Crackles, no wheezes Abdomen: Bowel Sound present, Soft and no tenderness, no hernia Skin: no redness, no Rash, no induration Extremities: no Pedal edema, no calf tenderness Neurologic: Grossly no focal neuro deficit. Bilaterally Equal motor strength  Data Reviewed: CBC: Recent Labs  Lab 04/18/18 0712  WBC 6.3  NEUTROABS 3.8  HGB 10.7*  HCT 34.9*  MCV 81.5  PLT 294   Basic Metabolic Panel: Recent Labs  Lab 04/18/18 0712  NA 140  K 3.8  CL 103  CO2 27  GLUCOSE 99  BUN 10  CREATININE 0.68  CALCIUM 8.5*    Liver Function Tests: No results for input(s): AST, ALT, ALKPHOS, BILITOT, PROT, ALBUMIN in the last 168 hours. No results for input(s): LIPASE, AMYLASE in the last 168 hours. No results for input(s): AMMONIA in the last 168 hours. Coagulation Profile: No results for input(s): INR, PROTIME in the last 168 hours. Cardiac Enzymes: No results for input(s): CKTOTAL, CKMB, CKMBINDEX, TROPONINI in the last 168 hours. BNP (last 3 results) No results for input(s): PROBNP in the last 8760 hours. CBG: No results for input(s): GLUCAP in the last 168 hours. Studies: No results found.  Scheduled Meds: . amitriptyline  25 mg Oral QHS  . docusate  sodium  100 mg Oral BID  . enoxaparin (LOVENOX) injection  40 mg Subcutaneous Q24H  . FLUoxetine  40 mg Oral Daily  . levothyroxine  25 mcg Oral QAC breakfast  . methocarbamol  500 mg Oral TID  . pantoprazole  40 mg Oral Daily  . pregabalin  150 mg Oral Daily  . risperiDONE  1 mg Oral BID  . tamsulosin  0.4 mg Oral Daily   Continuous Infusions: PRN Meds: acetaminophen **OR** acetaminophen, HYDROmorphone (DILAUDID) injection, ondansetron **OR** ondansetron (ZOFRAN) IV, oxyCODONE-acetaminophen, senna  Time spent: 35 minutes  Author: Lynden OxfordPranav Jaisean Monteforte, MD [facility] Pager: [phone] 04/20/2018 5:50 PM  If 7PM-7AM, please contact night-coverage at [email or web address], password [email or web address]

## 2018-04-20 NOTE — Progress Notes (Signed)
Physical Therapy Treatment Patient Details Name: Sarah CorinBrenda P Miranda MRN: [id number] DOB: March 09, 1946 Today's Date: 04/20/2018    History of Present Illness Pt was admitted with worsening back pain. H/o chronic vertebral fxs: some compression fxs new; fibromyalgia, bipolar.    PT Comments    Pt assisted with ambulating in hallway and continues to require at least mod assist for mobility.  Pt also with LE buckling during ambulation requiring assist and presents as high fall risk so continue to recommend SNF upon d/c.    Follow Up Recommendations  SNF     Equipment Recommendations  Rolling walker with 5" wheels;3in1 (PT)    Recommendations for Other Services       Precautions / Restrictions Precautions Precautions: Back Precaution Comments: brace when ambulating per newest TLSO order however RN reports brace on at all times? Required Braces or Orthoses: Spinal Brace Spinal Brace: Thoracolumbosacral orthotic    Mobility  Bed Mobility Overal bed mobility: Needs Assistance Bed Mobility: Rolling;Sidelying to Sit Rolling: Min assist Sidelying to sit: Min assist       General bed mobility comments: used bedrail, verbal cues for log roll technique  Transfers Overall transfer level: Needs assistance Equipment used: Rolling walker (2 wheeled) Transfers: Sit to/from Stand Sit to Stand: Min assist;From elevated surface         General transfer comment: assist to rise and steady. Cues for UE placement  Ambulation/Gait Ambulation/Gait assistance: Mod assist Gait Distance (Feet): 80 Feet Assistive device: Rolling walker (2 wheeled) Gait Pattern/deviations: Step-through pattern;Shuffle;Decreased stride length Gait velocity: decr   General Gait Details: verbal cues for use of UEs through RW, pt with occasional LE buckling requiring increased assist, slow pace; distance to tolerance   Stairs             Wheelchair Mobility    Modified Rankin (Stroke Patients Only)        Balance Overall balance assessment: Needs assistance Sitting-balance support: Feet supported Sitting balance-Leahy Scale: Fair     Standing balance support: Bilateral upper extremity supported Standing balance-Leahy Scale: Poor                              Cognition Arousal/Alertness: Awake/alert Behavior During Therapy: WFL for tasks assessed/performed Overall Cognitive Status: Impaired/Different from baseline                                 General Comments: husband not present during session (left room); pt follows commands and pleasant      Exercises      General Comments        Pertinent Vitals/Pain Pain Assessment: 0-10 Pain Score: 10-Worst pain ever Pain Location: back with transfers Pain Descriptors / Indicators: Aching Pain Intervention(s): Limited activity within patient's tolerance;Repositioned;Monitored during session;Premedicated before session(improved with ambulating and when at rest)    Home Living                      Prior Function            PT Goals (current goals can now be found in the care plan section) Progress towards PT goals: Progressing toward goals    Frequency    Min 3X/week      PT Plan Current plan remains appropriate    Co-evaluation              AM-PAC PT "  6 Clicks" Daily Activity  Outcome Measure  Difficulty turning over in bed (including adjusting bedclothes, sheets and blankets)?: Unable Difficulty moving from lying on back to sitting on the side of the bed? : Unable Difficulty sitting down on and standing up from a chair with arms (e.g., wheelchair, bedside commode, etc,.)?: Unable Help needed moving to and from a bed to chair (including a wheelchair)?: A Lot Help needed walking in hospital room?: A Lot Help needed climbing 3-5 steps with a railing? : Total 6 Click Score: 8    End of Session Equipment Utilized During Treatment: Gait belt;Back brace Activity  Tolerance: Patient limited by fatigue;Patient limited by pain Patient left: in chair;with call bell/phone within reach;with nursing/sitter in room;with family/visitor present(spouse in hallway and returning to room) Nurse Communication: Mobility status PT Visit Diagnosis: Difficulty in walking, not elsewhere classified (R26.2);Muscle weakness (generalized) (M62.81);History of falling (Z91.81)     Time: [id number] PT Time Calculation (min) (ACUTE ONLY): 19 min  Charges:  $Gait Training: 8-22 mins                    Zenovia JarredKati Ana Woodroof, PT, DPT 04/20/2018 Pager: [phone]  Maida SaleLEMYRE,KATHrine E 04/20/2018, 2:32 PM

## 2018-04-20 NOTE — Care Management Note (Signed)
Case Management Note  Patient Details  Name: Sarah Miranda MRN: [id number] Date of Birth: 11-04-1945  Subjective/Objective:                  Unable to retrun home due to severe pain and husband's general overall health is poor.  No other family in the area.  Action/Plan: snf Expected Discharge Date:  (unknown)               Expected Discharge Plan:  Home/Self Care  In-House Referral:     Discharge planning Services  CM Consult  Post Acute Care Choice:    Choice offered to:     DME Arranged:    DME Agency:     HH Arranged:    HH Agency:     Status of Service:  In process, will continue to follow  If discussed at Long Length of Stay Meetings, dates discussed:    Additional Comments:  Golda AcreDavis, Rhonda Lynn, RN 04/20/2018, 12:30 PM

## 2018-04-21 LAB — COMPREHENSIVE METABOLIC PANEL
ALT: 9 U/L (ref 0–44)
ANION GAP: 8 (ref 5–15)
AST: 14 U/L — ABNORMAL LOW (ref 15–41)
Albumin: 2.7 g/dL — ABNORMAL LOW (ref 3.5–5.0)
Alkaline Phosphatase: 50 U/L (ref 38–126)
BUN: 21 mg/dL (ref 8–23)
CALCIUM: 8.5 mg/dL — AB (ref 8.9–10.3)
CHLORIDE: 93 mmol/L — AB (ref 98–111)
CO2: 25 mmol/L (ref 22–32)
CREATININE: 0.86 mg/dL (ref 0.44–1.00)
Glucose, Bld: 98 mg/dL (ref 70–99)
Potassium: 4.6 mmol/L (ref 3.5–5.1)
Sodium: 126 mmol/L — ABNORMAL LOW (ref 135–145)
Total Bilirubin: 0.6 mg/dL (ref 0.3–1.2)
Total Protein: 5.3 g/dL — ABNORMAL LOW (ref 6.5–8.1)

## 2018-04-21 LAB — CBC WITH DIFFERENTIAL/PLATELET
Basophils Absolute: 0 10*3/uL (ref 0.0–0.1)
Basophils Relative: 0 %
EOS ABS: 0.3 10*3/uL (ref 0.0–0.7)
EOS PCT: 5 %
HCT: 31.7 % — ABNORMAL LOW (ref 36.0–46.0)
Hemoglobin: 10.1 g/dL — ABNORMAL LOW (ref 12.0–15.0)
LYMPHS PCT: 34 %
Lymphs Abs: 1.9 10*3/uL (ref 0.7–4.0)
MCH: 25.6 pg — AB (ref 26.0–34.0)
MCHC: 31.9 g/dL (ref 30.0–36.0)
MCV: 80.3 fL (ref 78.0–100.0)
MONO ABS: 0.5 10*3/uL (ref 0.1–1.0)
Monocytes Relative: 9 %
Neutro Abs: 3 10*3/uL (ref 1.7–7.7)
Neutrophils Relative %: 52 %
PLATELETS: 278 10*3/uL (ref 150–400)
RBC: 3.95 MIL/uL (ref 3.87–5.11)
RDW: 16.7 % — ABNORMAL HIGH (ref 11.5–15.5)
WBC: 5.7 10*3/uL (ref 4.0–10.5)

## 2018-04-21 LAB — MAGNESIUM: MAGNESIUM: 1.9 mg/dL (ref 1.7–2.4)

## 2018-04-21 NOTE — Progress Notes (Signed)
LCSW following for SNF placement.   LCSW provided bed offers and went over list with patient.   Patient to discuss with spouse and notify LCSW of bed choice.   Sarah Miranda, LSCW [facility] [phone]

## 2018-04-21 NOTE — Progress Notes (Addendum)
[facility] Progress Note  Patient: Sarah Miranda ZOX:[id number]   PCP: Rodrigo RanPerini, Mark, MD DOB: March 16, 1946   DOA: 04/18/2018   DOS: 04/21/2018   Date of Service: the patient was seen and examined on 04/21/2018  Subjective: Still in pain, no nausea no vomiting no fever no chills.  Urinating okay.  No BM.  Brief hospital course: Pt. with PMH of fibromyalgia, recurrent compression fractures, bipolar disorder; admitted on 04/18/2018, presented with complaint of fall, was found to have new compression fractures in T11 and L1. Currently further plan is continue current pain regimen with changes.  Assessment and Plan: Acute on chronic vertebral fractures- multiple fractures.   from osteoporosis, doubt pathologic fractures at this time.   MRI thoracic and lumbar spine - moderate compression fracture T8- progressed, Interval development of inferior endplate fracture W1111- acute or subacute fracture. Chronic compression fractures of T7, T10, T12, Mild compression fracture of L1- which has developed, Chronic fracture L1 unchanged.  Patient able to void on her own.   No radiculopathy or lower extremity weakness.   Reports she has kept up with her routine screenings. - Neurosurgeon, Dr. Dutch QuintPoole, consulted in ED- Aspen LSO w/ thoracic extender, f/u in 2 weeks. - IV Dilaudid 0.5 every 3 hours as needed, will continue that due to severe pain with movement, resume home percocet.  - on scheduled robaxin for now  - PT evaluation, SNF recommended  - Patient needs to follow-up with DEXA scan's, and likely start bisphosphonates or require more aggressive therapy - Would avoid NSAIDs at this time, recent admission 6/21- 6/22 for hematochezia - Continue Lyrica and amitriptyline - her fibromyalgia is complicating the pain control. Patient does have poor rectal tone although no other acute findings. Discussed with neurosurgery again myself and recommend outpatient follow-up as CT and MRIs are negative for any  acute injury to spine.  Hypothyroidism-last TSH 02/24/2018 - 0.86. - continue Synthroid  BPD-  -Continue Lyrica amitriptyline risperidone Prozac  Fibromyalgia -Continue Lyrica and amitriptyline  HTN-blood pressure 130s to 160s.  Not on antihypertensives. -Monitor - mildly tachycardic due to uncontrolled pain  Recent hematochezia-admission 6/21-6/22-EGD results- LA Grade B reflux esophagitis, Barrett's esophagus (biopsy consistent). Colonoscopy revealed-Congested, erythematous and granular mucosa in the descending colon ( biopsy- benign). Recommended discontinue NSAIDs, cont Protonix.  Urinary retention.  Failed in and out catheterization x4. Urology was consulted while the patient was being seen by urology for Foley catheter placement patient voided 400 cc.  Urology currently recommends to monitor and correct constipation and other etiologies.  Constipation. On stool softeners. Patient is refusing aggressive bowel regimen.  Hyponatremia. Likely from poor p.o. intake.  Will monitor for now.  Diet: regular diet DVT Prophylaxis: subcutaneous Heparin  Advance goals of care discussion: full code  Family Communication: family was present at bedside, at the time of interview. The pt provided permission to discuss medical plan with the family. Opportunity was given to ask question and all questions were answered satisfactorily.   Disposition:  Discharge to SNF tomorrow.  Consultants: neurosurgery urology Procedures: none  Antibiotics: Anti-infectives (From admission, onward)   Start     Dose/Rate Route Frequency Ordered Stop   04/20/18 1000  fluconazole (DIFLUCAN) IVPB 200 mg     200 mg 100 mL/hr over 60 Minutes Intravenous  Once 04/20/18 0908 04/20/18 1347       Objective: Physical Exam: Vitals:   04/20/18 1400 04/20/18 2001 04/21/18 0655 04/21/18 1455  BP: (!) 135/107 (!) 144/82 139/77 126/62  Pulse: 96 92  75 91  Resp: 17 18  18   Temp: 97.7 F (36.5 C) (!) 97.3  F (36.3 C)    TempSrc:  Oral    SpO2: (!) 89% 98%    Weight:      Height:        Intake/Output Summary (Last 24 hours) at 04/21/2018 1813 Last data filed at 04/21/2018 1608 Gross per 24 hour  Intake 240 ml  Output 1700 ml  Net -1460 ml   Filed Weights   04/18/18 0357  Weight: 70.8 kg   General: Alert, Awake and Oriented to Time, Place and Person. Appear in moderate distress, affect appropriate Eyes: PERRL, Conjunctiva normal ENT: Oral Mucosa clear moist. Neck: no JVD, no Abnormal Mass Or lumps Cardiovascular: S1 and S2 Present, no Murmur, Peripheral Pulses Present Respiratory: normal respiratory effort, Bilateral Air entry equal and Decreased, no use of accessory muscle, Clear to Auscultation, no Crackles, no wheezes Abdomen: Bowel Sound present, Soft and no tenderness, no hernia Skin: no redness, no Rash, no induration Extremities: no Pedal edema, no calf tenderness Neurologic: Grossly no focal neuro deficit. Bilaterally Equal motor strength  Data Reviewed: CBC: Recent Labs  Lab 04/18/18 0712 04/21/18 0557  WBC 6.3 5.7  NEUTROABS 3.8 3.0  HGB 10.7* 10.1*  HCT 34.9* 31.7*  MCV 81.5 80.3  PLT 294 278   Basic Metabolic Panel: Recent Labs  Lab 04/18/18 0712 04/21/18 0557  NA 140 126*  K 3.8 4.6  CL 103 93*  CO2 27 25  GLUCOSE 99 98  BUN 10 21  CREATININE 0.68 0.86  CALCIUM 8.5* 8.5*  MG  --  1.9    Liver Function Tests: Recent Labs  Lab 04/21/18 0557  AST 14*  ALT 9  ALKPHOS 50  BILITOT 0.6  PROT 5.3*  ALBUMIN 2.7*   No results for input(s): LIPASE, AMYLASE in the last 168 hours. No results for input(s): AMMONIA in the last 168 hours. Coagulation Profile: No results for input(s): INR, PROTIME in the last 168 hours. Cardiac Enzymes: No results for input(s): CKTOTAL, CKMB, CKMBINDEX, TROPONINI in the last 168 hours. BNP (last 3 results) No results for input(s): PROBNP in the last 8760 hours. CBG: No results for input(s): GLUCAP in the last 168  hours. Studies: No results found.  Scheduled Meds: . amitriptyline  25 mg Oral QHS  . docusate sodium  100 mg Oral BID  . enoxaparin (LOVENOX) injection  40 mg Subcutaneous Q24H  . FLUoxetine  40 mg Oral Daily  . levothyroxine  25 mcg Oral QAC breakfast  . methocarbamol  500 mg Oral TID  . pantoprazole  40 mg Oral Daily  . pregabalin  150 mg Oral Daily  . risperiDONE  1 mg Oral BID  . tamsulosin  0.4 mg Oral Daily   Continuous Infusions: PRN Meds: acetaminophen **OR** acetaminophen, HYDROmorphone (DILAUDID) injection, ondansetron **OR** ondansetron (ZOFRAN) IV, oxyCODONE-acetaminophen, senna  Time spent: 35 minutes  Author: Lynden OxfordPranav Kenan Moodie, MD [facility] Pager: [phone] 04/21/2018 6:13 PM  If 7PM-7AM, please contact night-coverage at [email or web address], password [email or web address]

## 2018-04-21 NOTE — Care Management Note (Signed)
Case Management Note  Patient Details  Name: Sarah Miranda MRN: [id number] Date of Birth: 22-Jun-1946  Subjective/Objective:                  Back pain and poor pain control  Action/Plan: Awaiting snf placment  Expected Discharge Date:  (unknown)               Expected Discharge Plan:  Home/Self Care  In-House Referral:     Discharge planning Services  CM Consult  Post Acute Care Choice:    Choice offered to:     DME Arranged:    DME Agency:     HH Arranged:    HH Agency:     Status of Service:  In process, will continue to follow  If discussed at Long Length of Stay Meetings, dates discussed:    Additional Comments:  Golda AcreDavis, Joselito Fieldhouse Lynn, RN 04/21/2018, 12:08 PM

## 2018-04-22 LAB — CBC WITH DIFFERENTIAL/PLATELET
Basophils Absolute: 0 10*3/uL (ref 0.0–0.1)
Basophils Relative: 0 %
Eosinophils Absolute: 0.2 10*3/uL (ref 0.0–0.7)
Eosinophils Relative: 4 %
HEMATOCRIT: 32 % — AB (ref 36.0–46.0)
Hemoglobin: 10.1 g/dL — ABNORMAL LOW (ref 12.0–15.0)
LYMPHS PCT: 33 %
Lymphs Abs: 1.6 10*3/uL (ref 0.7–4.0)
MCH: 25.1 pg — AB (ref 26.0–34.0)
MCHC: 31.6 g/dL (ref 30.0–36.0)
MCV: 79.6 fL (ref 78.0–100.0)
MONO ABS: 0.5 10*3/uL (ref 0.1–1.0)
MONOS PCT: 10 %
NEUTROS ABS: 2.5 10*3/uL (ref 1.7–7.7)
Neutrophils Relative %: 53 %
Platelets: 301 10*3/uL (ref 150–400)
RBC: 4.02 MIL/uL (ref 3.87–5.11)
RDW: 16.9 % — AB (ref 11.5–15.5)
WBC: 4.7 10*3/uL (ref 4.0–10.5)

## 2018-04-22 LAB — COMPREHENSIVE METABOLIC PANEL
ALT: 9 U/L (ref 0–44)
ANION GAP: 8 (ref 5–15)
AST: 14 U/L — ABNORMAL LOW (ref 15–41)
Albumin: 2.7 g/dL — ABNORMAL LOW (ref 3.5–5.0)
Alkaline Phosphatase: 50 U/L (ref 38–126)
BILIRUBIN TOTAL: 0.4 mg/dL (ref 0.3–1.2)
BUN: 18 mg/dL (ref 8–23)
CO2: 25 mmol/L (ref 22–32)
Calcium: 8.4 mg/dL — ABNORMAL LOW (ref 8.9–10.3)
Chloride: 95 mmol/L — ABNORMAL LOW (ref 98–111)
Creatinine, Ser: 0.67 mg/dL (ref 0.44–1.00)
GFR calc Af Amer: >60 mL/min (ref >60)
GFR calc non Af Amer: >60 mL/min (ref >60)
Glucose, Bld: 84 mg/dL (ref 70–99)
POTASSIUM: 4.6 mmol/L (ref 3.5–5.1)
Sodium: 128 mmol/L — ABNORMAL LOW (ref 135–145)
TOTAL PROTEIN: 5.2 g/dL — AB (ref 6.5–8.1)

## 2018-04-22 LAB — MAGNESIUM: Magnesium: 2 mg/dL (ref 1.7–2.4)

## 2018-04-22 MED ORDER — TAMSULOSIN HCL 0.4 MG PO CAPS: 0.4000 mg | ORAL_CAPSULE | Freq: Every day | ORAL | 0 refills | Status: AC

## 2018-04-22 MED ORDER — OXYCODONE-ACETAMINOPHEN 5-325 MG PO TABS: 1.0000 | ORAL_TABLET | ORAL | 0 refills | Status: DC | PRN

## 2018-04-22 MED ORDER — METHOCARBAMOL 500 MG PO TABS: 500.0000 mg | ORAL_TABLET | Freq: Three times a day (TID) | ORAL | 0 refills | Status: AC

## 2018-04-22 MED ORDER — ACETAMINOPHEN 325 MG PO TABS: 650.0000 mg | ORAL_TABLET | Freq: Four times a day (QID) | ORAL | Status: DC | PRN

## 2018-04-22 MED ORDER — DOCUSATE SODIUM 100 MG PO CAPS: 100.0000 mg | ORAL_CAPSULE | Freq: Two times a day (BID) | ORAL | 0 refills | Status: DC

## 2018-04-22 NOTE — Progress Notes (Signed)
[facility]  Spoke with three different people.  Was transferred to the RN who is accepting this patient.  No answer.

## 2018-04-22 NOTE — Progress Notes (Signed)
[facility]  Family notified at bedside and requests transport via [facility]. CSW will call [facility] for transport. CSW faxed d/c summary to facility.  [facility] [place] [facility] [place] [address] [facility] [address]  RN call report to #[phone] and [facility].  No other CSW needs, signing off.    Sarah CutterLindsey Jailynn Miranda, MSW, LCSWA Clinical Social Work [phone]

## 2018-04-22 NOTE — Discharge Summary (Signed)
[facility] Discharge Summary   Patient: Sarah Miranda ZOX:[id number]   PCP: Rodrigo RanPerini, Mark, MD DOB: Sep 07, 1945   Date of admission: 04/18/2018   Date of discharge:  04/22/2018    Discharge Diagnoses:  Active Problems:   Depression   Fibromyalgia   Hypothyroidism   Bipolar disorder (HCC)   Essential hypertension   Acute back pain   Admitted From: home Disposition:  SNF  Recommendations for Outpatient Follow-up:  1. Please follow-up with PCP in 1 week, neurosurgery in 2 weeks.   Contact information for follow-up providers    Rodrigo RanPerini, Mark, MD. Schedule an appointment as soon as possible for a visit in 1 week(s).   Specialty:  Internal Medicine Contact information: [address] [place] [address] [phone]        Julio SicksPool, Henry, MD. Schedule an appointment as soon as possible for a visit in 2 week(s).   Specialty:  Neurosurgery Contact information: [address] [place] [address] [phone]            Contact information for after-discharge care    Destination    [facility]erred [facility] .   Service:  Skilled Nursing Contact information: [address] [place] [id number] [phone]                 Diet recommendation: regular diet  Activity: The patient is advised to gradually reintroduce usual activities.  Discharge Condition: good  Code Status: full code  History of present illness: As per the H and P dictated on admission, "Rochel BromeBrenda P Francoise SchaumannBaird is a 72 y.o. female with medical history significant for chronic back pain with multiple vertebral spine fractures, fibromyalgia, bipolar disorder and depression who presented to the ED today via EMS with complaints of sudden worsening back pain.  Patient was sleeping when she had her husband for of the bed (he is ok), she is start up suddenly from sleep, and had sudden back pain.  Pain so severe, she was unable to move.  EMS was called.  She denies weakness  of her extremities. Pain does not radiate down her legs.  Does not think she has bowel problems, denies urinary problems.  Patient was already planned for an MRI this morning.  Patient tells me she is scheduled for DEXA scan, with plans to start some medications soon considering multiple fractures over the past the.  She has kept up with her screening colonoscopy mammography and Pap smears.  No weight loss.  ED Course: Initial tachycardia 108 hypertensive to 160s.  Unremarkable CBC BMP.  MRI lumbar and thoracic spine-T11 fracture acute or subacute, new mild compression fracture of L1, also chronic L1 unchanged fracture, progressed moderate compression fracture T8, chronic compression fractures T7, T10, T12, neurosurgery- Dr. Dutch QuintPoole, was consulted recommended [facility]SO w/ thoracic extender follow-up in 2 weeks.  Improved with Dilaudid and Toradol.  Hospitalist called to admit"  Hospital Course:  Summary of her active problems in the hospital is as following. Acute T11 L1 compression fracture. Multiple chronic vertebral fractures Osteoporosis from osteoporosis,doubt pathologic fractures at this time. MRI thoracic and lumbar spine -moderate compression fracture T8-progressed,Interval development of inferior endplate fracture [id number]-acute or subacute fracture.  Chronic compression fractures of T7, T10, T12, Mild compression fracture of L1-which has developed, Chronic fracture L1 unchanged. Patient able to void on her own. No radiculopathy or lower extremity weakness. Reports she has kept up with her routine screenings. - Neurosurgeon, Dr. Sharyon CablePoole,consulted in ED-[facility] w/ thoracic extender, f/u  in 2 weeks. - on scheduled robaxin for now as well as every 4 hours Percocet which I will continue on discharge. - PT evaluation, SNF recommended  - Patient needs to follow-up with DEXA scan's,and likely start bisphosphonates orrequiremore aggressive therapy - Would avoid NSAIDs at this  time,recent admission6/21- 6/4922for hematochezia - Continue Lyrica and amitriptyline - her fibromyalgia is complicating the pain control. Patient does have poor rectal tone although no incontinence of stool.  Discussed with neurosurgery again myself and recommend outpatient follow-up as CT and MRIs are negative for any acute injury to spine.  Hypothyroidism-last TSH 02/24/2018- 0.86. -continue Synthroid  BPD-  -Continue Lyrica amitriptyline risperidone Prozac  Fibromyalgia -Continue Lyrica and amitriptyline  HTN- Blood pressure and heart rate now better and that the pain is controlled.  Not on any medication for now.  Recent hematochezia-admission 6/21-6/22-EGD results-LA Grade B reflux esophagitis,Barrett's esophagus (biopsy consistent). Colonoscopy revealed-Congested, erythematous and granular mucosa in the descending colon( biopsy- benign).Recommended discontinue NSAIDs, contProtonix.  Urinary retention.  Failed in and out catheterization x4. Urology was consulted  while the patient was being seen by urology for Foley catheter placement; patient voided 400 cc spontaneously. Urology currently recommends to monitor and correct constipation and other etiologies. So far no retention issues.  Flomax for short course.  Constipation. On stool softeners. Patient is refusing aggressive bowel regimen.  Hyponatremia. Likely from poor p.o. intake.   Recommended with regular diet with salt.  All other chronic medical condition were stable during the hospitalization.  Patient was seen by physical therapy, who recommended SNF, which was arranged by Child psychotherapistsocial worker and case Production designer, theatre/television/filmmanager. On the day of the discharge the patient's vitals were stable , and no other acute medical condition were reported by patient. the patient was felt safe to be discharge at SNF with therapy.  Consultants: Phone consultation with neurosurgery Phone consultation with neurology Procedures:  none  DISCHARGE MEDICATION: Allergies as of 04/22/2018      Reactions   Fetzima [levomilnacipran] Nausea Only      Medication List    TAKE these medications   acetaminophen 325 MG tablet Commonly known as:  TYLENOL Take 2 tablets (650 mg total) by mouth every 6 (six) hours as needed for mild pain (or Fever >/= 101). What changed:    medication strength  how much to take  when to take this  reasons to take this   amitriptyline 25 MG tablet Commonly known as:  ELAVIL TAKE 1 TABLET(25 MG) BY MOUTH AT BEDTIME What changed:  See the new instructions.   docusate sodium 100 MG capsule Commonly known as:  COLACE Take 1 capsule (100 mg total) by mouth 2 (two) times daily.   FLUoxetine 40 MG capsule Commonly known as:  PROZAC TAKE 1 CAPSULE BY MOUTH DAILY TO HELP NERVES AND DEPRESSION What changed:  See the new instructions.   levothyroxine 25 MCG tablet Commonly known as:  SYNTHROID, LEVOTHROID TAKE 1 TABLET BY MOUTH EVERY DAY   methocarbamol 500 MG tablet Commonly known as:  ROBAXIN Take 1 tablet (500 mg total) by mouth 3 (three) times daily for 7 days.   multivitamin with minerals Tabs tablet Take 1 tablet by mouth daily.   MUSCLE RUB 10-15 % Crea Apply 1 application topically as needed for muscle pain.   ondansetron 4 MG tablet Commonly known as:  ZOFRAN TAKE 1 TABLET BY MOUTH EVERY 8 HOURS AS NEEDED FOR NAUSEA   oxyCODONE-acetaminophen 5-325 MG tablet Commonly known as:  PERCOCET/ROXICET Take 1 tablet  by mouth every 4 (four) hours as needed for moderate pain or severe pain.   pantoprazole 40 MG tablet Commonly known as:  PROTONIX Take 1 tablet (40 mg total) by mouth daily.   pregabalin 150 MG capsule Commonly known as:  LYRICA Take 1 capsule (150 mg total) by mouth daily.   risperiDONE 1 MG tablet Commonly known as:  RISPERDAL TAKE 1 TABLET BY MOUTH TWICE DAILY TO HELP RELAX What changed:  See the new instructions.   senna 8.6 MG Tabs  tablet Commonly known as:  SENOKOT Take 1 tablet (8.6 mg total) by mouth daily as needed for mild constipation.   tamsulosin 0.4 MG Caps capsule Commonly known as:  FLOMAX Take 1 capsule (0.4 mg total) by mouth daily for 5 days. Start taking on:  04/23/2018      Allergies  Allergen Reactions  . Fetzima [Levomilnacipran] Nausea Only   Discharge Instructions    Diet - low sodium heart healthy   Complete by:  As directed    Discharge instructions   Complete by:  As directed    It is important that you read following instructions as well as go over your medication list with RN to help you understand your care after this hospitalization.  Discharge Instructions: Please follow-up with PCP in one week  Please request your primary care physician to go over all Hospital Tests and Procedure/Radiological results at the follow up,  Please get all Hospital records sent to your PCP by signing hospital release before you go home.   Do not drive, operating heavy machinery, perform activities at heights, swimming or participation in water activities or provide baby sitting services while you are on Pain, Sleep and Anxiety Medications; until you have been seen by Primary Care Physician or a Neurologist and advised to do so again. Do not take more than prescribed Pain, Sleep and Anxiety Medications. You were cared for by a hospitalist during your hospital stay. If you have any questions about your discharge medications or the care you received while you were in the hospital after you are discharged, you can call the unit and ask to speak with the hospitalist on call if the hospitalist that took care of you is not available.  Once you are discharged, your primary care physician will handle any further medical issues. Please note that NO REFILLS for any discharge medications will be authorized once you are discharged, as it is imperative that you return to your primary care physician (or establish a  relationship with a primary care physician if you do not have one) for your aftercare needs so that they can reassess your need for medications and monitor your lab values. You Must read complete instructions/literature along with all the possible adverse reactions/side effects for all the Medicines you take and that have been prescribed to you. Take any new Medicines after you have completely understood and accept all the possible adverse reactions/side effects. Wear Seat belts while driving. If you have smoked or chewed Tobacco in the last 2 yrs please stop smoking and/or stop any Recreational drug use.   Increase activity slowly   Complete by:  As directed      Discharge Exam: Filed Weights   04/18/18 0357  Weight: 70.8 kg   Vitals:   04/21/18 2005 04/22/18 0640  BP: 121/74 (!) 145/76  Pulse: (!) 101 81  Resp: (!) 21 (!) 21  Temp: 97.8 F (36.6 C) 97.6 F (36.4 C)  SpO2: 100% 98%  General: Appear in no distress, no Rash; Oral Mucosa moist. Cardiovascular: S1 and S2 Present, no Murmur, no JVD Respiratory: Bilateral Air entry present and Clear to Auscultation, no Crackles, no wheezes Abdomen: Bowel Sound present, Soft and no tenderness Extremities: no Pedal edema, no calf tenderness Neurology: Grossly no focal neuro deficit. Low back tenderness   The results of significant diagnostics from this hospitalization (including imaging, microbiology, ancillary and laboratory) are listed below for reference.    Significant Diagnostic Studies: Mr Thoracic Spine Wo Contrast  Result Date: 04/18/2018 CLINICAL DATA:  Back pain.  History of fractures. EXAM: MRI THORACIC AND LUMBAR SPINE WITHOUT CONTRAST TECHNIQUE: Multiplanar and multiecho pulse sequences of the thoracic and lumbar spine were obtained without intravenous contrast. COMPARISON:  CT chest 02/24/2018.  CT lumbar 02/24/2018 FINDINGS: MRI THORACIC SPINE FINDINGS Alignment:  Moderate dextroscoliosis.  Normal sagittal alignment.  Vertebrae: Multiple fractures appear benign. Mild chronic fracture T7 unchanged Moderate fracture of T8 with bone marrow edema. This appears to have progressed in the interval Mild compression fracture T10 appears chronic Extensive edema in the inferior endplate of T11 with associative fracture which has developed since the prior study. Superior endplate fracture J81 is unchanged and chronic. T12 compression fracture is chronic and unchanged Cord:  Negative for cord compression.  Cord signal is normal. Paraspinal and other soft tissues: Negative Disc levels: Negative for spinal stenosis. Negative for disc protrusion. Mild disc degeneration is present at T8-9 MRI LUMBAR SPINE FINDINGS Segmentation:  Normal Alignment:  Normal Vertebrae: Mild compression fracture of L1 with bone marrow edema has developed since the prior CT and therefore appears to be acute or subacute. Chronic fracture L1 is moderately severe and unchanged. Conus medullaris and cauda equina: Conus extends to the L1-2 level. Conus and cauda equina appear normal. Paraspinal and other soft tissues: Negative for paraspinous mass. Left renal cyst. Markedly distended urinary bladder. Disc levels: T12-L1: Negative for stenosis L1-2: Mild bony retropulsion of L2 into the canal without significant stenosis L2-3: Mild disc bulging without stenosis. L3-4: Mild disc degeneration. Small annular tear on the left without significant disc protrusion or stenosis L4-5: Mild disc degeneration without stenosis. L5-S1: Negative IMPRESSION: MR THORACIC SPINE IMPRESSION Moderate compression fracture T8 with bone marrow edema. This appears to have progressed in the interval since the prior CT of 02/24/2018. Interval development of inferior endplate fracture X91 with associated edema suggesting acute or subacute fracture. Chronic compression fractures of T7, T10, T12 MR LUMBAR SPINE IMPRESSION Mild compression fracture of L1 with bone marrow edema which has developed since  the prior CT. Chronic fracture L1 unchanged Markedly distended urinary bladder.  Recommend Foley catheter Electronically Signed   By: Marlan Palau M.D.   On: 04/18/2018 08:40   Mr Lumbar Spine Wo Contrast  Result Date: 04/18/2018 CLINICAL DATA:  Back pain.  History of fractures. EXAM: MRI THORACIC AND LUMBAR SPINE WITHOUT CONTRAST TECHNIQUE: Multiplanar and multiecho pulse sequences of the thoracic and lumbar spine were obtained without intravenous contrast. COMPARISON:  CT chest 02/24/2018.  CT lumbar 02/24/2018 FINDINGS: MRI THORACIC SPINE FINDINGS Alignment:  Moderate dextroscoliosis.  Normal sagittal alignment. Vertebrae: Multiple fractures appear benign. Mild chronic fracture T7 unchanged Moderate fracture of T8 with bone marrow edema. This appears to have progressed in the interval Mild compression fracture T10 appears chronic Extensive edema in the inferior endplate of T11 with associative fracture which has developed since the prior study. Superior endplate fracture Y78 is unchanged and chronic. T12 compression fracture is chronic and unchanged Cord:  Negative for cord compression.  Cord signal is normal. Paraspinal and other soft tissues: Negative Disc levels: Negative for spinal stenosis. Negative for disc protrusion. Mild disc degeneration is present at T8-9 MRI LUMBAR SPINE FINDINGS Segmentation:  Normal Alignment:  Normal Vertebrae: Mild compression fracture of L1 with bone marrow edema has developed since the prior CT and therefore appears to be acute or subacute. Chronic fracture L1 is moderately severe and unchanged. Conus medullaris and cauda equina: Conus extends to the L1-2 level. Conus and cauda equina appear normal. Paraspinal and other soft tissues: Negative for paraspinous mass. Left renal cyst. Markedly distended urinary bladder. Disc levels: T12-L1: Negative for stenosis L1-2: Mild bony retropulsion of L2 into the canal without significant stenosis L2-3: Mild disc bulging without  stenosis. L3-4: Mild disc degeneration. Small annular tear on the left without significant disc protrusion or stenosis L4-5: Mild disc degeneration without stenosis. L5-S1: Negative IMPRESSION: MR THORACIC SPINE IMPRESSION Moderate compression fracture T8 with bone marrow edema. This appears to have progressed in the interval since the prior CT of 02/24/2018. Interval development of inferior endplate fracture Z61 with associated edema suggesting acute or subacute fracture. Chronic compression fractures of T7, T10, T12 MR LUMBAR SPINE IMPRESSION Mild compression fracture of L1 with bone marrow edema which has developed since the prior CT. Chronic fracture L1 unchanged Markedly distended urinary bladder.  Recommend Foley catheter Electronically Signed   By: Marlan Palau M.D.   On: 04/18/2018 08:40    Microbiology: No results found for this or any previous visit (from the past 240 hour(s)).   Labs: CBC: Recent Labs  Lab 04/18/18 0712 04/21/18 0557 04/22/18 0534  WBC 6.3 5.7 4.7  NEUTROABS 3.8 3.0 2.5  HGB 10.7* 10.1* 10.1*  HCT 34.9* 31.7* 32.0*  MCV 81.5 80.3 79.6  PLT 294 278 301   Basic Metabolic Panel: Recent Labs  Lab 04/18/18 0712 04/21/18 0557 04/22/18 0534  NA 140 126* 128*  K 3.8 4.6 4.6  CL 103 93* 95*  CO2 27 25 25   GLUCOSE 99 98 84  BUN 10 21 18   CREATININE 0.68 0.86 0.67  CALCIUM 8.5* 8.5* 8.4*  MG  --  1.9 2.0   Liver Function Tests: Recent Labs  Lab 04/21/18 0557 04/22/18 0534  AST 14* 14*  ALT 9 9  ALKPHOS 50 50  BILITOT 0.6 0.4  PROT 5.3* 5.2*  ALBUMIN 2.7* 2.7*   Time spent: 35 minutes  Signed:  Lynden Oxford  [facility]  04/22/2018  , 11:16 AM

## 2018-04-22 NOTE — Clinical Social Work Placement (Signed)
Patient chose bed at [facility].  CSW confirmed bed at facility. Patient will admit to room 606-P.  CSW faxed documents to facility.  Patient will transport by [facility].  RN report #: [id number]   CLINICAL SOCIAL WORK PLACEMENT  NOTE  Date:  04/22/2018  Patient Details  Name: Sarah Miranda MRN: [id number] Date of Birth: 18-May-1946  Clinical Social Work is seeking post-discharge placement for this patient at the Skilled  Nursing Facility level of care (*CSW will initial, date and re-position this form in  chart as items are completed):  Yes   Patient/family provided with [facility] Clinical Social Work Department's list of facilities offering this level of care within the geographic area requested by the patient (or if unable, by the patient's family).  Yes   Patient/family informed of their freedom to choose among providers that offer the needed level of care, that participate in Medicare, Medicaid or managed care program needed by the patient, have an available bed and are willing to accept the patient.  Yes   Patient/family informed of [facility]'s ownership interest in [facility] and [facility], as well as of the fact that they are under no obligation to receive care at these facilities.  PASRR submitted to EDS on       PASRR number received on 04/21/18     Existing PASRR number confirmed on       FL2 transmitted to all facilities in geographic area requested by pt/family on 04/21/18     FL2 transmitted to all facilities within larger geographic area on 04/21/18     Patient informed that his/her managed care company has contracts with or will negotiate with certain facilities, including the following:        Yes   Patient/family informed of bed offers received.  Patient [facility] bed [facility]     Physician recommends and patient chooses bed at      [facility] on 04/22/18.  Patient to be transferred to facility by PTAR       Patient family notified on 04/22/18 of transfer.  Name of family member notified:  Dulcy FannyBill Stogner     PHYSICIAN       Additional Comment:    _______________________________________________ Enid CutterLindsey Nanette Wirsing, LCSW 04/22/2018, 12:27 PM

## 2018-04-22 NOTE — Progress Notes (Signed)
Attempted to call report to [facility].  Left my phone number for RN to call for report.

## 2018-04-22 NOTE — Progress Notes (Signed)
Attempted to call report to [facility]. No answer. °

## 2018-04-22 NOTE — Discharge Instructions (Signed)
How to Use a Back Brace °A back brace is a form-fitting device that wraps around your trunk to support your lower back, abdomen, and hips. You may need to wear a back brace to relieve back pain or to correct a medical condition related to the back, such as abnormal curvature of the spine (scoliosis). A back brace can maintain or correct the shape of the spine and prevent a spinal problem from getting worse. A back brace can also take pressure off the layers of tissue (disks) between the bones of the spine (vertebrae). You may need a back brace to keep your back and spine in place while you heal from an injury or recover from surgery. °Back braces can be either plastic (rigid brace) or soft elastic (dynamic brace). A rigid brace usually covers both the front and back of the entire upper body. A soft brace may cover only the lower back and abdomen and may fasten with self-adhesive elastic straps. Your health care provider will recommend the proper brace for your needs and medical condition. °What are the risks? °· A back brace may not help if you do not wear it as directed by your health care provider. Be sure to wear the brace exactly as instructed in order to prevent further back problems. °· Wearing the brace may be uncomfortable at first. You may have trouble sleeping with it on. It may also be hard for you to do certain activities while wearing the brace. °How to use a back brace °Different types of braces will have different instructions for use. Follow instructions from your health care provider about: °· How to put on the brace. °· When and how often to wear the brace. In some cases, braces may need to be worn for long stretches of time. For example, a brace may need to be worn for 16-23 hours a day when used for scoliosis. °· How to take off the brace. °· Any safety tips you should follow when wearing the brace. This may include: °? Moving carefully while wearing the brace. The brace restricts your movement  and could lead to additional injuries. °? Using a cane or walker for support if you feel unsteady. °? Sitting in high, firm chairs. It may be difficult to stand up from low, soft chairs. ° °How to care for a back brace °· Do not let the back brace get wet. Typically, you will remove the brace for bathing and then put it back on afterward. °· If you have a rigid brace, be sure to store it in a safe place when you are not wearing it. This will help to prevent damage. °· Clean or wash the back brace with mild soap and water as told by your health care provider. °Contact a health care provider if: °· Your brace gets damaged. °· You have pain or discomfort when wearing the back brace. °· Your back pain is getting worse or is not improving over time. °This information is not intended to replace advice given to you by your health care provider. Make sure you discuss any questions you have with your health care provider. °Document Released: 08/12/2011 Document Revised: 09/18/2015 Document Reviewed: 04/16/2015 °Elsevier Interactive Patient Education © 2018 [employer] ° ° ° °

## 2018-04-23 ENCOUNTER — Other Ambulatory Visit: Payer: Medicare Other

## 2018-04-26 ENCOUNTER — Other Ambulatory Visit: Payer: Medicare Other

## 2018-05-04 ENCOUNTER — Telehealth: Payer: Self-pay | Admitting: Internal Medicine

## 2018-05-04 NOTE — Telephone Encounter (Signed)
Per patient transferred her PCP from Dr. Renato Gailseed to Dr. Waynard EdwardsPerini...cdavis

## 2018-07-13 ENCOUNTER — Other Ambulatory Visit: Payer: Self-pay | Admitting: Internal Medicine

## 2018-07-13 DIAGNOSIS — R110 Nausea: Secondary | ICD-10-CM

## 2018-07-14 ENCOUNTER — Other Ambulatory Visit: Payer: Self-pay | Admitting: Internal Medicine

## 2018-07-14 DIAGNOSIS — R110 Nausea: Secondary | ICD-10-CM

## 2018-09-21 ENCOUNTER — Observation Stay (HOSPITAL_COMMUNITY): Payer: Medicare Other

## 2018-09-21 ENCOUNTER — Emergency Department (HOSPITAL_COMMUNITY): Payer: Medicare Other

## 2018-09-21 ENCOUNTER — Other Ambulatory Visit: Payer: Self-pay

## 2018-09-21 ENCOUNTER — Encounter (HOSPITAL_COMMUNITY): Payer: Self-pay

## 2018-09-21 ENCOUNTER — Observation Stay (HOSPITAL_COMMUNITY)
Admission: EM | Admit: 2018-09-21 | Discharge: 2018-09-22 | Disposition: A | Discharge status: Home / Self Care | Payer: Medicare Other | Attending: Internal Medicine | Admitting: Internal Medicine

## 2018-09-21 DIAGNOSIS — I447 Left bundle-branch block, unspecified: Secondary | ICD-10-CM | POA: Insufficient documentation

## 2018-09-21 DIAGNOSIS — N281 Cyst of kidney, acquired: Secondary | ICD-10-CM | POA: Diagnosis not present

## 2018-09-21 DIAGNOSIS — R339 Retention of urine, unspecified: Secondary | ICD-10-CM | POA: Insufficient documentation

## 2018-09-21 DIAGNOSIS — R2689 Other abnormalities of gait and mobility: Secondary | ICD-10-CM | POA: Insufficient documentation

## 2018-09-21 DIAGNOSIS — E039 Hypothyroidism, unspecified: Secondary | ICD-10-CM | POA: Diagnosis not present

## 2018-09-21 DIAGNOSIS — R059 Cough, unspecified: Secondary | ICD-10-CM

## 2018-09-21 DIAGNOSIS — R2681 Unsteadiness on feet: Secondary | ICD-10-CM | POA: Insufficient documentation

## 2018-09-21 DIAGNOSIS — E871 Hypo-osmolality and hyponatremia: Secondary | ICD-10-CM | POA: Diagnosis not present

## 2018-09-21 DIAGNOSIS — F319 Bipolar disorder, unspecified: Secondary | ICD-10-CM | POA: Diagnosis not present

## 2018-09-21 DIAGNOSIS — M6281 Muscle weakness (generalized): Secondary | ICD-10-CM | POA: Insufficient documentation

## 2018-09-21 DIAGNOSIS — Z79890 Hormone replacement therapy (postmenopausal): Secondary | ICD-10-CM | POA: Diagnosis not present

## 2018-09-21 DIAGNOSIS — I959 Hypotension, unspecified: Secondary | ICD-10-CM | POA: Diagnosis not present

## 2018-09-21 DIAGNOSIS — N179 Acute kidney failure, unspecified: Secondary | ICD-10-CM | POA: Diagnosis not present

## 2018-09-21 DIAGNOSIS — K449 Diaphragmatic hernia without obstruction or gangrene: Secondary | ICD-10-CM | POA: Diagnosis not present

## 2018-09-21 DIAGNOSIS — Z888 Allergy status to other drugs, medicaments and biological substances status: Secondary | ICD-10-CM | POA: Insufficient documentation

## 2018-09-21 DIAGNOSIS — K219 Gastro-esophageal reflux disease without esophagitis: Secondary | ICD-10-CM | POA: Insufficient documentation

## 2018-09-21 DIAGNOSIS — R0989 Other specified symptoms and signs involving the circulatory and respiratory systems: Secondary | ICD-10-CM | POA: Diagnosis not present

## 2018-09-21 DIAGNOSIS — F419 Anxiety disorder, unspecified: Secondary | ICD-10-CM | POA: Insufficient documentation

## 2018-09-21 DIAGNOSIS — M797 Fibromyalgia: Secondary | ICD-10-CM | POA: Diagnosis present

## 2018-09-21 DIAGNOSIS — R0602 Shortness of breath: Secondary | ICD-10-CM | POA: Insufficient documentation

## 2018-09-21 DIAGNOSIS — R05 Cough: Secondary | ICD-10-CM | POA: Diagnosis present

## 2018-09-21 DIAGNOSIS — F32A Depression, unspecified: Secondary | ICD-10-CM | POA: Diagnosis present

## 2018-09-21 DIAGNOSIS — Z79899 Other long term (current) drug therapy: Secondary | ICD-10-CM | POA: Diagnosis not present

## 2018-09-21 DIAGNOSIS — I080 Rheumatic disorders of both mitral and aortic valves: Secondary | ICD-10-CM | POA: Insufficient documentation

## 2018-09-21 DIAGNOSIS — I119 Hypertensive heart disease without heart failure: Secondary | ICD-10-CM | POA: Diagnosis not present

## 2018-09-21 DIAGNOSIS — F329 Major depressive disorder, single episode, unspecified: Secondary | ICD-10-CM | POA: Diagnosis present

## 2018-09-21 DIAGNOSIS — Z9049 Acquired absence of other specified parts of digestive tract: Secondary | ICD-10-CM | POA: Insufficient documentation

## 2018-09-21 DIAGNOSIS — G9341 Metabolic encephalopathy: Secondary | ICD-10-CM | POA: Diagnosis not present

## 2018-09-21 LAB — URINALYSIS, ROUTINE W REFLEX MICROSCOPIC
BILIRUBIN URINE: NEGATIVE
Glucose, UA: NEGATIVE mg/dL
Hgb urine dipstick: NEGATIVE
KETONES UR: NEGATIVE mg/dL
Leukocytes, UA: NEGATIVE
NITRITE: NEGATIVE
PH: 5 (ref 5.0–8.0)
Protein, ur: NEGATIVE mg/dL
Specific Gravity, Urine: 1.008 (ref 1.005–1.030)

## 2018-09-21 LAB — VITAMIN B12: Vitamin B-12: 434 pg/mL (ref 180–914)

## 2018-09-21 LAB — COMPREHENSIVE METABOLIC PANEL
ALT: 13 U/L (ref 0–44)
AST: 15 U/L (ref 15–41)
Albumin: 3.7 g/dL (ref 3.5–5.0)
Alkaline Phosphatase: 54 U/L (ref 38–126)
Anion gap: 12 (ref 5–15)
BILIRUBIN TOTAL: 0.7 mg/dL (ref 0.3–1.2)
BUN: 31 mg/dL — AB (ref 8–23)
CO2: 22 mmol/L (ref 22–32)
CREATININE: 2.57 mg/dL — AB (ref 0.44–1.00)
Calcium: 8.3 mg/dL — ABNORMAL LOW (ref 8.9–10.3)
Chloride: 88 mmol/L — ABNORMAL LOW (ref 98–111)
GFR, EST AFRICAN AMERICAN: 21 mL/min — AB (ref >60)
GFR, EST NON AFRICAN AMERICAN: 18 mL/min — AB (ref >60)
Glucose, Bld: 105 mg/dL — ABNORMAL HIGH (ref 70–99)
Potassium: 4 mmol/L (ref 3.5–5.1)
Sodium: 122 mmol/L — ABNORMAL LOW (ref 135–145)
Total Protein: 6.4 g/dL — ABNORMAL LOW (ref 6.5–8.1)

## 2018-09-21 LAB — PROTIME-INR
INR: 0.96
PROTHROMBIN TIME: 12.7 s (ref 11.4–15.2)

## 2018-09-21 LAB — CBC WITH DIFFERENTIAL/PLATELET
ABS IMMATURE GRANULOCYTES: 0.05 10*3/uL (ref 0.00–0.07)
BASOS ABS: 0 10*3/uL (ref 0.0–0.1)
BASOS PCT: 0 %
Eosinophils Absolute: 0.3 10*3/uL (ref 0.0–0.5)
Eosinophils Relative: 3 %
HCT: 38.9 % (ref 36.0–46.0)
Hemoglobin: 12.2 g/dL (ref 12.0–15.0)
Immature Granulocytes: 0 %
Lymphocytes Relative: 23 %
Lymphs Abs: 2.6 10*3/uL (ref 0.7–4.0)
MCH: 28.7 pg (ref 26.0–34.0)
MCHC: 31.4 g/dL (ref 30.0–36.0)
MCV: 91.5 fL (ref 80.0–100.0)
MONO ABS: 0.8 10*3/uL (ref 0.1–1.0)
Monocytes Relative: 7 %
NEUTROS ABS: 7.7 10*3/uL (ref 1.7–7.7)
NEUTROS PCT: 67 %
NRBC: 0 % (ref 0.0–0.2)
PLATELETS: 242 10*3/uL (ref 150–400)
RBC: 4.25 MIL/uL (ref 3.87–5.11)
RDW: 14.4 % (ref 11.5–15.5)
WBC: 11.5 10*3/uL — AB (ref 4.0–10.5)

## 2018-09-21 LAB — INFLUENZA PANEL BY PCR (TYPE A & B)
Influenza A By PCR: NEGATIVE
Influenza B By PCR: NEGATIVE

## 2018-09-21 LAB — I-STAT TROPONIN, ED: Troponin i, poc: 0.01 ng/mL (ref 0.00–0.08)

## 2018-09-21 LAB — BRAIN NATRIURETIC PEPTIDE: B Natriuretic Peptide: 34.3 pg/mL (ref 0.0–100.0)

## 2018-09-21 LAB — I-STAT CG4 LACTIC ACID, ED: LACTIC ACID, VENOUS: 1.11 mmol/L (ref 0.5–1.9)

## 2018-09-21 MED ORDER — SODIUM CHLORIDE 0.9 % IV BOLUS
1000.0000 mL | Freq: Once | INTRAVENOUS | Status: AC
  Administered 2018-09-21: 1000 mL via INTRAVENOUS

## 2018-09-21 MED ORDER — SODIUM CHLORIDE 0.9 % IV SOLN
INTRAVENOUS | Status: DC
  Administered 2018-09-21 (×2): via INTRAVENOUS

## 2018-09-21 MED ORDER — HEPARIN SODIUM (PORCINE) 5000 UNIT/ML IJ SOLN
5000.0000 [IU] | Freq: Three times a day (TID) | INTRAMUSCULAR | Status: DC
  Administered 2018-09-21 – 2018-09-22 (×2): 5000 [IU] via SUBCUTANEOUS
  Filled 2018-09-21 (×2): qty 1

## 2018-09-21 MED ORDER — OXYCODONE-ACETAMINOPHEN 5-325 MG PO TABS
1.0000 | ORAL_TABLET | Freq: Three times a day (TID) | ORAL | Status: DC | PRN
  Administered 2018-09-22: 1 via ORAL
  Filled 2018-09-21: qty 1

## 2018-09-21 MED ORDER — AMITRIPTYLINE HCL 25 MG PO TABS
25.0000 mg | ORAL_TABLET | Freq: Every day | ORAL | Status: DC
  Administered 2018-09-21: 25 mg via ORAL
  Filled 2018-09-21: qty 1

## 2018-09-21 MED ORDER — SODIUM CHLORIDE 0.9% FLUSH
3.0000 mL | Freq: Once | INTRAVENOUS | Status: AC
  Administered 2018-09-21: 3 mL via INTRAVENOUS

## 2018-09-21 MED ORDER — LEVOTHYROXINE SODIUM 25 MCG PO TABS
25.0000 ug | ORAL_TABLET | Freq: Every day | ORAL | Status: DC
  Administered 2018-09-22: 25 ug via ORAL
  Filled 2018-09-21: qty 1

## 2018-09-21 MED ORDER — ONDANSETRON HCL 4 MG PO TABS: 4.0000 mg | ORAL_TABLET | Freq: Four times a day (QID) | ORAL | Status: DC | PRN

## 2018-09-21 MED ORDER — RISPERIDONE 1 MG PO TABS: 1.0000 mg | ORAL_TABLET | Freq: Every day | ORAL | Status: DC | PRN

## 2018-09-21 MED ORDER — PANTOPRAZOLE SODIUM 40 MG PO TBEC
40.0000 mg | DELAYED_RELEASE_TABLET | Freq: Every day | ORAL | Status: DC
  Administered 2018-09-22: 40 mg via ORAL
  Filled 2018-09-21: qty 1

## 2018-09-21 MED ORDER — ONDANSETRON HCL 4 MG/2ML IJ SOLN: 4.0000 mg | Freq: Four times a day (QID) | INTRAMUSCULAR | Status: DC | PRN

## 2018-09-21 MED ORDER — SODIUM CHLORIDE 0.9 % IV SOLN
INTRAVENOUS | Status: DC
  Administered 2018-09-21: 23:00:00 via INTRAVENOUS

## 2018-09-21 NOTE — ED Notes (Signed)
Patient transported to X-ray 

## 2018-09-21 NOTE — ED Notes (Signed)
Placed pt on Purewick. Will collect urine sample when pt voids.  

## 2018-09-21 NOTE — ED Notes (Signed)
Bed: [facility] Expected date:  Expected time:  Means of arrival:  Comments: EMS-hypotension-room 17

## 2018-09-21 NOTE — ED Notes (Signed)
Family at bedside. 

## 2018-09-21 NOTE — ED Provider Notes (Signed)
[facility]-EMERGENCY DEPT Provider Note   CSN: [id number] Arrival date & time: 09/21/18  1158     History   Chief Complaint Chief Complaint  Patient presents with  . Hypotension    HPI Sarah Miranda is a 73 y.o. female.  Patient sent in from the doctor's office.  Apparently was hypotensive their blood pressure was 66/40.  EMS noted that the pressures today got was 100/60.  In route patient started to get a little confused.  May have been some slurred speech.  But states patient was still responding to questioning arousable to voice.  Upon arrival here without a blood pressure of 111 systolic the lowest we ever got was 91.  Patient seemed to become more alert.  Patient without any specific complaints.  However patient did seem to be a little confused.     Past Medical History:  Diagnosis Date  . Abnormal involuntary movements(781.0)   . Alopecia, unspecified   . Anemia, unspecified   . Anxiety   . Bipolar disorder, unspecified (HCC)   . Depression   . Dysuria   . Edema   . Encounter for long-term (current) use of other medications   . Esophageal reflux   . Fibromyalgia   . Hyperthyroidism   . Insomnia   . Lumbago   . Nonspecific elevation of levels of transaminase or lactic acid dehydrogenase (LDH)   . Other abnormal blood chemistry    hyperglycemia  . Other malaise and fatigue   . Pathologic fracture of vertebrae   . Reflux esophagitis   . Syncope and collapse   . Tachycardia, unspecified   . Unspecified essential hypertension   . Unspecified hypothyroidism     Patient Active Problem List   Diagnosis Date Noted  . Acute back pain 04/18/2018  . Iron deficiency anemia   . Gastroesophageal reflux disease with esophagitis   . Hematochezia 02/24/2018  . Compression fracture of T7 vertebra (HCC) 02/24/2018  . Tachycardia 02/24/2018  . Uncomplicated opioid dependence (HCC) 09/25/2017  . Symptomatic anemia 05/20/2017  . Nausea without  vomiting 06/23/2016  . Corn 06/23/2016  . Callus 06/02/2014  . Hyponatremia 01/22/2014  . Depression   . Insomnia   . Fibromyalgia   . Anxiety   . Hypothyroidism   . Hyperglycemia   . Malaise   . Bipolar disorder (HCC)   . Edema   . Essential hypertension     Past Surgical History:  Procedure Laterality Date  . BIOPSY  02/25/2018   Procedure: BIOPSY;  Surgeon: Meryl DareStark, Malcolm T, MD;  [facility];  Service: Endoscopy;;  . CHOLECYSTECTOMY  10/2010   complicated by bile leak  . COLONOSCOPY WITH PROPOFOL N/A 02/25/2018   Procedure: COLONOSCOPY WITH PROPOFOL;  Surgeon: Meryl DareStark, Malcolm T, MD;  [facility];  Service: Endoscopy;  Laterality: N/A;  . ERCP  11/03/2010   for bile leak  . ESOPHAGOGASTRODUODENOSCOPY (EGD) WITH PROPOFOL N/A 02/25/2018   Procedure: ESOPHAGOGASTRODUODENOSCOPY (EGD) WITH PROPOFOL;  Surgeon: Meryl DareStark, Malcolm T, MD;  [facility];  Service: Endoscopy;  Laterality: N/A;  . LAPAROSCOPIC TUBAL LIGATION    . TONSILLECTOMY       OB History   No obstetric history on file.      Home Medications    Prior to Admission medications   Medication Sig Start Date End Date Taking? Authorizing Provider  amitriptyline (ELAVIL) 25 MG tablet TAKE 1 TABLET(25 MG) BY MOUTH AT BEDTIME Patient taking differently: Take 25 mg by mouth at  bedtime.  01/31/18  Yes Reed, Tiffany L, DO  FLUoxetine (PROZAC) 40 MG capsule TAKE 1 CAPSULE BY MOUTH DAILY TO HELP NERVES AND DEPRESSION Patient taking differently: Take 40 mg by mouth daily.  01/12/18  Yes Reed, Tiffany L, DO  levothyroxine (SYNTHROID, LEVOTHROID) 25 MCG tablet TAKE 1 TABLET BY MOUTH EVERY DAY 01/05/18  Yes Reed, Tiffany L, DO  Multiple Vitamin (MULTIVITAMIN WITH MINERALS) TABS tablet Take 1 tablet by mouth daily.   Yes [provider]  ondansetron (ZOFRAN-ODT) 4 MG disintegrating tablet Take 4 mg by mouth every 8 (eight) hours as needed. 07/17/18  Yes [provider]   oxyCODONE-acetaminophen (PERCOCET/ROXICET) 5-325 MG tablet Take 1 tablet by mouth every 4 (four) hours as needed for moderate pain or severe pain. 04/22/18  Yes Rolly Salter, MD  pantoprazole (PROTONIX) 40 MG tablet Take 1 tablet (40 mg total) by mouth daily. 02/26/18  Yes Kathlen Mody, MD  pregabalin (LYRICA) 150 MG capsule Take 1 capsule (150 mg total) by mouth daily. 01/19/18  Yes Reed, Tiffany L, DO  risperiDONE (RISPERDAL) 1 MG tablet TAKE 1 TABLET BY MOUTH TWICE DAILY TO HELP RELAX Patient taking differently: Take 1 mg by mouth at bedtime.  06/13/17  Yes Reed, Tiffany L, DO  acetaminophen (TYLENOL) 325 MG tablet Take 2 tablets (650 mg total) by mouth every 6 (six) hours as needed for mild pain (or Fever >/= 101). Patient not taking: Reported on 09/21/2018 04/22/18   Rolly Salter, MD  docusate sodium (COLACE) 100 MG capsule Take 1 capsule (100 mg total) by mouth 2 (two) times daily. Patient not taking: Reported on 09/21/2018 04/22/18   Rolly Salter, MD  ondansetron (ZOFRAN) 4 MG tablet TAKE 1 TABLET BY MOUTH EVERY 8 HOURS AS NEEDED FOR NAUSEA Patient not taking: Reported on 09/21/2018 09/12/17   Kermit Balo, DO  senna (SENOKOT) 8.6 MG TABS tablet Take 1 tablet (8.6 mg total) by mouth daily as needed for mild constipation. Patient not taking: Reported on 09/21/2018 05/19/17   Kermit Balo, DO    Family History Family History  Problem Relation Age of Onset  . Cancer Father        prostate  . Colon cancer Neg Hx     Social History Social History   Tobacco Use  . Smoking status: Never Smoker  . Smokeless tobacco: Never Used  Substance Use Topics  . Alcohol use: No    Alcohol/week: 0.0 standard drinks  . Drug use: No     Allergies   Fetzima [levomilnacipran]   Review of Systems Review of Systems  Unable to perform ROS: Mental status change     Physical Exam Updated Vital Signs BP 114/75   Pulse 78   Temp 99.3 F (37.4 C) (Rectal)   Resp 15   Ht 1.626 m (5'  4")   Wt 68.5 kg   SpO2 99%   BMI 25.92 kg/m   Physical Exam Vitals signs and nursing note reviewed.  Constitutional:      General: She is not in acute distress.    Appearance: She is well-developed.  HENT:     Head: Normocephalic and atraumatic.     Mouth/Throat:     Mouth: Mucous membranes are dry.  Eyes:     Conjunctiva/sclera: Conjunctivae normal.  Neck:     Musculoskeletal: Normal range of motion and neck supple. No neck rigidity.  Cardiovascular:     Rate and Rhythm: Normal rate and regular rhythm.  Heart sounds: No murmur.  Pulmonary:     Effort: Pulmonary effort is normal. No respiratory distress.     Breath sounds: Normal breath sounds.  Chest:     Chest wall: No tenderness.  Abdominal:     Palpations: Abdomen is soft.     Tenderness: There is no abdominal tenderness.  Musculoskeletal:        General: No swelling.  Skin:    General: Skin is warm and dry.     Capillary Refill: Capillary refill takes less than 2 seconds.  Neurological:     General: No focal deficit present.     Mental Status: She is alert.     Comments: Alert but does seem to have some component of confusion.      ED Treatments / Results  Labs (all labs ordered are listed, but only abnormal results are displayed) Labs Reviewed  COMPREHENSIVE METABOLIC PANEL - Abnormal; Notable for the following components:      Result Value   Sodium 122 (*)    Chloride 88 (*)    Glucose, Bld 105 (*)    BUN 31 (*)    Creatinine, Ser 2.57 (*)    Calcium 8.3 (*)    Total Protein 6.4 (*)    GFR calc non Af Amer 18 (*)    GFR calc Af Amer 21 (*)    All other components within normal limits  CBC WITH DIFFERENTIAL/PLATELET - Abnormal; Notable for the following components:   WBC 11.5 (*)    All other components within normal limits  CULTURE, BLOOD (ROUTINE X 2)  CULTURE, BLOOD (ROUTINE X 2)  PROTIME-INR  URINALYSIS, ROUTINE W REFLEX MICROSCOPIC  BRAIN NATRIURETIC PEPTIDE  I-STAT CG4 LACTIC ACID, ED   I-STAT TROPONIN, ED  I-STAT CG4 LACTIC ACID, ED    EKG EKG Interpretation  Date/Time:  Thursday September 21 2018 12:24:23 EST Ventricular Rate:  87 PR Interval:    QRS Duration: 142 QT Interval:  409 QTC Calculation: 493 R Axis:   -35 Text Interpretation:  Sinus rhythm Consider left atrial enlargement Left bundle branch block No significant change since last tracing Confirmed by Vanetta MuldersZackowski, Lilyanna Lunt [phone]) on 09/21/2018 1:03:37 PM   Radiology Dg Chest 2 View  Result Date: 09/21/2018 CLINICAL DATA:  Hypotension.  Possible sepsis EXAM: CHEST - 2 VIEW COMPARISON:  February 05, 2018 FINDINGS: The heart size and mediastinal contours are stable. A large hernia is identified in the left hemithorax. The lung volumes are low. There is central pulmonary vascular congestion. No pleural effusion or focal pneumonia is identified. The visualized skeletal structures are stable. IMPRESSION: Low lung volumes with mild central pulmonary vascular congestion. Large hernia in the left hemithorax. Electronically Signed   By: Sherian ReinWei-Chen  Lin M.D.   On: 09/21/2018 13:03    Procedures Procedures (including critical care time)  Medications Ordered in ED Medications  0.9 %  sodium chloride infusion ( Intravenous New Bag/Given 09/21/18 1451)  sodium chloride 0.9 % bolus 1,000 mL (1,000 mLs Intravenous New Bag/Given 09/21/18 1453)  sodium chloride flush (NS) 0.9 % injection 3 mL (3 mLs Intravenous Given 09/21/18 1314)  sodium chloride flush (NS) 0.9 % injection 3 mL (3 mLs Intravenous Given 09/21/18 1315)     Initial Impression / Assessment and Plan / ED Course  I have reviewed the triage vital signs and the nursing notes.  Pertinent labs & imaging results that were available during my care of the patient were reviewed by me and considered in my medical  decision making (see chart for details).    Patient brought in by EMS from doctor's office.  Apparently had low blood pressure there was reported to be 66/40.  EMS  reported the blood pressure they got was 100/60 in route.  Also upon arrival patient seemed to become disoriented.  So they raise some concern about mental status change.  Upon arrival here patient following commands.  Arousable to voice.  Shortly after arrival patient became much more alert.  And remained so.  Work-up significant for evidence of acute kidney injury seems to be mainly prerenal.  Blood pressures were soft with the lowest we had was systolic and 91.  Patient when chest x-ray was negative received a liter of fluid and blood pressures came up to systolics in the 130s.  Patient also became more alert.  Labs significant also for significant hyponatremia with a sodium of 122.  Do not feel patient needs CT.  Patient needs admission for IV fluids and correction of the hyponatremia.  Hospitalist will admit.   Final Clinical Impressions(s) / ED Diagnoses   Final diagnoses:  AKI (acute kidney injury) (HCC)  Hyponatremia    ED Discharge Orders    None       Vanetta Mulders, MD 09/21/18 1730

## 2018-09-21 NOTE — ED Notes (Signed)
Attempted report x1. 

## 2018-09-21 NOTE — ED Notes (Signed)
URINE CULTURE SENT WITH URINE SAMPLE.  

## 2018-09-21 NOTE — ED Notes (Signed)
Called to give report. Nurse will call back 

## 2018-09-21 NOTE — H&P (Signed)
History and Physical  Sarah Miranda:[id number] DOB: 1946/05/09 DOA: 09/21/2018  PCP: Rodrigo Ran, MD Patient coming from: Home  I have personally briefly reviewed patient's old medical records in [facility]   Chief Complaint: Hypotension, altered mental status.  HPI: Sarah Miranda is a 73 y.o. female with past medical history significant for depression, anxiety, hypertension, hypothyroidism who was referred from her primary care doctor for further evaluation and treatment of hypotension and acute renal failure. Patient reports that she has not been feeling well for the last week, she has not been able to eat and she has been feeling nauseated.  She report productive cough for the last several days associated with nausea.  She denies chest pain.  She does report some shortness of breath on exertion. She went to see her primary care doctor for further evaluation of cough and nausea.  During evaluation patient was found to have worsening hyponatremia, hypotension with reported systolic blood pressure in the 60s.  She was referred to the ED for further evaluation.  During route, EMS gave patient IV fluids.  Her systolic blood pressure increased to 100.  Also patient was noted to be confused and lethargic.  Evaluation  in the ED; patient was found to have a blood pressure in the 100 range.  Sodium level 122 BUN 31, creatinine 2.5, lactic acid 1.1 troponin 0 0.01, white count 11.  Chest x-ray with low lung volume and pulmonary vascular congestion.  Large he had a hernia in the left hemithorax.  Husband report, that patient has been having poor oral intake for the last 2 to 3 months.  Worse over the last week. Review of Systems: All systems reviewed and apart from history of presenting illness, are negative.  Past Medical History:  Diagnosis Date  . Abnormal involuntary movements(781.0)   . Alopecia, unspecified   . Anemia, unspecified   . Anxiety   . Bipolar disorder,  unspecified (HCC)   . Depression   . Dysuria   . Edema   . Encounter for long-term (current) use of other medications   . Esophageal reflux   . Fibromyalgia   . Hyperthyroidism   . Insomnia   . Lumbago   . Nonspecific elevation of levels of transaminase or lactic acid dehydrogenase (LDH)   . Other abnormal blood chemistry    hyperglycemia  . Other malaise and fatigue   . Pathologic fracture of vertebrae   . Reflux esophagitis   . Syncope and collapse   . Tachycardia, unspecified   . Unspecified essential hypertension   . Unspecified hypothyroidism    Past Surgical History:  Procedure Laterality Date  . BIOPSY  02/25/2018   Procedure: BIOPSY;  Surgeon: Meryl Dare, MD;  Location: [facility] ENDOSCOPY;  Service: Endoscopy;;  . CHOLECYSTECTOMY  10/2010   complicated by bile leak  . COLONOSCOPY WITH PROPOFOL N/A 02/25/2018   Procedure: COLONOSCOPY WITH PROPOFOL;  Surgeon: Meryl Dare, MD;  Location: [facility] ENDOSCOPY;  Service: Endoscopy;  Laterality: N/A;  . ERCP  11/03/2010   for bile leak  . ESOPHAGOGASTRODUODENOSCOPY (EGD) WITH PROPOFOL N/A 02/25/2018   Procedure: ESOPHAGOGASTRODUODENOSCOPY (EGD) WITH PROPOFOL;  Surgeon: Meryl Dare, MD;  Location: [facility] ENDOSCOPY;  Service: Endoscopy;  Laterality: N/A;  . LAPAROSCOPIC TUBAL LIGATION    . TONSILLECTOMY     Social History:  reports that she has never smoked. She has never used smokeless tobacco. She reports that she does not drink alcohol or use drugs.  Allergies  Allergen Reactions  . Fetzima [Levomilnacipran] Nausea Only    Family History  Problem Relation Age of Onset  . Cancer Father        prostate  . Colon cancer Neg Hx     Prior to Admission medications   Medication Sig Start Date End Date Taking? Authorizing Provider  amitriptyline (ELAVIL) 25 MG tablet TAKE 1 TABLET(25 MG) BY MOUTH AT BEDTIME Patient taking differently: Take 25 mg by mouth at bedtime.  01/31/18  Yes Reed, Tiffany L, DO  FLUoxetine (PROZAC) 40  MG capsule TAKE 1 CAPSULE BY MOUTH DAILY TO HELP NERVES AND DEPRESSION Patient taking differently: Take 40 mg by mouth daily.  01/12/18  Yes Reed, Tiffany L, DO  levothyroxine (SYNTHROID, LEVOTHROID) 25 MCG tablet TAKE 1 TABLET BY MOUTH EVERY DAY 01/05/18  Yes Reed, Tiffany L, DO  Multiple Vitamin (MULTIVITAMIN WITH MINERALS) TABS tablet Take 1 tablet by mouth daily.   Yes [provider]  ondansetron (ZOFRAN-ODT) 4 MG disintegrating tablet Take 4 mg by mouth every 8 (eight) hours as needed. 07/17/18  Yes [provider]  oxyCODONE-acetaminophen (PERCOCET/ROXICET) 5-325 MG tablet Take 1 tablet by mouth every 4 (four) hours as needed for moderate pain or severe pain. 04/22/18  Yes Rolly SalterPatel, Pranav M, MD  pantoprazole (PROTONIX) 40 MG tablet Take 1 tablet (40 mg total) by mouth daily. 02/26/18  Yes Kathlen ModyAkula, Vijaya, MD  pregabalin (LYRICA) 150 MG capsule Take 1 capsule (150 mg total) by mouth daily. 01/19/18  Yes Reed, Tiffany L, DO  risperiDONE (RISPERDAL) 1 MG tablet TAKE 1 TABLET BY MOUTH TWICE DAILY TO HELP RELAX Patient taking differently: Take 1 mg by mouth at bedtime.  06/13/17  Yes Reed, Tiffany L, DO  acetaminophen (TYLENOL) 325 MG tablet Take 2 tablets (650 mg total) by mouth every 6 (six) hours as needed for mild pain (or Fever >/= 101). Patient not taking: Reported on 09/21/2018 04/22/18   Rolly SalterPatel, Pranav M, MD  docusate sodium (COLACE) 100 MG capsule Take 1 capsule (100 mg total) by mouth 2 (two) times daily. Patient not taking: Reported on 09/21/2018 04/22/18   Rolly SalterPatel, Pranav M, MD  ondansetron (ZOFRAN) 4 MG tablet TAKE 1 TABLET BY MOUTH EVERY 8 HOURS AS NEEDED FOR NAUSEA Patient not taking: Reported on 09/21/2018 09/12/17   Kermit Baloeed, Tiffany L, DO  senna (SENOKOT) 8.6 MG TABS tablet Take 1 tablet (8.6 mg total) by mouth daily as needed for mild constipation. Patient not taking: Reported on 09/21/2018 05/19/17   Kermit Baloeed, Tiffany L, DO   Physical Exam: Vitals:   09/21/18 1430 09/21/18 1500 09/21/18  1544 09/21/18 1625  BP: 91/66 114/75 132/77 134/68  Pulse: 77 78 78 74  Resp: 15 15 16 19   Temp:      TempSrc:      SpO2: 100% 99% 99% 100%  Weight:      Height:         General exam: Moderately built and nourished patient, lying comfortably supine on the gurney in no obvious distress.  Head, eyes and ENT: Nontraumatic and normocephalic. Pupils equally reacting to light and accommodation. Oral mucosa moist.  Neck: Supple. No JVD, carotid bruit or thyromegaly.  Lymphatics: No lymphadenopathy.  Respiratory system: Clear to auscultation. No increased work of breathing.  Cardiovascular system: S1 and S2 heard, RRR. No JVD, murmurs, gallops, clicks or pedal edema.  Gastrointestinal system: Abdomen is nondistended, soft and nontender. Normal bowel sounds heard. No organomegaly or masses appreciated.  Central nervous system:  oriented. No focal neurological deficits.  Sleepy  Extremities: Symmetric 5 x 5 power. Peripheral pulses symmetrically felt.   Skin: No rashes or acute findings.  Musculoskeletal system: Negative exam.  Psychiatry: Pleasant and cooperative.   Labs on Admission:  Basic Metabolic Panel: Recent Labs  Lab 09/21/18 1236  NA 122*  K 4.0  CL 88*  CO2 22  GLUCOSE 105*  BUN 31*  CREATININE 2.57*  CALCIUM 8.3*   Liver Function Tests: Recent Labs  Lab 09/21/18 1236  AST 15  ALT 13  ALKPHOS 54  BILITOT 0.7  PROT 6.4*  ALBUMIN 3.7   No results for input(s): LIPASE, AMYLASE in the last 168 hours. No results for input(s): AMMONIA in the last 168 hours. CBC: Recent Labs  Lab 09/21/18 1236  WBC 11.5*  NEUTROABS 7.7  HGB 12.2  HCT 38.9  MCV 91.5  PLT 242   Cardiac Enzymes: No results for input(s): CKTOTAL, CKMB, CKMBINDEX, TROPONINI in the last 168 hours.  BNP (last 3 results) No results for input(s): PROBNP in the last 8760 hours. CBG: No results for input(s): GLUCAP in the last 168 hours.  Radiological Exams on Admission: Dg Chest 2  View  Result Date: 09/21/2018 CLINICAL DATA:  Hypotension.  Possible sepsis EXAM: CHEST - 2 VIEW COMPARISON:  February 05, 2018 FINDINGS: The heart size and mediastinal contours are stable. A large hernia is identified in the left hemithorax. The lung volumes are low. There is central pulmonary vascular congestion. No pleural effusion or focal pneumonia is identified. The visualized skeletal structures are stable. IMPRESSION: Low lung volumes with mild central pulmonary vascular congestion. Large hernia in the left hemithorax. Electronically Signed   By: Sherian Rein M.D.   On: 09/21/2018 13:03    EKG: Independently reviewed.  Old left bundle branch block  Assessment/Plan Active Problems:   Depression   Fibromyalgia   Hypothyroidism   Hyponatremia   AKI (acute kidney injury) (HCC)   Hypotension  1-Hypotension;  This could have be related to hypovolemia, in the setting of dehydration, in the setting of lisinopril. Blood pressure improved after IV fluids. Lactic acid was normal. We will monitor for fever, will need to start IV antibiotics if patient spikes fever. Chest x-ray negative for infection.  UA need to be obtained.  2-AKI; patient present with creatinine at 2.5. Likely in the setting of hypovolemia, poor oral intake, and ace inhibitor use. We will hold lisinopril. Continue with IV fluids. She has a prior history of urinary retention, I will get a renal ultrasound. Check bladder scan. Continue with IV fluids.  3-Cough;  Will check for the flu.  We will also repeat chest x-ray tomorrow after hydration to rule out pneumonia.  4-Dyspnea; exertion; will check echo.  5-Hyponatremia; Per records sodium 128 in the past. Suspect this is related to dehydration. Continue with IV fluids, repeat sodium level in the morning.  6-Acute metabolic encephalopathy; Could be related to hypotension, hypovolemia. Hyponatremia.  Need to be careful with opioids. I will hold Lyrica, Prozac. We  will continue with Elavil at bedtime. We will order risperidone as a as needed for bedtime.    DVT Prophylaxis: Heparin Code Status: Full code Family Communication: Care discussed with husband who was at bedside Disposition Plan: Admit under observation for IV fluid on visit evaluation of hypotension and cough.  Time spent; 75 minutes Ireanna Finlayson A Dayle Sherpa MD [facility]   If 7PM-7AM, please contact night-coverage [email or web address] Password [facility]  09/21/2018, 4:28 PM

## 2018-09-21 NOTE — ED Notes (Signed)
ED TO INPATIENT HANDOFF REPORT  Name/Age/Gender Sarah Miranda 73 y.o. female  Code Status    Code Status Orders  (From admission, onward)         Start     Ordered   09/21/18 1822  Full code  Continuous     09/21/18 1821        Code Status History    Date Active Date Inactive Code Status Order ID Comments User Context   04/18/2018 1141 04/22/2018 1615 Full Code [id number]  Onnie BoerEmokpae, Ejiroghene E, MD ED   02/24/2018 1521 02/25/2018 1817 Full Code [id number]  Gwenyth BenderBlack, Karen M, NP ED   05/20/2017 1519 05/21/2017 1333 Full Code [id number]  Leatha GildingGherghe, Costin M, MD Inpatient    Advance Directive Documentation     Most Recent Value  Type of Advance Directive  Living will  Pre-existing out of facility DNR order (yellow form or pink MOST form)  -  "MOST" Form in Place?  -      Home/SNF/Other Home  Chief Complaint hypotension   Level of Care/Admitting Diagnosis ED Disposition    ED Disposition Condition Comment   Admit  [facility] [[id number]]  Level of Care: Telemetry [5]  Admit to tele based on following criteria: Monitor QTC interval  Diagnosis: AKI (acute kidney injury) [facility](HCC) [[id number]]) [[id number]]  Admitting Physician: Alba CoryEGALADO, BELKYS A [id number]]  Attending Physician: Hartley BarefootEGALADO, BELKYS A [[id number]]  PT Class (Do Not Modify): Observation [104]  PT Acc Code (Do Not Modify): Observation [10022]       Medical History Past Medical History:  Diagnosis Date  . Abnormal involuntary movements(781.0)   . Alopecia, unspecified   . Anemia, unspecified   . Anxiety   . Bipolar disorder, unspecified (HCC)   . Depression   . Dysuria   . Edema   . Encounter for long-term (current) use of other medications   . Esophageal reflux   . Fibromyalgia   . Hyperthyroidism   . Insomnia   . Lumbago   . Nonspecific elevation of levels of transaminase or lactic acid dehydrogenase (LDH)   . Other abnormal blood chemistry    hyperglycemia  . Other malaise and fatigue   .  Pathologic fracture of vertebrae   . Reflux esophagitis   . Syncope and collapse   . Tachycardia, unspecified   . Unspecified essential hypertension   . Unspecified hypothyroidism     Allergies Allergies  Allergen Reactions  . Fetzima [Levomilnacipran] Nausea Only    IV Location/Drains/Wounds Patient Lines/Drains/Airways Status   Active Line/Drains/Airways    Name:   Placement date:   Placement time:   Site:   Days:   Peripheral IV 09/21/18 Right Antecubital   09/21/18    1228    Antecubital   less than 1          Labs/Imaging Results for orders placed or performed during the hospital encounter of 09/21/18 (from the past 48 hour(s))  I-stat troponin, ED     Status: None   Collection Time: 09/21/18 12:34 PM  Result Value Ref Range   Troponin i, poc 0.01 0.00 - 0.08 ng/mL   Comment 3            Comment: Due to the release kinetics of cTnI, a negative result within the first hours of the onset of symptoms does not rule out myocardial infarction with certainty. If myocardial infarction is still suspected, repeat the test at appropriate intervals.   I-Stat CG4 Lactic Acid, ED  Status: None   Collection Time: 09/21/18 12:35 PM  Result Value Ref Range   Lactic Acid, Venous 1.11 0.5 - 1.9 mmol/L  Comprehensive metabolic panel     Status: Abnormal   Collection Time: 09/21/18 12:36 PM  Result Value Ref Range   Sodium 122 (L) 135 - 145 mmol/L   Potassium 4.0 3.5 - 5.1 mmol/L   Chloride 88 (L) 98 - 111 mmol/L   CO2 22 22 - 32 mmol/L   Glucose, Bld 105 (H) 70 - 99 mg/dL   BUN 31 (H) 8 - 23 mg/dL   Creatinine, Ser 4.092.57 (H) 0.44 - 1.00 mg/dL   Calcium 8.3 (L) 8.9 - 10.3 mg/dL   Total Protein 6.4 (L) 6.5 - 8.1 g/dL   Albumin 3.7 3.5 - 5.0 g/dL   AST 15 15 - 41 U/L   ALT 13 0 - 44 U/L   Alkaline Phosphatase 54 38 - 126 U/L   Total Bilirubin 0.7 0.3 - 1.2 mg/dL   GFR calc non Af Amer 18 (L) >60 mL/min   GFR calc Af Amer 21 (L) >60 mL/min   Anion gap 12 5 - 15     Comment: Performed at [facility], [address] [place], [place] [address]  CBC with Differential     Status: Abnormal   Collection Time: 09/21/18 12:36 PM  Result Value Ref Range   WBC 11.5 (H) 4.0 - 10.5 K/uL   RBC 4.25 3.87 - 5.11 MIL/uL   Hemoglobin 12.2 12.0 - 15.0 g/dL   HCT 47.838.9 29.536.0 - 62.146.0 %   MCV 91.5 80.0 - 100.0 fL   MCH 28.7 26.0 - 34.0 pg   MCHC 31.4 30.0 - 36.0 g/dL   RDW 30.814.4 65.711.5 - 84.615.5 %   Platelets 242 150 - 400 K/uL   nRBC 0.0 0.0 - 0.2 %   Neutrophils Relative % 67 %   Neutro Abs 7.7 1.7 - 7.7 K/uL   Lymphocytes Relative 23 %   Lymphs Abs 2.6 0.7 - 4.0 K/uL   Monocytes Relative 7 %   Monocytes Absolute 0.8 0.1 - 1.0 K/uL   Eosinophils Relative 3 %   Eosinophils Absolute 0.3 0.0 - 0.5 K/uL   Basophils Relative 0 %   Basophils Absolute 0.0 0.0 - 0.1 K/uL   Immature Granulocytes 0 %   Abs Immature Granulocytes 0.05 0.00 - 0.07 K/uL    Comment: Performed at [facility], [address] [place], [place] [address]  Protime-INR     Status: None   Collection Time: 09/21/18 12:36 PM  Result Value Ref Range   Prothrombin Time 12.7 11.4 - 15.2 seconds   INR 0.96     Comment: Performed at [facility], [address] [place], [place] [address]  Brain natriuretic peptide     Status: None   Collection Time: 09/21/18  3:49 PM  Result Value Ref Range   B Natriuretic Peptide 34.3 0.0 - 100.0 pg/mL    Comment: Performed at [facility], [address] [place], [place] [address]  Urinalysis, Routine w reflex microscopic     Status: None   Collection Time: 09/21/18  4:40 PM  Result Value Ref Range   Color, Urine YELLOW YELLOW   APPearance CLEAR CLEAR   Specific Gravity, Urine 1.008 1.005 - 1.030   pH 5.0 5.0 - 8.0   Glucose, UA NEGATIVE NEGATIVE mg/dL   Hgb urine dipstick NEGATIVE NEGATIVE   Bilirubin Urine  NEGATIVE NEGATIVE   Ketones, ur NEGATIVE NEGATIVE mg/dL   Protein, ur NEGATIVE  NEGATIVE mg/dL   Nitrite NEGATIVE NEGATIVE   Leukocytes, UA NEGATIVE NEGATIVE    Comment: Performed at [facility], [address] [place], [place] [address]  Influenza panel by PCR (type A & B)     Status: None   Collection Time: 09/21/18  5:42 PM  Result Value Ref Range   Influenza A By PCR NEGATIVE NEGATIVE   Influenza B By PCR NEGATIVE NEGATIVE    Comment: (NOTE) The Xpert Xpress Flu assay is intended as an aid in the diagnosis of  influenza and should not be used as a sole basis for treatment.  This  assay is FDA approved for nasopharyngeal swab specimens only. Nasal  washings and aspirates are unacceptable for Xpert Xpress Flu testing. Performed at [facility], [address] [place], [place] [address]    Dg Chest 2 View  Result Date: 09/21/2018 CLINICAL DATA:  Hypotension.  Possible sepsis EXAM: CHEST - 2 VIEW COMPARISON:  February 05, 2018 FINDINGS: The heart size and mediastinal contours are stable. A large hernia is identified in the left hemithorax. The lung volumes are low. There is central pulmonary vascular congestion. No pleural effusion or focal pneumonia is identified. The visualized skeletal structures are stable. IMPRESSION: Low lung volumes with mild central pulmonary vascular congestion. Large hernia in the left hemithorax. Electronically Signed   By: Sherian Rein M.D.   On: 09/21/2018 13:03   US Renal  Result Date: 09/21/2018 CLINICAL DATA:  Acute kidney injury EXAM: RENAL / URINARY TRACT ULTRASOUND COMPLETE COMPARISON:  CT abdomen and pelvis 02/24/2018 FINDINGS: Right Kidney: Renal measurements: 10.2 x 3.6 x 3.8 cm = volume: 73.7 mL. Normal cortical thickness. Upper normal cortical echogenicity. Small cyst at inferior pole 1.6 x 1.7 x 1.6 cm, unchanged. Additional tiny cyst at upper pole 8 x 8 x 8 mm, stable. No additional mass or hydronephrosis. No shadowing calcifications. Left Kidney: Renal measurements: 9.9 x 4.7 x 4.2 cm =  volume: 103.5 mL. Normal cortical thickness. Upper normal cortical echogenicity. Cyst at inferior pole 5.0 x 4.5 x 4.8 cm, simple features, unchanged from prior CT. No additional mass, hydronephrosis or shadowing calcification. Bladder: Contains minimal urine, poorly assessed. IMPRESSION: BILATERAL renal cysts. No evidence of hydronephrosis. Electronically Signed   By: Ulyses Southward M.D.   On: 09/21/2018 17:45   EKG Interpretation  Date/Time:  Thursday September 21 2018 12:24:23 EST Ventricular Rate:  87 PR Interval:    QRS Duration: 142 QT Interval:  409 QTC Calculation: 493 R Axis:   -35 Text Interpretation:  Sinus rhythm Consider left atrial enlargement Left bundle branch block No significant change since last tracing Confirmed by Vanetta Mulders [phone]) on 09/21/2018 1:03:37 PM   Pending Labs Unresulted Labs (From admission, onward)    Start     Ordered   09/21/18 1617  Expectorated sputum assessment w rflx to resp cult  Once,   R     09/21/18 1616   09/21/18 1616  Vitamin B12  Once,   R     09/21/18 1615   09/21/18 1224  Culture, blood (Routine x 2)  BLOOD CULTURE X 2,   STAT     09/21/18 1224   Signed and Held  Basic metabolic panel  Tomorrow morning,   R     Signed and Held   Signed and Held  CBC  Tomorrow morning,   R  Signed and Held          Vitals/Pain Today's Vitals   09/21/18 1625 09/21/18 1657 09/21/18 1800 09/21/18 1830  BP: 134/68 (!) 147/74 127/70 128/67  Pulse: 74 65  91  Resp: 19 16 17 16   Temp:      TempSrc:      SpO2: 100% 99%  97%  Weight:      Height:      PainSc:        Isolation Precautions Droplet precaution  Medications Medications  0.9 %  sodium chloride infusion ( Intravenous New Bag/Given 09/21/18 1451)  oxyCODONE-acetaminophen (PERCOCET/ROXICET) 5-325 MG per tablet 1 tablet (has no administration in time range)  risperiDONE (RISPERDAL) tablet 1 mg (has no administration in time range)  ondansetron (ZOFRAN) tablet 4 mg (has no  administration in time range)    Or  ondansetron (ZOFRAN) injection 4 mg (has no administration in time range)  sodium chloride flush (NS) 0.9 % injection 3 mL (3 mLs Intravenous Given 09/21/18 1314)  sodium chloride flush (NS) 0.9 % injection 3 mL (3 mLs Intravenous Given 09/21/18 1315)  sodium chloride 0.9 % bolus 1,000 mL (0 mLs Intravenous Stopped 09/21/18 1731)    Mobility walks with device

## 2018-09-21 NOTE — ED Triage Notes (Signed)
Pt BIB EMS from doctors office, c/o hypotension.  PCP reported bp of 66/40 initially, EMS reported 100/60 en route.  EMS reported possible change in mental status during transportation to ED, pt became drowsy, slurred speech (inconsitent) still responding to questioning.  Pt rousable to voice.

## 2018-09-22 ENCOUNTER — Observation Stay (HOSPITAL_BASED_OUTPATIENT_CLINIC_OR_DEPARTMENT_OTHER): Payer: Medicare Other

## 2018-09-22 ENCOUNTER — Observation Stay (HOSPITAL_COMMUNITY): Payer: Medicare Other

## 2018-09-22 DIAGNOSIS — I351 Nonrheumatic aortic (valve) insufficiency: Secondary | ICD-10-CM | POA: Diagnosis not present

## 2018-09-22 DIAGNOSIS — N179 Acute kidney failure, unspecified: Secondary | ICD-10-CM | POA: Diagnosis not present

## 2018-09-22 LAB — CBC
HCT: 33.5 % — ABNORMAL LOW (ref 36.0–46.0)
Hemoglobin: 10.6 g/dL — ABNORMAL LOW (ref 12.0–15.0)
MCH: 29 pg (ref 26.0–34.0)
MCHC: 31.6 g/dL (ref 30.0–36.0)
MCV: 91.8 fL (ref 80.0–100.0)
Platelets: 200 10*3/uL (ref 150–400)
RBC: 3.65 MIL/uL — ABNORMAL LOW (ref 3.87–5.11)
RDW: 14.2 % (ref 11.5–15.5)
WBC: 5.5 10*3/uL (ref 4.0–10.5)
nRBC: 0 % (ref 0.0–0.2)

## 2018-09-22 LAB — ECHOCARDIOGRAM COMPLETE
Height: 64 in
Weight: 2460.8 oz

## 2018-09-22 LAB — BASIC METABOLIC PANEL
Anion gap: 7 (ref 5–15)
BUN: 30 mg/dL — ABNORMAL HIGH (ref 8–23)
CALCIUM: 8.3 mg/dL — AB (ref 8.9–10.3)
CO2: 23 mmol/L (ref 22–32)
Chloride: 99 mmol/L (ref 98–111)
Creatinine, Ser: 1.3 mg/dL — ABNORMAL HIGH (ref 0.44–1.00)
GFR calc Af Amer: 47 mL/min — ABNORMAL LOW (ref >60)
GFR calc non Af Amer: 41 mL/min — ABNORMAL LOW (ref >60)
Glucose, Bld: 111 mg/dL — ABNORMAL HIGH (ref 70–99)
Potassium: 4.9 mmol/L (ref 3.5–5.1)
Sodium: 129 mmol/L — ABNORMAL LOW (ref 135–145)

## 2018-09-22 MED ORDER — PANTOPRAZOLE SODIUM 40 MG PO TBEC: 40.0000 mg | DELAYED_RELEASE_TABLET | Freq: Two times a day (BID) | ORAL | 2 refills | Status: DC

## 2018-09-22 NOTE — Discharge Summary (Signed)
Physician Discharge Summary  Sarah Miranda ZOX:[id number] DOB: [birth year] DOA: 09/21/2018  PCP: Rodrigo RanPerini, Mark, MD  Admit date: 09/21/2018 Discharge date: 09/22/2018  Admitted From: Home  Disposition:  Home   Recommendations for Outpatient Follow-up:  1. Follow up with PCP in 1-2 weeks 2. Please obtain BMP/CBC in one week 3. Needs follow up with cardiology for mild to moderate aortic insuficiency. Mildly reduce Systolic function.  4. Encourage oral intake.  5. Needs further evaluation of hiatal hernia.    Home Health: yes  Discharge Condition: stable/  CODE STATUS: full code.  Diet recommendation: regular  Brief/Interim Summary: Brief/Interim Summary: Sarah BromeBrenda P Bairdis a 73 y.o.femalewith past medical history significant for depression, anxiety, hypertension, hypothyroidism who was referred from her primary care doctor for further evaluation and treatment of hypotension and acute renal failure. Patient reports that she has not been feeling well for the last week, she has not been able to eat and she has been feeling nauseated. She report productive cough for the last several days associated with nausea. She denies chest pain. She does report some shortness of breath on exertion. She went to see her primary care doctor for further evaluation of cough and nausea. During evaluation patient was found to have worsening hyponatremia, hypotension with reported systolic blood pressure in the 60s. She was referred to the ED for further evaluation.  During route,EMS gave patient IV fluids. Her systolic blood pressure increased to 100. Also patient was noted to be confused and lethargic.  Evaluationin the ED;patient was found to have a blood pressure in the 100 range. Sodium level 122 BUN 31, creatinine 2.5, lactic acid 1.1 troponin 0 0.01, white count 11.Chest x-ray with low lung volume and pulmonary vascular congestion. Large he had a hernia in the left hemithorax.  Husband  report, that patient has been having poor oral intake for the last 2 to 3 months. Worse over the last week.  ECHO; Normal LV size with mild LV hypertrophy. LV systolic low normal to mildly reduced, EF 50-55%. Septal-lateral dyssynchrony due to LBBB. Normal RV size and systolic function. Mild to moderate aortic insufficiency.  1-Hypotension; This could have be related to hypovolemia, in the setting of dehydration, in the setting of lisinopril. Blood pressure improved after IV fluids. Lactic acid was normal. Continue to hold lisinopril at discharge.  Chest x-ray negative for infection. UA need to be obtained.  2-AKI;patient present with creatinine at2.5. Likely in the setting of hypovolemia, poor oral intake, and ace inhibitor use. We will continue to  hold lisinopril at discharge. . Improved with IV fluids.  Cr has decrease to 1.3   3-Cough; Chest x ray negative for Infection.   4-Dyspnea;exertion; ECHO with aortic insufficiency and mildly reduce EF.  Needs referral to cardiology   5-Hyponatremia; Per records sodium 128 in the past. Suspect this is related to dehydration. Improved with IV fluids.  Back to baseline.   6-Acute metabolic encephalopathy; Could be related to hypotension, hypovolemia. Hyponatremia. Need to be careful with opioids. Resume  Lyrica, Prozac. We will continue with Elavil at bedtime. Back to baseline. Presume related to dehydration.   Discharge Diagnoses:  Active Problems:   Depression   Fibromyalgia   Hypothyroidism   Hyponatremia   AKI (acute kidney injury) (HCC)   Hypotension    Discharge Instructions  Discharge Instructions    Diet general   Complete by:  As directed    Increase activity slowly   Complete by:  As directed  Allergies as of 09/22/2018      Reactions   Fetzima [levomilnacipran] Nausea Only      Medication List    STOP taking these medications   acetaminophen 325 MG tablet Commonly known  as:  TYLENOL   docusate sodium 100 MG capsule Commonly known as:  COLACE   ondansetron 4 MG tablet Commonly known as:  ZOFRAN   senna 8.6 MG Tabs tablet Commonly known as:  SENOKOT     TAKE these medications   amitriptyline 25 MG tablet Commonly known as:  ELAVIL TAKE 1 TABLET(25 MG) BY MOUTH AT BEDTIME What changed:  See the new instructions.   FLUoxetine 40 MG capsule Commonly known as:  PROZAC TAKE 1 CAPSULE BY MOUTH DAILY TO HELP NERVES AND DEPRESSION What changed:  See the new instructions.   levothyroxine 25 MCG tablet Commonly known as:  SYNTHROID, LEVOTHROID TAKE 1 TABLET BY MOUTH EVERY DAY   multivitamin with minerals Tabs tablet Take 1 tablet by mouth daily.   ondansetron 4 MG disintegrating tablet Commonly known as:  ZOFRAN-ODT Take 4 mg by mouth every 8 (eight) hours as needed.   oxyCODONE-acetaminophen 5-325 MG tablet Commonly known as:  PERCOCET/ROXICET Take 1 tablet by mouth every 4 (four) hours as needed for moderate pain or severe pain.   pantoprazole 40 MG tablet Commonly known as:  PROTONIX Take 1 tablet (40 mg total) by mouth 2 (two) times daily. What changed:  when to take this   pregabalin 150 MG capsule Commonly known as:  LYRICA Take 1 capsule (150 mg total) by mouth daily.   risperiDONE 1 MG tablet Commonly known as:  RISPERDAL TAKE 1 TABLET BY MOUTH TWICE DAILY TO HELP RELAX What changed:  See the new instructions.       Allergies  Allergen Reactions  . Fetzima [Levomilnacipran] Nausea Only    Consultations: none Procedures/Studies: Dg Chest 2 View  Result Date: 09/22/2018 CLINICAL DATA:  Pt states that she had a cough before being admitted to the hospital for htn and acute renal failure. EXAM: CHEST - 2 VIEW COMPARISON:  09/21/2018 FINDINGS: Cardiac silhouette is borderline enlarged. Large hiatal hernia. No mediastinal or hilar masses. No evidence of adenopathy. Prominent bronchovascular markings. Lungs otherwise clear. No  pleural effusion or pneumothorax. Skeletal structures are demineralized. No acute skeletal abnormality. IMPRESSION: 1. No acute cardiopulmonary disease. 2. Large hiatal hernia. Electronically Signed   By: Amie Portlandavid  Ormond M.D.   On: 09/22/2018 11:47   Dg Chest 2 View  Result Date: 09/21/2018 CLINICAL DATA:  Hypotension.  Possible sepsis EXAM: CHEST - 2 VIEW COMPARISON:  February 05, 2018 FINDINGS: The heart size and mediastinal contours are stable. A large hernia is identified in the left hemithorax. The lung volumes are low. There is central pulmonary vascular congestion. No pleural effusion or focal pneumonia is identified. The visualized skeletal structures are stable. IMPRESSION: Low lung volumes with mild central pulmonary vascular congestion. Large hernia in the left hemithorax. Electronically Signed   By: Sherian ReinWei-Chen  Lin M.D.   On: 09/21/2018 13:03   [place] Renal  Result Date: 09/21/2018 CLINICAL DATA:  Acute kidney injury EXAM: RENAL / URINARY TRACT ULTRASOUND COMPLETE COMPARISON:  CT abdomen and pelvis 02/24/2018 FINDINGS: Right Kidney: Renal measurements: 10.2 x 3.6 x 3.8 cm = volume: 73.7 mL. Normal cortical thickness. Upper normal cortical echogenicity. Small cyst at inferior pole 1.6 x 1.7 x 1.6 cm, unchanged. Additional tiny cyst at upper pole 8 x 8 x 8 mm, stable. No additional mass  or hydronephrosis. No shadowing calcifications. Left Kidney: Renal measurements: 9.9 x 4.7 x 4.2 cm = volume: 103.5 mL. Normal cortical thickness. Upper normal cortical echogenicity. Cyst at inferior pole 5.0 x 4.5 x 4.8 cm, simple features, unchanged from prior CT. No additional mass, hydronephrosis or shadowing calcification. Bladder: Contains minimal urine, poorly assessed. IMPRESSION: BILATERAL renal cysts. No evidence of hydronephrosis. Electronically Signed   By: Ulyses Southward M.D.   On: 09/21/2018 17:45      Subjective: Alert back to baseline.  Eating better.   Discharge Exam: Vitals:   09/21/18 2111 09/22/18  0638  BP: (!) 111/57 134/69  Pulse: 78 77  Resp: 16 16  Temp:  (!) 97.5 F (36.4 C)  SpO2: 98% 96%   Vitals:   09/21/18 2017 09/21/18 2111 09/22/18 0427 09/22/18 0638  BP: 138/70 (!) 111/57  134/69  Pulse: 80 78  77  Resp: 18 16  16   Temp: (!) 97.5 F (36.4 C)   (!) 97.5 F (36.4 C)  TempSrc: Oral   Oral  SpO2: 98% 98%  96%  Weight: 69.4 kg  69.8 kg   Height: 5\' 4"  (1.626 m)       General: Pt is alert, awake, not in acute distress Cardiovascular: RRR, S1/S2 +, no rubs, no gallops Respiratory: CTA bilaterally, no wheezing, no rhonchi Abdominal: Soft, NT, ND, bowel sounds + Extremities: no edema, no cyanosis    The results of significant diagnostics from this hospitalization (including imaging, microbiology, ancillary and laboratory) are listed below for reference.     Microbiology: Recent Results (from the past 240 hour(s))  Culture, blood (Routine x 2)     Status: None (Preliminary result)   Collection Time: 09/21/18 12:22 PM  Result Value Ref Range Status   Specimen Description   Final    BLOOD RIGHT ANTECUBITAL Performed at [facility], [address] [place], [place] [address]    Special Requests   Final    BOTTLES DRAWN AEROBIC AND ANAEROBIC Blood Culture adequate volume Performed at [facility], [address], [place], [place] [address]    Culture   Final    NO GROWTH < 24 HOURS Performed at [facility], [address], [place], [place] [address]    Report Status PENDING  Incomplete  Culture, blood (Routine x 2)     Status: None (Preliminary result)   Collection Time: 09/21/18 12:55 PM  Result Value Ref Range Status   Specimen Description   Final    BLOOD LEFT ANTECUBITAL Performed at [facility], [address], [place], [place] [address]    Special Requests   Final    BOTTLES DRAWN AEROBIC AND ANAEROBIC Blood Culture adequate volume Performed at [facility], [address], [place], [place] [address]    Culture   Final    NO GROWTH < 24 HOURS Performed at [facility], [address] [place], [place] [address]    Report Status PENDING  Incomplete     Labs: BNP (last 3 results) Recent Labs    09/21/18 1549  BNP 34.3   Basic Metabolic Panel: Recent Labs  Lab 09/21/18 1236 09/22/18 0410  NA 122* 129*  K 4.0 4.9  CL 88* 99  CO2 22 23  GLUCOSE 105* 111*  BUN 31* 30*  CREATININE 2.57* 1.30*  CALCIUM 8.3* 8.3*   Liver Function Tests: Recent Labs  Lab 09/21/18 1236  AST 15  ALT  13  ALKPHOS 54  BILITOT 0.7  PROT 6.4*  ALBUMIN 3.7   No results for input(s): LIPASE, AMYLASE in the last 168 hours. No results for input(s): AMMONIA in the last 168 hours. CBC: Recent Labs  Lab 09/21/18 1236 09/22/18 0410  WBC 11.5* 5.5  NEUTROABS 7.7  --   HGB 12.2 10.6*  HCT 38.9 33.5*  MCV 91.5 91.8  PLT 242 200   Cardiac Enzymes: No results for input(s): CKTOTAL, CKMB, CKMBINDEX, TROPONINI in the last 168 hours. BNP: Invalid input(s): POCBNP CBG: No results for input(s): GLUCAP in the last 168 hours. D-Dimer No results for input(s): DDIMER in the last 72 hours. Hgb A1c No results for input(s): HGBA1C in the last 72 hours. Lipid Profile No results for input(s): CHOL, HDL, LDLCALC, TRIG, CHOLHDL, LDLDIRECT in the last 72 hours. Thyroid function studies No results for input(s): TSH, T4TOTAL, T3FREE, THYROIDAB in the last 72 hours.  Invalid input(s): FREET3 Anemia work up Recent Labs    09/21/18 1236  VITAMINB12 434   Urinalysis    Component Value Date/Time   COLORURINE YELLOW 09/21/2018 1640   APPEARANCEUR CLEAR 09/21/2018 1640   LABSPEC 1.008 09/21/2018 1640   PHURINE 5.0 09/21/2018 1640   GLUCOSEU NEGATIVE 09/21/2018 1640   HGBUR NEGATIVE 09/21/2018 1640   BILIRUBINUR NEGATIVE 09/21/2018 1640   KETONESUR NEGATIVE 09/21/2018 1640   PROTEINUR NEGATIVE 09/21/2018 1640   UROBILINOGEN 4.0 (H) 10/31/2010 1726    NITRITE NEGATIVE 09/21/2018 1640   LEUKOCYTESUR NEGATIVE 09/21/2018 1640   Sepsis Labs Invalid input(s): PROCALCITONIN,  WBC,  LACTICIDVEN Microbiology Recent Results (from the past 240 hour(s))  Culture, blood (Routine x 2)     Status: None (Preliminary result)   Collection Time: 09/21/18 12:22 PM  Result Value Ref Range Status   Specimen Description   Final    BLOOD RIGHT ANTECUBITAL Performed at [facility], [address] [place], [place] [address]    Special Requests   Final    BOTTLES DRAWN AEROBIC AND ANAEROBIC Blood Culture adequate volume Performed at [facility], [address] [place], [place] [address]    Culture   Final    NO GROWTH < 24 HOURS Performed at [facility], [address], [place], [place] [address]    Report Status PENDING  Incomplete  Culture, blood (Routine x 2)     Status: None (Preliminary result)   Collection Time: 09/21/18 12:55 PM  Result Value Ref Range Status   Specimen Description   Final    BLOOD LEFT ANTECUBITAL Performed at [facility], [address] [place], [place] [address]    Special Requests   Final    BOTTLES DRAWN AEROBIC AND ANAEROBIC Blood Culture adequate volume Performed at [facility], [address] [place], [place] [address]    Culture   Final    NO GROWTH < 24 HOURS Performed at [facility], [address] [place], [place] [address]    Report Status PENDING  Incomplete     Time coordinating discharge: 35 minutes  SIGNED:   Alba Cory, MD  [facility] 09/22/2018, 3:03 PM   If 7PM-7AM, please contact night-coverage [email or web address] Password [email or web address]

## 2018-09-22 NOTE — Progress Notes (Signed)
  Echocardiogram 2D Echocardiogram has been performed.  Janalyn Harder 09/22/2018, 9:52 AM

## 2018-09-22 NOTE — Evaluation (Signed)
Physical Therapy Evaluation Patient Details Name: Sarah Miranda MRN: [id number] DOB: 1946/02/05 Today's Date: 09/22/2018   History of Present Illness  Patient is a 73 y/o female presenting to the ED on 09/21/18 with primary complaints of hypotension, AMS. Past medical history significant for depression, anxiety, hypertension, hypothyroidism who was referred from her primary care doctor for further evaluation and treatment of hypotension and acute renal failure.    Clinical Impression  Patient admitted with the above listed diagnosis. Patient reports Mod I with mobility with RW prior to admission with husband available to assist with ADLs as needed. Patient today performing all OOB mobility at Min guard/supervision level for general safety - no physical assist needed. Will recommend continued HHPT at discharge. No further acute PT needs identified. PT to sign off.      Follow Up Recommendations Home health PT;Supervision - Intermittent    Equipment Recommendations  None recommended by PT    Recommendations for Other Services       Precautions / Restrictions Precautions Precautions: Fall Restrictions Weight Bearing Restrictions: No      Mobility  Bed Mobility Overal bed mobility: Modified Independent                Transfers Overall transfer level: Needs assistance Equipment used: Rolling walker (2 wheeled) Transfers: Sit to/from Stand Sit to Stand: Supervision         General transfer comment: supervision only for safety; no LOB  Ambulation/Gait Ambulation/Gait assistance: Min guard;Supervision Gait Distance (Feet): 350 Feet Assistive device: Rolling walker (2 wheeled) Gait Pattern/deviations: Step-through pattern;Decreased stride length Gait velocity: WNL   General Gait Details: steady gait pattern; min guard/sup only for safety. No LOB. able to navigate obstacles without issue  Stairs            Wheelchair Mobility    Modified Rankin (Stroke  Patients Only)       Balance Overall balance assessment: Mild deficits observed, not formally tested                                           Pertinent Vitals/Pain Pain Assessment: No/denies pain    Home Living Family/patient expects to be discharged to:: Private residence Living Arrangements: Spouse/significant other Available Help at Discharge: Family Type of Home: House Home Access: Level entry     Home Layout: One level Home Equipment: Environmental consultant - 2 wheels;Tub bench;Grab bars - tub/shower;Bedside commode      Prior Function Level of Independence: Needs assistance   Gait / Transfers Assistance Needed: ambulates with RW  ADL's / Homemaking Assistance Needed: husband assist bathing and dressing; husband does cooking and cleaning  Comments: reports she has been receiving HHPT 2x/week     Hand Dominance        Extremity/Trunk Assessment   Upper Extremity Assessment Upper Extremity Assessment: Overall WFL for tasks assessed    Lower Extremity Assessment Lower Extremity Assessment: Overall WFL for tasks assessed    Cervical / Trunk Assessment Cervical / Trunk Assessment: Normal  Communication   Communication: No difficulties  Cognition Arousal/Alertness: Awake/alert Behavior During Therapy: WFL for tasks assessed/performed Overall Cognitive Status: Within Functional Limits for tasks assessed  General Comments General comments (skin integrity, edema, etc.): husband present and supportive    Exercises     Assessment/Plan    PT Assessment Patent does not need any further PT services  PT Problem List         PT Treatment Interventions      PT Goals (Current goals can be found in the Care Plan section)  Acute Rehab PT Goals Patient Stated Goal: return home today PT Goal Formulation: With patient Time For Goal Achievement: 10/06/18 Potential to Achieve Goals: Good    Frequency      Barriers to discharge        Co-evaluation               AM-PAC PT "6 Clicks" Mobility  Outcome Measure Help needed turning from your back to your side while in a flat bed without using bedrails?: None Help needed moving from lying on your back to sitting on the side of a flat bed without using bedrails?: A Little Help needed moving to and from a bed to a chair (including a wheelchair)?: A Little Help needed standing up from a chair using your arms (e.g., wheelchair or bedside chair)?: A Little Help needed to walk in hospital room?: A Little Help needed climbing 3-5 steps with a railing? : A Little 6 Click Score: 19    End of Session Equipment Utilized During Treatment: Gait belt Activity Tolerance: Patient tolerated treatment well Patient left: in bed;with call bell/phone within reach;with family/visitor present Nurse Communication: Mobility status PT Visit Diagnosis: Unsteadiness on feet (R26.81);Other abnormalities of gait and mobility (R26.89);Muscle weakness (generalized) (M62.81)    Time: [id number] PT Time Calculation (min) (ACUTE ONLY): 13 min   Charges:   PT Evaluation $PT Eval Moderate Complexity: 1 Mod           Kipp LaurenceStephanie R Aaron, PT, DPT Supplemental Physical Therapist 09/22/18 1:14 PM Pager: [phone] Office: [phone]

## 2018-09-22 NOTE — Care Management Note (Signed)
Case Management Note  Patient Details  Name: Sarah Miranda MRN: [id number] Date of Birth: 1946/04/30  Subjective/Objective:                    Action/Plan:Pt plan to continue with [facility]   Expected Discharge Date:  09/22/18               Expected Discharge Plan:  Home w Home Health Services  In-House Referral:     Discharge planning Services  CM Consult  Post Acute Care Choice:    Choice offered to:  Patient  DME Arranged:    DME Agency:     HH Arranged:  PT HH Agency:  [facility] ([facility])  Status of Service:  Completed, signed off  If discussed at [employer] of Stay Meetings, dates discussed:    Additional CommentsGeni Bers, RN 09/22/2018, 2:03 PM

## 2018-09-22 NOTE — Progress Notes (Signed)
Physician Discharge Summary  Sarah Miranda QMV:[id number] DOB: 06-07-1946 DOA: 09/21/2018  PCP: Rodrigo Ran, MD  Admit date: 09/21/2018 Discharge date: 09/22/2018  Admitted From: Home  Disposition:  Home   Recommendations for Outpatient Follow-up:  1. Follow up with PCP in 1-2 weeks 2. Please obtain BMP/CBC in one week 3. Needs follow up with cardiology for mild to moderate aortic insuficiency. Mildly reduce Systolic function.  4. Encourage oral intake.  5. Needs further evaluation of hiatal hernia.   Home Health: yes.   Discharge Condition: Stable.  CODE STATUS: full code. Diet recommendation: Regular   Brief/Interim Summary: Sarah Miranda is a 73 y.o. female with past medical history significant for depression, anxiety, hypertension, hypothyroidism who was referred from her primary care doctor for further evaluation and treatment of hypotension and acute renal failure. Patient reports that she has not been feeling well for the last week, she has not been able to eat and she has been feeling nauseated.  She report productive cough for the last several days associated with nausea.  She denies chest pain.  She does report some shortness of breath on exertion. She went to see her primary care doctor for further evaluation of cough and nausea.  During evaluation patient was found to have worsening hyponatremia, hypotension with reported systolic blood pressure in the 60s.  She was referred to the ED for further evaluation.  During route, EMS gave patient IV fluids.  Her systolic blood pressure increased to 100.  Also patient was noted to be confused and lethargic.  Evaluation  in the ED; patient was found to have a blood pressure in the 100 range.  Sodium level 122 BUN 31, creatinine 2.5, lactic acid 1.1 troponin 0 0.01, white count 11.  Chest x-ray with low lung volume and pulmonary vascular congestion.  Large he had a hernia in the left hemithorax.  Husband report, that patient has  been having poor oral intake for the last 2 to 3 months.  Worse over the last week.  ECHO; Normal LV size with mild LV hypertrophy. LV systolic low normal   to mildly reduced, EF 50-55%. Septal-lateral dyssynchrony due to   LBBB. Normal RV size and systolic function. Mild to moderate   aortic insufficiency.  1-Hypotension;  This could have be related to hypovolemia, in the setting of dehydration, in the setting of lisinopril. Blood pressure improved after IV fluids. Lactic acid was normal. Continue to hold lisinopril at discharge.  Chest x-ray negative for infection.  UA need to be obtained.  2-AKI; patient present with creatinine at 2.5. Likely in the setting of hypovolemia, poor oral intake, and ace inhibitor use. We will continue to  hold lisinopril at discharge. . Improved with IV fluids.  Cr has decrease to 1.3   3-Cough;  Chest x ray negative for Infection.   4-Dyspnea; exertion; ECHO with aortic insufficiency and mildly reduce EF.  Needs referral to cardiology   5-Hyponatremia; Per records sodium 128 in the past. Suspect this is related to dehydration. Improved with IV fluids.  Back to baseline.   6-Acute metabolic encephalopathy; Could be related to hypotension, hypovolemia. Hyponatremia.  Need to be careful with opioids. Resume  Lyrica, Prozac. We will continue with Elavil at bedtime. Back to baseline. Presume related to dehydration.    Discharge Diagnoses:  Active Problems:   Depression   Fibromyalgia   Hypothyroidism   Hyponatremia   AKI (acute kidney injury) (HCC)   Hypotension    Discharge Instructions  Discharge Instructions    Diet general   Complete by:  As directed    Increase activity slowly   Complete by:  As directed      Allergies as of 09/22/2018      Reactions   Fetzima [levomilnacipran] Nausea Only      Medication List    STOP taking these medications   acetaminophen 325 MG tablet Commonly known as:  TYLENOL   docusate  sodium 100 MG capsule Commonly known as:  COLACE   ondansetron 4 MG tablet Commonly known as:  ZOFRAN   senna 8.6 MG Tabs tablet Commonly known as:  SENOKOT     TAKE these medications   amitriptyline 25 MG tablet Commonly known as:  ELAVIL TAKE 1 TABLET(25 MG) BY MOUTH AT BEDTIME What changed:  See the new instructions.   FLUoxetine 40 MG capsule Commonly known as:  PROZAC TAKE 1 CAPSULE BY MOUTH DAILY TO HELP NERVES AND DEPRESSION What changed:  See the new instructions.   levothyroxine 25 MCG tablet Commonly known as:  SYNTHROID, LEVOTHROID TAKE 1 TABLET BY MOUTH EVERY DAY   multivitamin with minerals Tabs tablet Take 1 tablet by mouth daily.   ondansetron 4 MG disintegrating tablet Commonly known as:  ZOFRAN-ODT Take 4 mg by mouth every 8 (eight) hours as needed.   oxyCODONE-acetaminophen 5-325 MG tablet Commonly known as:  PERCOCET/ROXICET Take 1 tablet by mouth every 4 (four) hours as needed for moderate pain or severe pain.   pantoprazole 40 MG tablet Commonly known as:  PROTONIX Take 1 tablet (40 mg total) by mouth 2 (two) times daily. What changed:  when to take this   pregabalin 150 MG capsule Commonly known as:  LYRICA Take 1 capsule (150 mg total) by mouth daily.   risperiDONE 1 MG tablet Commonly known as:  RISPERDAL TAKE 1 TABLET BY MOUTH TWICE DAILY TO HELP RELAX What changed:  See the new instructions.       Allergies  Allergen Reactions  . Fetzima [Levomilnacipran] Nausea Only    Consultations:  none   Procedures/Studies: Dg Chest 2 View  Result Date: 09/22/2018 CLINICAL DATA:  Pt states that she had a cough before being admitted to the hospital for htn and acute renal failure. EXAM: CHEST - 2 VIEW COMPARISON:  09/21/2018 FINDINGS: Cardiac silhouette is borderline enlarged. Large hiatal hernia. No mediastinal or hilar masses. No evidence of adenopathy. Prominent bronchovascular markings. Lungs otherwise clear. No pleural effusion or  pneumothorax. Skeletal structures are demineralized. No acute skeletal abnormality. IMPRESSION: 1. No acute cardiopulmonary disease. 2. Large hiatal hernia. Electronically Signed   By: Amie Portland M.D.   On: 09/22/2018 11:47   Dg Chest 2 View  Result Date: 09/21/2018 CLINICAL DATA:  Hypotension.  Possible sepsis EXAM: CHEST - 2 VIEW COMPARISON:  February 05, 2018 FINDINGS: The heart size and mediastinal contours are stable. A large hernia is identified in the left hemithorax. The lung volumes are low. There is central pulmonary vascular congestion. No pleural effusion or focal pneumonia is identified. The visualized skeletal structures are stable. IMPRESSION: Low lung volumes with mild central pulmonary vascular congestion. Large hernia in the left hemithorax. Electronically Signed   By: Sherian Rein M.D.   On: 09/21/2018 13:03   US Renal  Result Date: 09/21/2018 CLINICAL DATA:  Acute kidney injury EXAM: RENAL / URINARY TRACT ULTRASOUND COMPLETE COMPARISON:  CT abdomen and pelvis 02/24/2018 FINDINGS: Right Kidney: Renal measurements: 10.2 x 3.6 x 3.8 cm = volume: 73.7 mL.  Normal cortical thickness. Upper normal cortical echogenicity. Small cyst at inferior pole 1.6 x 1.7 x 1.6 cm, unchanged. Additional tiny cyst at upper pole 8 x 8 x 8 mm, stable. No additional mass or hydronephrosis. No shadowing calcifications. Left Kidney: Renal measurements: 9.9 x 4.7 x 4.2 cm = volume: 103.5 mL. Normal cortical thickness. Upper normal cortical echogenicity. Cyst at inferior pole 5.0 x 4.5 x 4.8 cm, simple features, unchanged from prior CT. No additional mass, hydronephrosis or shadowing calcification. Bladder: Contains minimal urine, poorly assessed. IMPRESSION: BILATERAL renal cysts. No evidence of hydronephrosis. Electronically Signed   By: Ulyses SouthwardMark  Boles M.D.   On: 09/21/2018 17:45     Subjective: Alert back to baseline.  Eating better.   Discharge Exam: Vitals:   09/21/18 2111 09/22/18 0638  BP: (!) 111/57  134/69  Pulse: 78 77  Resp: 16 16  Temp:  (!) 97.5 F (36.4 C)  SpO2: 98% 96%   Vitals:   09/21/18 2017 09/21/18 2111 09/22/18 0427 09/22/18 0638  BP: 138/70 (!) 111/57  134/69  Pulse: 80 78  77  Resp: 18 16  16   Temp: (!) 97.5 F (36.4 C)   (!) 97.5 F (36.4 C)  TempSrc: Oral   Oral  SpO2: 98% 98%  96%  Weight: 69.4 kg  69.8 kg   Height: 5\' 4"  (1.626 m)       General: Pt is alert, awake, not in acute distress Cardiovascular: RRR, S1/S2 +, no rubs, no gallops Respiratory: CTA bilaterally, no wheezing, no rhonchi Abdominal: Soft, NT, ND, bowel sounds + Extremities: no edema, no cyanosis    The results of significant diagnostics from this hospitalization (including imaging, microbiology, ancillary and laboratory) are listed below for reference.     Microbiology: Recent Results (from the past 240 hour(s))  Culture, blood (Routine x 2)     Status: None (Preliminary result)   Collection Time: 09/21/18 12:22 PM  Result Value Ref Range Status   Specimen Description   Final    BLOOD RIGHT ANTECUBITAL Performed at [facility], [address] [place], [place] [address]    Special Requests   Final    BOTTLES DRAWN AEROBIC AND ANAEROBIC Blood Culture adequate volume Performed at [facility], [address] [place], [place] [address]    Culture   Final    NO GROWTH < 24 HOURS Performed at [facility], [address] [place], [place] [address]    Report Status PENDING  Incomplete  Culture, blood (Routine x 2)     Status: None (Preliminary result)   Collection Time: 09/21/18 12:55 PM  Result Value Ref Range Status   Specimen Description   Final    BLOOD LEFT ANTECUBITAL Performed at [facility], [address] [place], [place] [address]    Special Requests   Final    BOTTLES DRAWN AEROBIC AND ANAEROBIC Blood Culture adequate volume Performed at [facility], [address]  [place], [place] [address]    Culture   Final    NO GROWTH < 24 HOURS Performed at [facility], [address] [place], [place] [address]    Report Status PENDING  Incomplete     Labs: BNP (last 3 results) Recent Labs    09/21/18 1549  BNP 34.3   Basic Metabolic Panel: Recent Labs  Lab 09/21/18 1236 09/22/18 0410  NA 122* 129*  K 4.0 4.9  CL 88* 99  CO2  22 23  GLUCOSE 105* 111*  BUN 31* 30*  CREATININE 2.57* 1.30*  CALCIUM 8.3* 8.3*   Liver Function Tests: Recent Labs  Lab 09/21/18 1236  AST 15  ALT 13  ALKPHOS 54  BILITOT 0.7  PROT 6.4*  ALBUMIN 3.7   No results for input(s): LIPASE, AMYLASE in the last 168 hours. No results for input(s): AMMONIA in the last 168 hours. CBC: Recent Labs  Lab 09/21/18 1236 09/22/18 0410  WBC 11.5* 5.5  NEUTROABS 7.7  --   HGB 12.2 10.6*  HCT 38.9 33.5*  MCV 91.5 91.8  PLT 242 200   Cardiac Enzymes: No results for input(s): CKTOTAL, CKMB, CKMBINDEX, TROPONINI in the last 168 hours. BNP: Invalid input(s): POCBNP CBG: No results for input(s): GLUCAP in the last 168 hours. D-Dimer No results for input(s): DDIMER in the last 72 hours. Hgb A1c No results for input(s): HGBA1C in the last 72 hours. Lipid Profile No results for input(s): CHOL, HDL, LDLCALC, TRIG, CHOLHDL, LDLDIRECT in the last 72 hours. Thyroid function studies No results for input(s): TSH, T4TOTAL, T3FREE, THYROIDAB in the last 72 hours.  Invalid input(s): FREET3 Anemia work up Recent Labs    09/21/18 1236  VITAMINB12 434   Urinalysis    Component Value Date/Time   COLORURINE YELLOW 09/21/2018 1640   APPEARANCEUR CLEAR 09/21/2018 1640   LABSPEC 1.008 09/21/2018 1640   PHURINE 5.0 09/21/2018 1640   GLUCOSEU NEGATIVE 09/21/2018 1640   HGBUR NEGATIVE 09/21/2018 1640   BILIRUBINUR NEGATIVE 09/21/2018 1640   KETONESUR NEGATIVE 09/21/2018 1640   PROTEINUR NEGATIVE 09/21/2018 1640   UROBILINOGEN 4.0 (H) 10/31/2010 1726   NITRITE NEGATIVE  09/21/2018 1640   LEUKOCYTESUR NEGATIVE 09/21/2018 1640   Sepsis Labs Invalid input(s): PROCALCITONIN,  WBC,  LACTICIDVEN Microbiology Recent Results (from the past 240 hour(s))  Culture, blood (Routine x 2)     Status: None (Preliminary result)   Collection Time: 09/21/18 12:22 PM  Result Value Ref Range Status   Specimen Description   Final    BLOOD RIGHT ANTECUBITAL Performed at [facility] [facility], [address] [place], [place] [address]    Special Requests   Final    BOTTLES DRAWN AEROBIC AND ANAEROBIC Blood Culture adequate volume Performed at [facility], [address] [place], [place] [address]    Culture   Final    NO GROWTH < 24 HOURS Performed at [facility], [address] [place], [place] [address]    Report Status PENDING  Incomplete  Culture, blood (Routine x 2)     Status: None (Preliminary result)   Collection Time: 09/21/18 12:55 PM  Result Value Ref Range Status   Specimen Description   Final    BLOOD LEFT ANTECUBITAL Performed at [facility], [address] [place], [place] [address]    Special Requests   Final    BOTTLES DRAWN AEROBIC AND ANAEROBIC Blood Culture adequate volume Performed at [facility], [address] [place], [place] [address]    Culture   Final    NO GROWTH < 24 HOURS Performed at [facility], [address] [place], [place] [address]    Report Status PENDING  Incomplete     Time coordinating discharge: 35 minutes  SIGNED:   Alba CoryBelkys A Latarra Eagleton, MD  [facility] 09/22/2018, 12:09 PM Pager   If 7PM-7AM, please contact night-coverage [email or web address] Password [email or web address]

## 2018-09-26 ENCOUNTER — Ambulatory Visit: Payer: Medicare Other | Admitting: Gastroenterology

## 2018-09-26 LAB — CULTURE, BLOOD (ROUTINE X 2)
CULTURE: NO GROWTH
Culture: NO GROWTH
SPECIAL REQUESTS: ADEQUATE
Special Requests: ADEQUATE

## 2018-11-18 ENCOUNTER — Observation Stay (HOSPITAL_COMMUNITY)
Admission: EM | Admit: 2018-11-18 | Discharge: 2018-11-19 | Disposition: A | Discharge status: Home / Self Care | Payer: Medicare Other | Attending: Internal Medicine | Admitting: Internal Medicine

## 2018-11-18 ENCOUNTER — Other Ambulatory Visit: Payer: Self-pay

## 2018-11-18 ENCOUNTER — Emergency Department (HOSPITAL_COMMUNITY): Payer: Medicare Other

## 2018-11-18 DIAGNOSIS — D696 Thrombocytopenia, unspecified: Secondary | ICD-10-CM | POA: Diagnosis not present

## 2018-11-18 DIAGNOSIS — N179 Acute kidney failure, unspecified: Secondary | ICD-10-CM | POA: Diagnosis present

## 2018-11-18 DIAGNOSIS — R4020 Unspecified coma: Secondary | ICD-10-CM

## 2018-11-18 DIAGNOSIS — E039 Hypothyroidism, unspecified: Secondary | ICD-10-CM | POA: Diagnosis not present

## 2018-11-18 DIAGNOSIS — Z888 Allergy status to other drugs, medicaments and biological substances status: Secondary | ICD-10-CM | POA: Diagnosis not present

## 2018-11-18 DIAGNOSIS — I447 Left bundle-branch block, unspecified: Secondary | ICD-10-CM | POA: Insufficient documentation

## 2018-11-18 DIAGNOSIS — E871 Hypo-osmolality and hyponatremia: Secondary | ICD-10-CM | POA: Diagnosis present

## 2018-11-18 DIAGNOSIS — E0590 Thyrotoxicosis, unspecified without thyrotoxic crisis or storm: Secondary | ICD-10-CM | POA: Diagnosis not present

## 2018-11-18 DIAGNOSIS — M797 Fibromyalgia: Secondary | ICD-10-CM | POA: Insufficient documentation

## 2018-11-18 DIAGNOSIS — N289 Disorder of kidney and ureter, unspecified: Secondary | ICD-10-CM

## 2018-11-18 DIAGNOSIS — Z79891 Long term (current) use of opiate analgesic: Secondary | ICD-10-CM | POA: Diagnosis not present

## 2018-11-18 DIAGNOSIS — R000 Tachycardia, unspecified: Secondary | ICD-10-CM | POA: Insufficient documentation

## 2018-11-18 DIAGNOSIS — I9589 Other hypotension: Secondary | ICD-10-CM | POA: Diagnosis present

## 2018-11-18 DIAGNOSIS — I959 Hypotension, unspecified: Secondary | ICD-10-CM | POA: Diagnosis not present

## 2018-11-18 DIAGNOSIS — G8929 Other chronic pain: Secondary | ICD-10-CM | POA: Diagnosis present

## 2018-11-18 DIAGNOSIS — F319 Bipolar disorder, unspecified: Secondary | ICD-10-CM | POA: Diagnosis present

## 2018-11-18 DIAGNOSIS — Z79890 Hormone replacement therapy (postmenopausal): Secondary | ICD-10-CM | POA: Diagnosis not present

## 2018-11-18 DIAGNOSIS — F419 Anxiety disorder, unspecified: Secondary | ICD-10-CM | POA: Insufficient documentation

## 2018-11-18 DIAGNOSIS — K219 Gastro-esophageal reflux disease without esophagitis: Secondary | ICD-10-CM | POA: Diagnosis not present

## 2018-11-18 DIAGNOSIS — I10 Essential (primary) hypertension: Secondary | ICD-10-CM | POA: Diagnosis not present

## 2018-11-18 DIAGNOSIS — Z79899 Other long term (current) drug therapy: Secondary | ICD-10-CM | POA: Insufficient documentation

## 2018-11-18 DIAGNOSIS — E861 Hypovolemia: Secondary | ICD-10-CM

## 2018-11-18 LAB — CBC WITH DIFFERENTIAL/PLATELET
Abs Immature Granulocytes: 0.05 10*3/uL (ref 0.00–0.07)
Basophils Absolute: 0 10*3/uL (ref 0.0–0.1)
Basophils Relative: 0 %
Eosinophils Absolute: 0.2 10*3/uL (ref 0.0–0.5)
Eosinophils Relative: 2 %
HCT: 40.7 % (ref 36.0–46.0)
Hemoglobin: 13.4 g/dL (ref 12.0–15.0)
IMMATURE GRANULOCYTES: 1 %
Lymphocytes Relative: 27 %
Lymphs Abs: 2.3 10*3/uL (ref 0.7–4.0)
MCH: 30.2 pg (ref 26.0–34.0)
MCHC: 32.9 g/dL (ref 30.0–36.0)
MCV: 91.9 fL (ref 80.0–100.0)
Monocytes Absolute: 1 10*3/uL (ref 0.1–1.0)
Monocytes Relative: 12 %
NEUTROS PCT: 58 %
NRBC: 0 % (ref 0.0–0.2)
Neutro Abs: 5.2 10*3/uL (ref 1.7–7.7)
Platelets: 106 10*3/uL — ABNORMAL LOW (ref 150–400)
RBC: 4.43 MIL/uL (ref 3.87–5.11)
RDW: 13.9 % (ref 11.5–15.5)
WBC: 8.8 10*3/uL (ref 4.0–10.5)

## 2018-11-18 LAB — I-STAT TROPONIN, ED: TROPONIN I, POC: 0.01 ng/mL (ref 0.00–0.08)

## 2018-11-18 LAB — BASIC METABOLIC PANEL
Anion gap: 10 (ref 5–15)
BUN: 15 mg/dL (ref 8–23)
CALCIUM: 8.8 mg/dL — AB (ref 8.9–10.3)
CO2: 18 mmol/L — ABNORMAL LOW (ref 22–32)
Chloride: 104 mmol/L (ref 98–111)
Creatinine, Ser: 1.62 mg/dL — ABNORMAL HIGH (ref 0.44–1.00)
GFR calc Af Amer: 36 mL/min — ABNORMAL LOW (ref >60)
GFR calc non Af Amer: 31 mL/min — ABNORMAL LOW (ref >60)
Glucose, Bld: 120 mg/dL — ABNORMAL HIGH (ref 70–99)
Potassium: 3.5 mmol/L (ref 3.5–5.1)
SODIUM: 132 mmol/L — AB (ref 135–145)

## 2018-11-18 MED ORDER — SODIUM CHLORIDE 0.9 % IV BOLUS
500.0000 mL | Freq: Once | INTRAVENOUS | Status: AC
  Administered 2018-11-19: 500 mL via INTRAVENOUS

## 2018-11-18 MED ORDER — SODIUM CHLORIDE 0.9 % IV BOLUS
500.0000 mL | Freq: Once | INTRAVENOUS | Status: AC
  Administered 2018-11-18: 500 mL via INTRAVENOUS

## 2018-11-18 NOTE — ED Provider Notes (Signed)
[facility] EMERGENCY DEPARTMENT Provider Note   CSN: [id number] Arrival date & time: 11/18/18  2145    History   Chief Complaint Chief Complaint  Patient presents with  . Loss of Consciousness    HPI Sarah Miranda is a 73 y.o. female.     Patient with history of hypertension, osteoporosis, overnight admission in 09/2018 for syncope and hypotension --presents to the emergency department today after syncopal episode.  Patient was at home tonight and was resting on a couch.  Her husband was working in his office.  He came out to talk with her and found her unresponsive.  He shook her and tried to wake her up for approximately 10 minutes and called the ambulance.  Blood pressure was found to be in the 70 systolic range by EMS.  They gave fluids with improvement in blood pressure to 110/70.  Patient improved while in the ambulance.  She currently has generalized weakness.  No chest pain or shortness of breath.  Denies blood in urine, stool.  Has not urinary Patient states that she has not been eating or drinking well today.  Husband reports that she has not had any water.  She ate part of a pancake for breakfast.  She has taken 2 doses of oxycodone today as well as risperidone.  She was previously on blood pressure medication which was discontinued in January however restarted by Dr. Waynard Edwards per patient history.  Echo at last admission showed grade 1 diastolic dysfunction, EF 50-55%.  Patient is not on any blood thinners.  She has not taken amitriptyline today.  She is chronic back pain from multiple vertebral fractures.     Past Medical History:  Diagnosis Date  . Abnormal involuntary movements(781.0)   . Alopecia, unspecified   . Anemia, unspecified   . Anxiety   . Bipolar disorder, unspecified (HCC)   . Depression   . Dysuria   . Edema   . Encounter for long-term (current) use of other medications   . Esophageal reflux   . Fibromyalgia   . Hyperthyroidism   .  Insomnia   . Lumbago   . Nonspecific elevation of levels of transaminase or lactic acid dehydrogenase (LDH)   . Other abnormal blood chemistry    hyperglycemia  . Other malaise and fatigue   . Pathologic fracture of vertebrae   . Reflux esophagitis   . Syncope and collapse   . Tachycardia, unspecified   . Unspecified essential hypertension   . Unspecified hypothyroidism     Patient Active Problem List   Diagnosis Date Noted  . AKI (acute kidney injury) (HCC) 09/21/2018  . Hypotension 09/21/2018  . Acute back pain 04/18/2018  . Iron deficiency anemia   . Gastroesophageal reflux disease with esophagitis   . Hematochezia 02/24/2018  . Compression fracture of T7 vertebra (HCC) 02/24/2018  . Tachycardia 02/24/2018  . Uncomplicated opioid dependence (HCC) 09/25/2017  . Symptomatic anemia 05/20/2017  . Nausea without vomiting 06/23/2016  . Corn 06/23/2016  . Callus 06/02/2014  . Hyponatremia 01/22/2014  . Depression   . Insomnia   . Fibromyalgia   . Anxiety   . Hypothyroidism   . Hyperglycemia   . Malaise   . Bipolar disorder (HCC)   . Edema   . Essential hypertension     Past Surgical History:  Procedure Laterality Date  . BIOPSY  02/25/2018   Procedure: BIOPSY;  Surgeon: Meryl Dare, MD;  Location: [facility] ENDOSCOPY;  Service: Endoscopy;;  .  CHOLECYSTECTOMY  10/2010   complicated by bile leak  . COLONOSCOPY WITH PROPOFOL N/A 02/25/2018   Procedure: COLONOSCOPY WITH PROPOFOL;  Surgeon: Meryl Dare, MD;  Location: [facility] ENDOSCOPY;  Service: Endoscopy;  Laterality: N/A;  . ERCP  11/03/2010   for bile leak  . ESOPHAGOGASTRODUODENOSCOPY (EGD) WITH PROPOFOL N/A 02/25/2018   Procedure: ESOPHAGOGASTRODUODENOSCOPY (EGD) WITH PROPOFOL;  Surgeon: Meryl Dare, MD;  Location: [facility] ENDOSCOPY;  Service: Endoscopy;  Laterality: N/A;  . LAPAROSCOPIC TUBAL LIGATION    . TONSILLECTOMY       OB History   No obstetric history on file.      Home Medications    Prior to  Admission medications   Medication Sig Start Date End Date Taking? Authorizing Provider  amitriptyline (ELAVIL) 25 MG tablet TAKE 1 TABLET(25 MG) BY MOUTH AT BEDTIME Patient taking differently: Take 25 mg by mouth at bedtime.  01/31/18   Reed, Tiffany L, DO  FLUoxetine (PROZAC) 40 MG capsule TAKE 1 CAPSULE BY MOUTH DAILY TO HELP NERVES AND DEPRESSION Patient taking differently: Take 40 mg by mouth daily.  01/12/18   Reed, Tiffany L, DO  levothyroxine (SYNTHROID, LEVOTHROID) 25 MCG tablet TAKE 1 TABLET BY MOUTH EVERY DAY 01/05/18   Reed, Tiffany L, DO  Multiple Vitamin (MULTIVITAMIN WITH MINERALS) TABS tablet Take 1 tablet by mouth daily.    [provider]  ondansetron (ZOFRAN-ODT) 4 MG disintegrating tablet Take 4 mg by mouth every 8 (eight) hours as needed. 07/17/18   [provider]  oxyCODONE-acetaminophen (PERCOCET/ROXICET) 5-325 MG tablet Take 1 tablet by mouth every 4 (four) hours as needed for moderate pain or severe pain. 04/22/18   Rolly Salter, MD  pantoprazole (PROTONIX) 40 MG tablet Take 1 tablet (40 mg total) by mouth 2 (two) times daily. 09/22/18   Regalado, Belkys A, MD  pregabalin (LYRICA) 150 MG capsule Take 1 capsule (150 mg total) by mouth daily. 01/19/18   Reed, Tiffany L, DO  risperiDONE (RISPERDAL) 1 MG tablet TAKE 1 TABLET BY MOUTH TWICE DAILY TO HELP RELAX Patient taking differently: Take 1 mg by mouth at bedtime.  06/13/17   Kermit Balo, DO    Family History Family History  Problem Relation Age of Onset  . Cancer Father        prostate  . Colon cancer Neg Hx     Social History Social History   Tobacco Use  . Smoking status: Never Smoker  . Smokeless tobacco: Never Used  Substance Use Topics  . Alcohol use: No    Alcohol/week: 0.0 standard drinks  . Drug use: No     Allergies   Fetzima [levomilnacipran]   Review of Systems Review of Systems  Constitutional: Negative for diaphoresis and fever.  Eyes: Negative for redness.   Respiratory: Negative for cough and shortness of breath.   Cardiovascular: Negative for chest pain, palpitations and leg swelling.  Gastrointestinal: Negative for abdominal pain, nausea and vomiting.  Genitourinary: Negative for dysuria.  Musculoskeletal: Negative for back pain and neck pain.  Skin: Negative for rash.  Neurological: Positive for syncope and weakness (generalized). Negative for light-headedness.  Psychiatric/Behavioral: The patient is not nervous/anxious.      Physical Exam Updated Vital Signs BP (!) 95/56   Pulse (!) 106   Resp (!) 22   Ht 5\' 4"  (1.626 m)   Wt 68 kg   SpO2 100%   BMI 25.75 kg/m   Physical Exam Vitals signs and nursing note reviewed.  Constitutional:  Appearance: She is well-developed.  HENT:     Head: Normocephalic and atraumatic.     Mouth/Throat:     Mouth: Mucous membranes are dry.     Comments: Moderately dry mucous membranes. Eyes:     General:        Right eye: No discharge.        Left eye: No discharge.     Conjunctiva/sclera: Conjunctivae normal.  Neck:     Musculoskeletal: Normal range of motion and neck supple.  Cardiovascular:     Rate and Rhythm: Normal rate and regular rhythm.     Heart sounds: Normal heart sounds.  Pulmonary:     Effort: Pulmonary effort is normal.     Breath sounds: Normal breath sounds.  Abdominal:     Palpations: Abdomen is soft.     Tenderness: There is no abdominal tenderness. There is no guarding or rebound.  Musculoskeletal:        General: No swelling or tenderness.  Skin:    General: Skin is warm and dry.  Neurological:     General: No focal deficit present.     Mental Status: She is alert and oriented to person, place, and time. Mental status is at baseline.     Cranial Nerves: No cranial nerve deficit.     Motor: No weakness.      ED Treatments / Results  Labs (all labs ordered are listed, but only abnormal results are displayed) Labs Reviewed  CBC WITH  DIFFERENTIAL/PLATELET - Abnormal; Notable for the following components:      Result Value   Platelets 106 (*)    All other components within normal limits  BASIC METABOLIC PANEL - Abnormal; Notable for the following components:   Sodium 132 (*)    CO2 18 (*)    Glucose, Bld 120 (*)    Creatinine, Ser 1.62 (*)    Calcium 8.8 (*)    GFR calc non Af Amer 31 (*)    GFR calc Af Amer 36 (*)    All other components within normal limits  URINALYSIS, ROUTINE W REFLEX MICROSCOPIC  I-STAT TROPONIN, ED    EKG EKG Interpretation  Date/Time:  Saturday November 18 2018 21:50:16 EDT Ventricular Rate:  88 PR Interval:    QRS Duration: 138 QT Interval:  412 QTC Calculation: 499 R Axis:   -37 Text Interpretation:  Sinus rhythm Left bundle branch block No significant change since last tracing Confirmed by Frederick Peers [phone]) on 11/18/2018 9:57:27 PM   Radiology Dg Chest 2 View  Result Date: 11/18/2018 CLINICAL DATA:  Syncope EXAM: CHEST - 2 VIEW COMPARISON:  09/22/2018, 09/21/2018, 02/24/2018, CT chest 02/24/2018 FINDINGS: Large hiatal hernia. No acute opacity or pleural effusion. Partially obscured cardiomediastinal silhouette, likely enlarged. No pneumothorax IMPRESSION: No active cardiopulmonary disease.  Large hiatal hernia Electronically Signed   By: Jasmine Pang M.D.   On: 11/18/2018 22:55    Procedures Procedures (including critical care time)  Medications Ordered in ED Medications  sodium chloride 0.9 % bolus 500 mL (has no administration in time range)  sodium chloride 0.9 % bolus 500 mL ( Intravenous Stopped 11/18/18 2321)  sodium chloride 0.9 % bolus 500 mL (500 mLs Intravenous New Bag/Given 11/18/18 2324)  sodium chloride 0.9 % bolus 500 mL (500 mLs Intravenous New Bag/Given 11/18/18 2335)     Initial Impression / Assessment and Plan / ED Course  I have reviewed the triage vital signs and the nursing notes.  Pertinent labs & imaging  results that were available during my care  of the patient were reviewed by me and considered in my medical decision making (see chart for details).        Patient seen and examined. Additional fluids ordered. She is mentating well. No focal neuro deficits. Check labs, orthostatics. Will need to d/c BP meds.   Vital signs reviewed and are as follows: BP (!) 95/56   Pulse (!) 106   Resp (!) 22   Ht  (1.626 m)   Wt 68 kg   SpO2 100%   BMI 25.75 kg/m   11:27 PM patient with continued low blood pressure after 1000 cc normal saline, additional fluids ordered.  Dr. Clarene Duke to see.  Baseline creatinine appears to be in the 0.6-0.8 range.  It was 1.3 at discharge in January and is now 1.6.  Patient will be admitted to the hospital for further hydration and blood pressure management.  Likely multifactorial.   11:46 PM spoke with Dr. Antionette Char -- he will see patient.  Will admit to stepdown if blood pressure continues to improve.  Dr. Consuella Lose aware of patient.  CRITICAL CARE Performed by: Renne Crigler  PA-C Total critical care time: 30 minutes Critical care time was exclusive of separately billable procedures and treating other patients. Critical care was necessary to treat or prevent imminent or life-threatening deterioration. Critical care was time spent personally by me on the following activities: development of treatment plan with patient and/or surrogate as well as nursing, discussions with consultants, evaluation of patient's response to treatment, examination of patient, obtaining history from patient or surrogate, ordering and performing treatments and interventions, ordering and review of laboratory studies, ordering and review of radiographic studies, pulse oximetry and re-evaluation of patient's condition.     Final Clinical Impressions(s) / ED Diagnoses   Final diagnoses:  Loss of consciousness (HCC)  AKI (acute kidney injury) (HCC)  Hypotension due to hypovolemia   Admit.   ED Discharge Orders    None        Renne Crigler, Cordelia Poche 11/18/18 2347    Little, Ambrose Finland, MD 11/18/18 [phone]

## 2018-11-18 NOTE — ED Triage Notes (Signed)
Pt coming by EMS after husband finding her unconsciousness on the couch. States that he had been talking to her around 2000, then came back into to the room at 2100 and she would not move or answer him. Upon EMS arrival BP was in 70's systolic and pt had faint pulses. After several minutes of attempted stimulation by EMS pt began becoming more alert and responsive. CBG 149. Takes oxy for chronic back pain but reports not taking any more pills than normal. Hx of left bundle branch block. EMS gave 500 bolus while in route which brought it up to 110/70, then started dropping again after bolus finished. BP now 95/56

## 2018-11-18 NOTE — ED Notes (Signed)
ED Provider at bedside. 

## 2018-11-19 ENCOUNTER — Encounter (HOSPITAL_COMMUNITY): Payer: Self-pay | Admitting: Family Medicine

## 2018-11-19 DIAGNOSIS — E871 Hypo-osmolality and hyponatremia: Secondary | ICD-10-CM | POA: Diagnosis not present

## 2018-11-19 DIAGNOSIS — I9589 Other hypotension: Secondary | ICD-10-CM | POA: Diagnosis not present

## 2018-11-19 DIAGNOSIS — G894 Chronic pain syndrome: Secondary | ICD-10-CM

## 2018-11-19 DIAGNOSIS — G8929 Other chronic pain: Secondary | ICD-10-CM | POA: Diagnosis present

## 2018-11-19 DIAGNOSIS — F3175 Bipolar disorder, in partial remission, most recent episode depressed: Secondary | ICD-10-CM

## 2018-11-19 DIAGNOSIS — E861 Hypovolemia: Secondary | ICD-10-CM

## 2018-11-19 DIAGNOSIS — N289 Disorder of kidney and ureter, unspecified: Secondary | ICD-10-CM

## 2018-11-19 DIAGNOSIS — D696 Thrombocytopenia, unspecified: Secondary | ICD-10-CM | POA: Diagnosis present

## 2018-11-19 LAB — BASIC METABOLIC PANEL
Anion gap: 8 (ref 5–15)
BUN: 13 mg/dL (ref 8–23)
CHLORIDE: 110 mmol/L (ref 98–111)
CO2: 20 mmol/L — AB (ref 22–32)
Calcium: 8.1 mg/dL — ABNORMAL LOW (ref 8.9–10.3)
Creatinine, Ser: 1.26 mg/dL — ABNORMAL HIGH (ref 0.44–1.00)
GFR calc Af Amer: 49 mL/min — ABNORMAL LOW (ref >60)
GFR calc non Af Amer: 42 mL/min — ABNORMAL LOW (ref >60)
Glucose, Bld: 112 mg/dL — ABNORMAL HIGH (ref 70–99)
Potassium: 3.3 mmol/L — ABNORMAL LOW (ref 3.5–5.1)
Sodium: 138 mmol/L (ref 135–145)

## 2018-11-19 LAB — CBC WITH DIFFERENTIAL/PLATELET
Abs Immature Granulocytes: 0.04 10*3/uL (ref 0.00–0.07)
Basophils Absolute: 0 10*3/uL (ref 0.0–0.1)
Basophils Relative: 0 %
Eosinophils Absolute: 0.2 10*3/uL (ref 0.0–0.5)
Eosinophils Relative: 3 %
HCT: 39.1 % (ref 36.0–46.0)
Hemoglobin: 12 g/dL (ref 12.0–15.0)
Immature Granulocytes: 1 %
Lymphocytes Relative: 28 %
Lymphs Abs: 1.9 10*3/uL (ref 0.7–4.0)
MCH: 28.9 pg (ref 26.0–34.0)
MCHC: 30.7 g/dL (ref 30.0–36.0)
MCV: 94.2 fL (ref 80.0–100.0)
Monocytes Absolute: 0.6 10*3/uL (ref 0.1–1.0)
Monocytes Relative: 8 %
Neutro Abs: 4 10*3/uL (ref 1.7–7.7)
Neutrophils Relative %: 60 %
Platelets: 191 10*3/uL (ref 150–400)
RBC: 4.15 MIL/uL (ref 3.87–5.11)
RDW: 13.9 % (ref 11.5–15.5)
WBC: 6.8 10*3/uL (ref 4.0–10.5)
nRBC: 0 % (ref 0.0–0.2)

## 2018-11-19 LAB — TECHNOLOGIST SMEAR REVIEW

## 2018-11-19 LAB — IRON AND TIBC
Iron: 18 ug/dL — ABNORMAL LOW (ref 28–170)
Saturation Ratios: 7 % — ABNORMAL LOW (ref 10.4–31.8)
TIBC: 246 ug/dL — ABNORMAL LOW (ref 250–450)
UIBC: 228 ug/dL

## 2018-11-19 LAB — HEPATIC FUNCTION PANEL
ALT: 9 U/L (ref 0–44)
AST: 14 U/L — ABNORMAL LOW (ref 15–41)
Albumin: 3.2 g/dL — ABNORMAL LOW (ref 3.5–5.0)
Alkaline Phosphatase: 49 U/L (ref 38–126)
Bilirubin, Direct: 0.1 mg/dL (ref 0.0–0.2)
Indirect Bilirubin: 0.3 mg/dL (ref 0.3–0.9)
Total Bilirubin: 0.4 mg/dL (ref 0.3–1.2)
Total Protein: 5.2 g/dL — ABNORMAL LOW (ref 6.5–8.1)

## 2018-11-19 LAB — RETICULOCYTES
Immature Retic Fract: 10 % (ref 2.3–15.9)
RBC.: 4.08 MIL/uL (ref 3.87–5.11)
Retic Count, Absolute: 66.1 10*3/uL (ref 19.0–186.0)
Retic Ct Pct: 1.6 % (ref 0.4–3.1)

## 2018-11-19 LAB — GLUCOSE, CAPILLARY: Glucose-Capillary: 70 mg/dL (ref 70–99)

## 2018-11-19 LAB — HIV ANTIBODY (ROUTINE TESTING W REFLEX): HIV Screen 4th Generation wRfx: NONREACTIVE

## 2018-11-19 LAB — VITAMIN B12: Vitamin B-12: 324 pg/mL (ref 180–914)

## 2018-11-19 LAB — FOLATE: Folate: 22.7 ng/mL (ref >5.9)

## 2018-11-19 LAB — TSH: TSH: 1.259 u[IU]/mL (ref 0.350–4.500)

## 2018-11-19 LAB — LACTIC ACID, PLASMA: Lactic Acid, Venous: 0.8 mmol/L (ref 0.5–1.9)

## 2018-11-19 LAB — FERRITIN: Ferritin: 36 ng/mL (ref 11–307)

## 2018-11-19 MED ORDER — ONDANSETRON HCL 4 MG PO TABS: 4.0000 mg | ORAL_TABLET | Freq: Four times a day (QID) | ORAL | Status: DC | PRN

## 2018-11-19 MED ORDER — SODIUM CHLORIDE 0.9 % IV BOLUS
1000.0000 mL | Freq: Once | INTRAVENOUS | Status: AC
  Administered 2018-11-19: 1000 mL via INTRAVENOUS

## 2018-11-19 MED ORDER — ACETAMINOPHEN 650 MG RE SUPP: 650.0000 mg | Freq: Four times a day (QID) | RECTAL | Status: DC | PRN

## 2018-11-19 MED ORDER — SODIUM CHLORIDE 0.9 % IV SOLN
INTRAVENOUS | Status: AC
  Administered 2018-11-19: 02:00:00 via INTRAVENOUS

## 2018-11-19 MED ORDER — POTASSIUM CHLORIDE CRYS ER 20 MEQ PO TBCR
40.0000 meq | EXTENDED_RELEASE_TABLET | Freq: Once | ORAL | Status: AC
  Administered 2018-11-19: 40 meq via ORAL
  Filled 2018-11-19: qty 2

## 2018-11-19 MED ORDER — OXYCODONE-ACETAMINOPHEN 5-325 MG PO TABS: 1.0000 | ORAL_TABLET | ORAL | Status: DC | PRN

## 2018-11-19 MED ORDER — POLYETHYLENE GLYCOL 3350 17 G PO PACK: 17.0000 g | PACK | Freq: Every day | ORAL | Status: DC | PRN

## 2018-11-19 MED ORDER — TRAMADOL HCL 50 MG PO TABS
50.0000 mg | ORAL_TABLET | Freq: Once | ORAL | Status: AC
  Administered 2018-11-19: 50 mg via ORAL
  Filled 2018-11-19: qty 1

## 2018-11-19 MED ORDER — SODIUM CHLORIDE 0.9% FLUSH: 3.0000 mL | Freq: Two times a day (BID) | INTRAVENOUS | Status: DC

## 2018-11-19 MED ORDER — PREGABALIN 75 MG PO CAPS
150.0000 mg | ORAL_CAPSULE | Freq: Every day | ORAL | Status: DC
  Administered 2018-11-19: 150 mg via ORAL
  Filled 2018-11-19: qty 2

## 2018-11-19 MED ORDER — LEVOTHYROXINE SODIUM 25 MCG PO TABS
25.0000 ug | ORAL_TABLET | Freq: Every day | ORAL | Status: DC
  Administered 2018-11-19: 25 ug via ORAL
  Filled 2018-11-19: qty 1

## 2018-11-19 MED ORDER — ACETAMINOPHEN 325 MG PO TABS: 650.0000 mg | ORAL_TABLET | Freq: Four times a day (QID) | ORAL | Status: DC | PRN

## 2018-11-19 MED ORDER — ONDANSETRON HCL 4 MG/2ML IJ SOLN: 4.0000 mg | Freq: Four times a day (QID) | INTRAMUSCULAR | Status: DC | PRN

## 2018-11-19 MED ORDER — PANTOPRAZOLE SODIUM 40 MG PO TBEC
40.0000 mg | DELAYED_RELEASE_TABLET | Freq: Every day | ORAL | Status: DC
  Administered 2018-11-19: 40 mg via ORAL
  Filled 2018-11-19: qty 1

## 2018-11-19 NOTE — ED Notes (Signed)
ED TO INPATIENT HANDOFF REPORT  ED Nurse Name and Phone #:  Marquita Palms  [phone]  S Name/Age/Gender Sarah Miranda 73 y.o. female Room/Bed: 032C/032C  Code Status   Code Status: Full Code  Home/SNF/Other Home Patient oriented to: self, place, time and situation Is this baseline? Yes   Triage Complete: Triage complete  Chief Complaint syncopal episode  Triage Note Pt coming by EMS after husband finding her unconsciousness on the couch. States that he had been talking to her around 2000, then came back into to the room at 2100 and she would not move or answer him. Upon EMS arrival BP was in 70's systolic and pt had faint pulses. After several minutes of attempted stimulation by EMS pt began becoming more alert and responsive. CBG 149. Takes oxy for chronic back pain but reports not taking any more pills than normal. Hx of left bundle branch block. EMS gave 500 bolus while in route which brought it up to 110/70, then started dropping again after bolus finished. BP now 95/56   Allergies Allergies  Allergen Reactions  . Fetzima [Levomilnacipran] Nausea Only    Level of Care/Admitting Diagnosis ED Disposition    ED Disposition Condition Comment   Admit  Hospital Area[facility] [[id number]]  Level of Care: Progressive [102]  I expect the patient will be discharged within 24 hours: Yes  LOW acuity---Tx typically complete <24 hrs---ACUTE conditions typically can be evaluated <24 hours---LABS likely to return to acceptable levels <24 hours---IS near functional baseline---EXPECTED to return to current living arrangement---NOT newly hypoxic: Does not meet criteria for 5C-Observation unit  Diagnosis: Hypotension [574734]  Admitting Physician: Briscoe Deutscher [[id number]]  Attending Physician: Briscoe Deutscher [[id number]]  PT Class (Do Not Modify): Observation [104]  PT Acc Code (Do Not Modify): Observation [10022]       B Medical/Surgery History Past Medical History:   Diagnosis Date  . Abnormal involuntary movements(781.0)   . Alopecia, unspecified   . Anemia, unspecified   . Anxiety   . Bipolar disorder, unspecified (HCC)   . Depression   . Dysuria   . Edema   . Encounter for long-term (current) use of other medications   . Esophageal reflux   . Fibromyalgia   . Hyperthyroidism   . Insomnia   . Lumbago   . Nonspecific elevation of levels of transaminase or lactic acid dehydrogenase (LDH)   . Other abnormal blood chemistry    hyperglycemia  . Other malaise and fatigue   . Pathologic fracture of vertebrae   . Reflux esophagitis   . Syncope and collapse   . Tachycardia, unspecified   . Unspecified essential hypertension   . Unspecified hypothyroidism    Past Surgical History:  Procedure Laterality Date  . BIOPSY  02/25/2018   Procedure: BIOPSY;  Surgeon: Meryl Dare, MD;  Location: [facility] ENDOSCOPY;  Service: Endoscopy;;  . CHOLECYSTECTOMY  10/2010   complicated by bile leak  . COLONOSCOPY WITH PROPOFOL N/A 02/25/2018   Procedure: COLONOSCOPY WITH PROPOFOL;  Surgeon: Meryl Dare, MD;  Location: [facility] ENDOSCOPY;  Service: Endoscopy;  Laterality: N/A;  . ERCP  11/03/2010   for bile leak  . ESOPHAGOGASTRODUODENOSCOPY (EGD) WITH PROPOFOL N/A 02/25/2018   Procedure: ESOPHAGOGASTRODUODENOSCOPY (EGD) WITH PROPOFOL;  Surgeon: Meryl Dare, MD;  Location: [facility] ENDOSCOPY;  Service: Endoscopy;  Laterality: N/A;  . LAPAROSCOPIC TUBAL LIGATION    . TONSILLECTOMY       A IV Location/Drains/Wounds Patient Lines/Drains/Airways Status   Active  Line/Drains/Airways    Name:   Placement date:   Placement time:   Site:   Days:   Peripheral IV 11/18/18 Right Antecubital   11/18/18    -    Antecubital   1          Intake/Output Last 24 hours  Intake/Output Summary (Last 24 hours) at 11/19/2018 0039 Last data filed at 11/19/2018 0010 Gross per 24 hour  Intake 3500 ml  Output -  Net 3500 ml    Labs/Imaging Results for orders placed or  performed during the hospital encounter of 11/18/18 (from the past 48 hour(s))  CBC with Differential/Platelet     Status: Abnormal   Collection Time: 11/18/18 10:19 PM  Result Value Ref Range   WBC 8.8 4.0 - 10.5 K/uL   RBC 4.43 3.87 - 5.11 MIL/uL   Hemoglobin 13.4 12.0 - 15.0 g/dL   HCT 16.1 09.6 - 04.5 %   MCV 91.9 80.0 - 100.0 fL   MCH 30.2 26.0 - 34.0 pg   MCHC 32.9 30.0 - 36.0 g/dL   RDW 40.9 81.1 - 91.4 %   Platelets 106 (L) 150 - 400 K/uL    Comment: REPEATED TO VERIFY PLATELET COUNT CONFIRMED BY SMEAR SPECIMEN CHECKED FOR CLOTS Immature Platelet Fraction may be clinically indicated, consider ordering this additional test [id number]    nRBC 0.0 0.0 - 0.2 %   Neutrophils Relative % 58 %   Neutro Abs 5.2 1.7 - 7.7 K/uL   Lymphocytes Relative 27 %   Lymphs Abs 2.3 0.7 - 4.0 K/uL   Monocytes Relative 12 %   Monocytes Absolute 1.0 0.1 - 1.0 K/uL   Eosinophils Relative 2 %   Eosinophils Absolute 0.2 0.0 - 0.5 K/uL   Basophils Relative 0 %   Basophils Absolute 0.0 0.0 - 0.1 K/uL   Immature Granulocytes 1 %   Abs Immature Granulocytes 0.05 0.00 - 0.07 K/uL    Comment: Performed at [facility], [address] [place], [place] [address]  Basic metabolic panel     Status: Abnormal   Collection Time: 11/18/18 10:19 PM  Result Value Ref Range   Sodium 132 (L) 135 - 145 mmol/L   Potassium 3.5 3.5 - 5.1 mmol/L   Chloride 104 98 - 111 mmol/L   CO2 18 (L) 22 - 32 mmol/L   Glucose, Bld 120 (H) 70 - 99 mg/dL   BUN 15 8 - 23 mg/dL   Creatinine, Ser 7.84 (H) 0.44 - 1.00 mg/dL   Calcium 8.8 (L) 8.9 - 10.3 mg/dL   GFR calc non Af Amer 31 (L) >60 mL/min   GFR calc Af Amer 36 (L) >60 mL/min   Anion gap 10 5 - 15    Comment: Performed at [facility], [address] [place], [place] [address]  I-stat troponin, ED     Status: None   Collection Time: 11/18/18 10:29 PM  Result Value Ref Range   Troponin i, poc 0.01 0.00 - 0.08 ng/mL   Comment 3            Comment: Due  to the release kinetics of cTnI, a negative result within the first hours of the onset of symptoms does not rule out myocardial infarction with certainty. If myocardial infarction is still suspected, repeat the test at appropriate intervals.    Dg Chest 2 View  Result Date: 11/18/2018 CLINICAL DATA:  Syncope EXAM: CHEST - 2 VIEW COMPARISON:  09/22/2018, 09/21/2018, 02/24/2018, CT  chest 02/24/2018 FINDINGS: Large hiatal hernia. No acute opacity or pleural effusion. Partially obscured cardiomediastinal silhouette, likely enlarged. No pneumothorax IMPRESSION: No active cardiopulmonary disease.  Large hiatal hernia Electronically Signed   By: Jasmine Pang M.D.   On: 11/18/2018 22:55    Pending Labs Unresulted Labs (From admission, onward)    Start     Ordered   11/19/18 0500  Basic metabolic panel  Tomorrow morning,   R     11/19/18 0029   11/19/18 0500  CBC WITH DIFFERENTIAL  Tomorrow morning,   R     11/19/18 0029   11/19/18 0028  Urine culture  ONCE - STAT,   R     11/19/18 0029   11/19/18 0023  Lactic acid, plasma  Once,   R     11/19/18 0029   11/19/18 0023  Urinalysis, Routine w reflex microscopic  ONCE - STAT,   R     11/19/18 0029   11/19/18 0006  Sodium, urine, random  ONCE - STAT,   R     11/19/18 0007   11/19/18 0006  Creatinine, urine, random  ONCE - STAT,   R     11/19/18 0007   11/19/18 0005  Hepatic function panel  Once,   R     11/19/18 0007   11/19/18 0005  Vitamin B12  (Anemia Panel (PNL))  Once,   R     11/19/18 0007   11/19/18 0005  Folate  (Anemia Panel (PNL))  Once,   R     11/19/18 0007   11/19/18 0005  Iron and TIBC  (Anemia Panel (PNL))  Once,   R     11/19/18 0007   11/19/18 0005  Ferritin  (Anemia Panel (PNL))  Once,   R     11/19/18 0007   11/19/18 0005  Reticulocytes  (Anemia Panel (PNL))  Once,   R     11/19/18 0007   11/19/18 0005  TSH  Once,   R     11/19/18 0007          Vitals/Pain Today's Vitals   11/19/18 0000 11/19/18 0010 11/19/18  0020 11/19/18 0030  BP: (!) 90/54 (!) 94/50 111/64 105/66  Pulse: 78 78 92 93  Resp: (!) (!) 25  Temp:      TempSrc:      SpO2: 100% 96% 98% 96%  Weight:      Height:      PainSc:        Isolation Precautions No active isolations  Medications Medications  levothyroxine (SYNTHROID, LEVOTHROID) tablet 25 mcg (has no administration in time range)  pantoprazole (PROTONIX) EC tablet 40 mg (has no administration in time range)  sodium chloride flush (NS) 0.9 % injection 3 mL (has no administration in time range)  0.9 %  sodium chloride infusion (has no administration in time range)  acetaminophen (TYLENOL) tablet 650 mg (has no administration in time range)    Or  acetaminophen (TYLENOL) suppository 650 mg (has no administration in time range)  polyethylene glycol (MIRALAX / GLYCOLAX) packet 17 g (has no administration in time range)  ondansetron (ZOFRAN) tablet 4 mg (has no administration in time range)    Or  ondansetron (ZOFRAN) injection 4 mg (has no administration in time range)  sodium chloride 0.9 % bolus 500 mL ( Intravenous Stopped 11/18/18 2321)  sodium chloride 0.9 % bolus 500 mL (0 mLs Intravenous Stopped 11/18/18 2355)  sodium chloride 0.9 %  bolus 500 mL ( Intravenous Stopped 11/19/18 0008)  sodium chloride 0.9 % bolus 500 mL (500 mLs Intravenous New Bag/Given 11/19/18 0011)  sodium chloride 0.9 % bolus 1,000 mL (1,000 mLs Intravenous New Bag/Given 11/19/18 0035)    Mobility walks with device Low fall risk   Focused Assessments Cardiac Assessment Handoff:  Cardiac Rhythm: Normal sinus rhythm Lab Results  Component Value Date   CKTOTAL 41 11/02/2010   CKMB 1.0 11/02/2010   TROPONINI 0.01        NO INDICATION OF MYOCARDIAL INJURY. 11/02/2010   Lab Results  Component Value Date   DDIMER (H) 11/02/2010    3.95        AT THE INHOUSE ESTABLISHED CUTOFF VALUE OF 0.48 ug/mL FEU, THIS ASSAY HAS BEEN DOCUMENTED IN THE LITERATURE TO HAVE A SENSITIVITY AND  NEGATIVE PREDICTIVE VALUE OF AT LEAST 98 TO 99%.  THE TEST RESULT SHOULD BE CORRELATED WITH AN ASSESSMENT OF THE CLINICAL PROBABILITY OF DVT / VTE.   Does the Patient currently have chest pain? No     R Recommendations: See Admitting Provider Note  Report given to:   Additional Notes:  Pt reports that she hasn't drank anything today other than what she had at dinner tonight. Hx of dehydration, weakness.

## 2018-11-19 NOTE — Progress Notes (Signed)
Mrs and Mr Seacat given discharge instructions.  Discussed signs and symptoms to watch for and when to contact the physician.  Discussed follow up appointments and medications.  Verbalized understanding.

## 2018-11-19 NOTE — Plan of Care (Signed)
Patient is adequate for discharge.  

## 2018-11-19 NOTE — Discharge Summary (Signed)
Physician Discharge Summary  Sarah Miranda BMZ:[id number] DOB: 1946/05/11 DOA: 11/18/2018  PCP: Rodrigo Ran, MD  Admit date: 11/18/2018 Discharge date: 11/19/2018  Admitted From: Home Disposition:  Home  Recommendations for Outpatient Follow-up:  1. Follow up with PCP in 1 week 2. Stop lisinopril  3. Please obtain BMP in 1 week  4. Please follow-up on urine culture results, pending at time of discharge  Discharge Condition: Stable CODE STATUS: Full  Diet recommendation: Heart healthy   Brief/Interim Summary: Sarah Miranda is a 73 y.o. female with medical history significant for bipolar disorder, hypertension, hypothyroidism, and chronic pain, now presenting to the emergency department with hypotension after she was difficult to wake.  Patient reports that she had been in her usual state until yesterday evening when she became lightheaded while ambulating in her house.  She laid down on the couch, took a nap, and her husband attempted to wake her up approximately 1 hour later, but had difficulty doing so and called EMS.  Patient reports that she did not have much of an appetite for the past day and had not been eating or drinking much of anything.  She denies any chest pain, headache, change in vision or hearing, or focal numbness or weakness.  She denies any fevers, chills, cough, shortness of breath, dysuria, abdominal pain, neck stiffness, rash, or wounds.  She has not noted any bleeding.  She had been admitted to the hospital in January under similar circumstances, blood pressure and renal function improved with IV fluids, and antihypertensive (lisinopril) was held on discharge.  Patient reports that she saw her PCP in the interim, BP was back up, and she was restarted on antihypertensive.  EMS found the patient to have SBP in 70s and administered 500 cc normal saline prior to arrival in the ED.  Upon arrival to the ED, patient is found to be afebrile, saturating well on room air, slightly  tachycardic, and with blood pressure 77/49.  EKG features a sinus rhythm with chronic left bundle branch block.  Chemistry panel is notable for a creatinine of 1.62, up from 1.30 at time of hospital discharge in January.  CBC features a new thrombocytopenia with platelets 106,000.  Troponin is normal.  Chest x-ray is negative for acute cardiopulmonary disease.  Patient was given 1.5 L normal saline in the ED with improvement in her blood pressure.  She is alert and fully oriented in the emergency department, blood pressure is improving with IV fluid hydration, and she will be observed for ongoing evaluation and management.  Interim: Patient's blood pressure normalized after IV fluids.  Her creatinine trended back down to her baseline level.  On day of discharge, she was feeling well, back to her baseline self.  She had no complaints of chest pain or shortness of breath, no nausea, vomiting or diarrhea or abdominal pain.  She could not recall the name of the antihypertensive medication that her primary care physician resumed.  Unfortunately, [facility] was closed when I attempted to call to verify.  Patient believes the medication was called lisinopril.  I have advised patient to stop lisinopril.  Her blood pressure on discharge was 120/90.  Discharge Diagnoses:  Principal Problem:   Hypotension Active Problems:   Hypothyroidism   Bipolar disorder (HCC)   Essential hypertension   AKI (acute kidney injury) (HCC)   Thrombocytopenia (HCC)   Chronic pain   Discharge Instructions  Discharge Instructions    Call MD for:  difficulty breathing, headache or  visual disturbances   Complete by:  As directed    Call MD for:  extreme fatigue   Complete by:  As directed    Call MD for:  persistant dizziness or light-headedness   Complete by:  As directed    Diet - low sodium heart healthy   Complete by:  As directed    Discharge instructions   Complete by:  As directed    You were cared for by a  hospitalist during your hospital stay. If you have any questions about your discharge medications or the care you received while you were in the hospital after you are discharged, you can call the unit and ask to speak with the hospitalist on call if the hospitalist that took care of you is not available. Once you are discharged, your primary care physician will handle any further medical issues. Please note that NO REFILLS for any discharge medications will be authorized once you are discharged, as it is imperative that you return to your primary care physician (or establish a relationship with a primary care physician if you do not have one) for your aftercare needs so that they can reassess your need for medications and monitor your lab values.   Increase activity slowly   Complete by:  As directed      Allergies as of 11/19/2018      Reactions   Fetzima [levomilnacipran] Nausea Only      Medication List    TAKE these medications   amitriptyline 25 MG tablet Commonly known as:  ELAVIL TAKE 1 TABLET(25 MG) BY MOUTH AT BEDTIME What changed:  See the new instructions.   FLUoxetine 40 MG capsule Commonly known as:  PROZAC TAKE 1 CAPSULE BY MOUTH DAILY TO HELP NERVES AND DEPRESSION What changed:  See the new instructions.   levothyroxine 25 MCG tablet Commonly known as:  SYNTHROID, LEVOTHROID TAKE 1 TABLET BY MOUTH EVERY DAY What changed:  when to take this   multivitamin with minerals Tabs tablet Take 1 tablet by mouth daily.   ondansetron 4 MG disintegrating tablet Commonly known as:  ZOFRAN-ODT Take 4 mg by mouth every 8 (eight) hours as needed for nausea or vomiting.   oxyCODONE-acetaminophen 5-325 MG tablet Commonly known as:  PERCOCET/ROXICET Take 1 tablet by mouth every 4 (four) hours as needed for moderate pain or severe pain.   pantoprazole 40 MG tablet Commonly known as:  PROTONIX Take 1 tablet (40 mg total) by mouth 2 (two) times daily. What changed:  when to take  this   pregabalin 150 MG capsule Commonly known as:  Lyrica Take 1 capsule (150 mg total) by mouth daily.   risperiDONE 1 MG tablet Commonly known as:  RISPERDAL TAKE 1 TABLET BY MOUTH TWICE DAILY TO HELP RELAX What changed:  See the new instructions.   SYSTANE COMPLETE OP Apply 1-2 drops to eye daily as needed (dryness).   Tymlos 3120 MCG/1.56ML Sopn Generic drug:  Abaloparatide Inject 80 mcg into the skin every morning.      Follow-up Information    Rodrigo Ran, MD. Schedule an appointment as soon as possible for a visit in 1 week(s).   Specialty:  Internal Medicine Why:  Need repeat BMP  Contact information: [address] [place] [address] [phone]          Allergies  Allergen Reactions  . Fetzima [Levomilnacipran] Nausea Only    Consultations:  None    Procedures/Studies: Dg Chest 2 View  Result Date: 11/18/2018  CLINICAL DATA:  Syncope EXAM: CHEST - 2 VIEW COMPARISON:  09/22/2018, 09/21/2018, 02/24/2018, CT chest 02/24/2018 FINDINGS: Large hiatal hernia. No acute opacity or pleural effusion. Partially obscured cardiomediastinal silhouette, likely enlarged. No pneumothorax IMPRESSION: No active cardiopulmonary disease.  Large hiatal hernia Electronically Signed   By: Jasmine Pang M.D.   On: 11/18/2018 22:55      Discharge Exam: Vitals:   11/19/18 0409 11/19/18 0748  BP: 114/69 120/90  Pulse: 76 75  Resp: 19 18  Temp: (!) 97.5 F (36.4 C) 97.6 F (36.4 C)  SpO2: 100% 100%    General: Pt is alert, awake, not in acute distress Cardiovascular: RRR, S1/S2 +, no rubs, no gallops Respiratory: CTA bilaterally, no wheezing, no rhonchi Abdominal: Soft, NT, ND, bowel sounds + Extremities: no edema, no cyanosis    The results of significant diagnostics from this hospitalization (including imaging, microbiology, ancillary and laboratory) are listed below for reference.     Microbiology: No results found for this or any previous visit (from  the past 240 hour(s)).   Labs: BNP (last 3 results) Recent Labs    09/21/18 1549  BNP 34.3   Basic Metabolic Panel: Recent Labs  Lab 11/18/18 2219 11/19/18 0236  NA 132* 138  K 3.5 3.3*  CL 104 110  CO2 18* 20*  GLUCOSE 120* 112*  BUN 15 13  CREATININE 1.62* 1.26*  CALCIUM 8.8* 8.1*   Liver Function Tests: Recent Labs  Lab 11/19/18 0236  AST 14*  ALT 9  ALKPHOS 49  BILITOT 0.4  PROT 5.2*  ALBUMIN 3.2*   No results for input(s): LIPASE, AMYLASE in the last 168 hours. No results for input(s): AMMONIA in the last 168 hours. CBC: Recent Labs  Lab 11/18/18 2219 11/19/18 0236  WBC 8.8 6.8  NEUTROABS 5.2 4.0  HGB 13.4 12.0  HCT 40.7 39.1  MCV 91.9 94.2  PLT 106* 191   Cardiac Enzymes: No results for input(s): CKTOTAL, CKMB, CKMBINDEX, TROPONINI in the last 168 hours. BNP: Invalid input(s): POCBNP CBG: Recent Labs  Lab 11/19/18 0641  GLUCAP 70   D-Dimer No results for input(s): DDIMER in the last 72 hours. Hgb A1c No results for input(s): HGBA1C in the last 72 hours. Lipid Profile No results for input(s): CHOL, HDL, LDLCALC, TRIG, CHOLHDL, LDLDIRECT in the last 72 hours. Thyroid function studies Recent Labs    11/19/18 0236  TSH 1.259   Anemia work up Recent Labs    11/19/18 0236  VITAMINB12 324  FOLATE 22.7  FERRITIN 36  TIBC 246*  IRON 18*  RETICCTPCT 1.6   Urinalysis    Component Value Date/Time   COLORURINE YELLOW 09/21/2018 1640   APPEARANCEUR CLEAR 09/21/2018 1640   LABSPEC 1.008 09/21/2018 1640   PHURINE 5.0 09/21/2018 1640   GLUCOSEU NEGATIVE 09/21/2018 1640   HGBUR NEGATIVE 09/21/2018 1640   BILIRUBINUR NEGATIVE 09/21/2018 1640   KETONESUR NEGATIVE 09/21/2018 1640   PROTEINUR NEGATIVE 09/21/2018 1640   UROBILINOGEN 4.0 (H) 10/31/2010 1726   NITRITE NEGATIVE 09/21/2018 1640   LEUKOCYTESUR NEGATIVE 09/21/2018 1640   Sepsis Labs Invalid input(s): PROCALCITONIN,  WBC,  LACTICIDVEN Microbiology No results found for this  or any previous visit (from the past 240 hour(s)).   Patient was seen and examined on the day of discharge and was found to be in stable condition. Time coordinating discharge: 40 minutes including assessment and coordination of care, as well as examination of the patient.   SIGNED:  Noralee Stain, DO [facility] [email or web address]  11/19/2018, 1:13 PM

## 2018-11-19 NOTE — Plan of Care (Signed)
Care plans reviewed and patient is progressing.  

## 2018-11-19 NOTE — Plan of Care (Signed)
  Problem: Education: Goal: Knowledge of General Education information will improve Description: Including pain rating scale, medication(s)/side effects and non-pharmacologic comfort measures Outcome: Progressing   Problem: Pain Managment: Goal: General experience of comfort will improve Outcome: Progressing   Problem: Safety: Goal: Ability to remain free from injury will improve Outcome: Progressing   

## 2018-11-19 NOTE — H&P (Addendum)
History and Physical    Sarah CorinBrenda P Miranda WUJ:[id number] DOB: 02-21-1946 DOA: 11/18/2018  PCP: Sarah RanPerini, Mark, MD   Patient coming from: Home   Chief Complaint: Difficult to wake, hypotensive   HPI: Sarah Miranda is a 73 y.o. female with medical history significant for bipolar disorder, hypertension, hypothyroidism, and chronic pain, now presenting to the emergency department with hypotension after she was difficult to wake.  Patient reports that she had been in her usual state until yesterday evening when she became lightheaded while ambulating in her house.  She laid down on the couch, took a nap, and her husband attempted to wake her up approximately 1 hour later, but had difficulty doing so and called EMS.  Patient reports that she did not have much of an appetite for the past day and had not been eating or drinking much of anything.  She denies any chest pain, headache, change in vision or hearing, or focal numbness or weakness.  She denies any fevers, chills, cough, shortness of breath, dysuria, abdominal pain, neck stiffness, rash, or wounds.  She has not noted any bleeding.  She had been admitted to the hospital in January under similar circumstances, blood pressure and renal function improved with IV fluids, and antihypertensives were held on discharge.  Patient reports that she saw her PCP in the interim, BP was back up, and she was restarted on antihypertensive, though she is unable to give the name of it and it is not noted on her current medication list.  EMS found the patient to have SBP in 70s and administered 500 cc normal saline prior to arrival in the ED.  ED Course: Upon arrival to the ED, patient is found to be afebrile, saturating well on room air, slightly tachycardic, and with blood pressure 77/49.  EKG features a sinus rhythm with chronic left bundle branch block.  Chemistry panel is notable for a creatinine of 1.62, up from 1.30 at time of hospital discharge in January.  CBC  features a new thrombocytopenia with platelets 106,000.  Troponin is normal.  Chest x-ray is negative for acute cardiopulmonary disease.  Patient was given 1.5 L normal saline in the ED with improvement in her blood pressure.  She is alert and fully oriented in the emergency department, blood pressure is improving with IV fluid hydration, and she will be observed for ongoing evaluation and management.  Review of Systems:  All other systems reviewed and apart from HPI, are negative.  Past Medical History:  Diagnosis Date  . Abnormal involuntary movements(781.0)   . Alopecia, unspecified   . Anemia, unspecified   . Anxiety   . Bipolar disorder, unspecified (HCC)   . Depression   . Dysuria   . Edema   . Encounter for long-term (current) use of other medications   . Esophageal reflux   . Fibromyalgia   . Hyperthyroidism   . Insomnia   . Lumbago   . Nonspecific elevation of levels of transaminase or lactic acid dehydrogenase (LDH)   . Other abnormal blood chemistry    hyperglycemia  . Other malaise and fatigue   . Pathologic fracture of vertebrae   . Reflux esophagitis   . Syncope and collapse   . Tachycardia, unspecified   . Unspecified essential hypertension   . Unspecified hypothyroidism     Past Surgical History:  Procedure Laterality Date  . BIOPSY  02/25/2018   Procedure: BIOPSY;  Surgeon: Meryl DareStark, Malcolm T, MD;  [facility];  Service: Endoscopy;;  .  CHOLECYSTECTOMY  10/2010   complicated by bile leak  . COLONOSCOPY WITH PROPOFOL N/A 02/25/2018   Procedure: COLONOSCOPY WITH PROPOFOL;  Surgeon: Meryl Dare, MD;  Location: [facility] ENDOSCOPY;  Service: Endoscopy;  Laterality: N/A;  . ERCP  11/03/2010   for bile leak  . ESOPHAGOGASTRODUODENOSCOPY (EGD) WITH PROPOFOL N/A 02/25/2018   Procedure: ESOPHAGOGASTRODUODENOSCOPY (EGD) WITH PROPOFOL;  Surgeon: Meryl Dare, MD;  Location[facility] ENDOSCOPY;  Service: Endoscopy;  Laterality: N/A;  . LAPAROSCOPIC TUBAL LIGATION     . TONSILLECTOMY       reports that she has never smoked. She has never used smokeless tobacco. She reports that she does not drink alcohol or use drugs.  Allergies  Allergen Reactions  . Fetzima [Levomilnacipran] Nausea Only    Family History  Problem Relation Age of Onset  . Cancer Father        prostate  . Colon cancer Neg Hx      Prior to Admission medications   Medication Sig Start Date End Date Taking? Authorizing Provider  Abaloparatide (TYMLOS) 3120 MCG/1.56ML SOPN Inject 80 mcg into the skin every morning.   Yes [provider]  amitriptyline (ELAVIL) 25 MG tablet TAKE 1 TABLET(25 MG) BY MOUTH AT BEDTIME Patient taking differently: Take 25 mg by mouth at bedtime.  01/31/18  Yes Reed, Tiffany L, DO  FLUoxetine (PROZAC) 40 MG capsule TAKE 1 CAPSULE BY MOUTH DAILY TO HELP NERVES AND DEPRESSION Patient taking differently: Take 40 mg by mouth daily.  01/12/18  Yes Reed, Tiffany L, DO  levothyroxine (SYNTHROID, LEVOTHROID) 25 MCG tablet TAKE 1 TABLET BY MOUTH EVERY DAY Patient taking differently: Take 25 mcg by mouth daily before breakfast.  01/05/18  Yes Reed, Tiffany L, DO  Multiple Vitamin (MULTIVITAMIN WITH MINERALS) TABS tablet Take 1 tablet by mouth daily.   Yes [provider]  ondansetron (ZOFRAN-ODT) 4 MG disintegrating tablet Take 4 mg by mouth every 8 (eight) hours as needed for nausea or vomiting.  07/17/18  Yes [provider]  oxyCODONE-acetaminophen (PERCOCET/ROXICET) 5-325 MG tablet Take 1 tablet by mouth every 4 (four) hours as needed for moderate pain or severe pain. 04/22/18  Yes Rolly Salter, MD  pantoprazole (PROTONIX) 40 MG tablet Take 1 tablet (40 mg total) by mouth 2 (two) times daily. Patient taking differently: Take 40 mg by mouth every morning.  09/22/18  Yes Regalado, Belkys A, MD  pregabalin (LYRICA) 150 MG capsule Take 1 capsule (150 mg total) by mouth daily. 01/19/18  Yes Reed, Tiffany L, DO  Propylene Glycol (SYSTANE COMPLETE  OP) Apply 1-2 drops to eye daily as needed (dryness).   Yes [provider]  risperiDONE (RISPERDAL) 1 MG tablet TAKE 1 TABLET BY MOUTH TWICE DAILY TO HELP RELAX Patient taking differently: Take 1 mg by mouth at bedtime.  06/13/17  Yes Kermit Balo, DO    Physical Exam: Vitals:   11/18/18 2339 11/18/18 2340 11/18/18 2350 11/19/18 0000  BP: (!) 74/58 (!) 88/49 (!) 88/50 (!) 90/54  Pulse: 79 79 80 78  Resp: 18 19 15  (!) 24  Temp:      TempSrc:      SpO2: 99% 98% 96% 100%  Weight:      Height:        Constitutional: NAD, calm  Eyes: PERTLA, lids and conjunctivae normal ENMT: Mucous membranes are moist. Posterior pharynx clear of any exudate or lesions.   Neck: normal, supple, no masses, no thyromegaly Respiratory: clear to auscultation bilaterally,  no wheezing, no crackles. Normal respiratory effort.    Cardiovascular: S1 & S2 heard, regular rate and rhythm. No extremity edema.   Abdomen: No distension, no tenderness, soft. Bowel sounds active.  Musculoskeletal: no clubbing / cyanosis. No joint deformity upper and lower extremities.   Skin: no significant rashes, lesions, ulcers. Warm, dry, well-perfused. Neurologic: CN 2-12 grossly intact. Sensation intact. Strength 5/5 in all 4 limbs.  Psychiatric: Alert and oriented x 3. Pleasant, cooperative.    Labs on Admission: I have personally reviewed following labs and imaging studies  CBC: Recent Labs  Lab 11/18/18 2219  WBC 8.8  NEUTROABS 5.2  HGB 13.4  HCT 40.7  MCV 91.9  PLT 106*   Basic Metabolic Panel: Recent Labs  Lab 11/18/18 2219  NA 132*  K 3.5  CL 104  CO2 18*  GLUCOSE 120*  BUN 15  CREATININE 1.62*  CALCIUM 8.8*   GFR: Estimated Creatinine Clearance: 29.3 mL/min (A) (by C-G formula based on SCr of 1.62 mg/dL (H)). Liver Function Tests: No results for input(s): AST, ALT, ALKPHOS, BILITOT, PROT, ALBUMIN in the last 168 hours. No results for input(s): LIPASE, AMYLASE in the last 168 hours. No  results for input(s): AMMONIA in the last 168 hours. Coagulation Profile: No results for input(s): INR, PROTIME in the last 168 hours. Cardiac Enzymes: No results for input(s): CKTOTAL, CKMB, CKMBINDEX, TROPONINI in the last 168 hours. BNP (last 3 results) No results for input(s): PROBNP in the last 8760 hours. HbA1C: No results for input(s): HGBA1C in the last 72 hours. CBG: No results for input(s): GLUCAP in the last 168 hours. Lipid Profile: No results for input(s): CHOL, HDL, LDLCALC, TRIG, CHOLHDL, LDLDIRECT in the last 72 hours. Thyroid Function Tests: No results for input(s): TSH, T4TOTAL, FREET4, T3FREE, THYROIDAB in the last 72 hours. Anemia Panel: No results for input(s): VITAMINB12, FOLATE, FERRITIN, TIBC, IRON, RETICCTPCT in the last 72 hours. Urine analysis:    Component Value Date/Time   COLORURINE YELLOW 09/21/2018 1640   APPEARANCEUR CLEAR 09/21/2018 1640   LABSPEC 1.008 09/21/2018 1640   PHURINE 5.0 09/21/2018 1640   GLUCOSEU NEGATIVE 09/21/2018 1640   HGBUR NEGATIVE 09/21/2018 1640   BILIRUBINUR NEGATIVE 09/21/2018 1640   KETONESUR NEGATIVE 09/21/2018 1640   PROTEINUR NEGATIVE 09/21/2018 1640   UROBILINOGEN 4.0 (H) 10/31/2010 1726   NITRITE NEGATIVE 09/21/2018 1640   LEUKOCYTESUR NEGATIVE 09/21/2018 1640   Sepsis Labs: (procalcitonin:4,lacticidven:4) )No results found for this or any previous visit (from the past 240 hour(s)).   Radiological Exams on Admission: Dg Chest 2 View  Result Date: 11/18/2018 CLINICAL DATA:  Syncope EXAM: CHEST - 2 VIEW COMPARISON:  09/22/2018, 09/21/2018, 02/24/2018, CT chest 02/24/2018 FINDINGS: Large hiatal hernia. No acute opacity or pleural effusion. Partially obscured cardiomediastinal silhouette, likely enlarged. No pneumothorax IMPRESSION: No active cardiopulmonary disease.  Large hiatal hernia Electronically Signed   By: Jasmine Pang M.D.   On: 11/18/2018 22:55    EKG: Independently reviewed. Sinus rhythm,  chronic LBBB.   Assessment/Plan   1. Hypotension  - Presents with hypotensive after husband had difficulty waking her from a nap  - She is responding to IVF, denies any fever or infectious sxs, denies chest pain or headache, and is alert and fully oriented in ED  - She had similar presentation in January, responded to IVF, and lisinopril was held  - Echo from January 2020 with EF 50-55%, grade 1 diastolic dysfunction, and mild-moderate aortic insufficiency; she was referred to cardiology  - She reports  being started back on a BP med, possibly lisinopril, not on active med list  - Most likely, this is secondary to hypovolemia in setting of poor appetite, with medications likely contributing  - Continue IVF hydration, check lactate and UA, culture if febrile, avoid sedating and vasoactive medications    2. Renal insufficiency  - SCr is 1.62 on admission, up from 1.30 in January  - Possibly an acute prerenal azotemia in setting of hypotension  - Check UA and urine chemistries, renally-dose medications, avoid nephrotoxins, continue IVF hydration, and repeat chem panel in am    3. Thrombocytopenia  - Platelets 106k on admission, previously normal  - Other CBC indices are normal and there is no bleeding  - Unclear etiology, no infectious s/s - Check smear, LFT's, B12 and folate, HIV, Hep C, TSH, and repeat CBC    4. Hypothyroidism  - Check TSH, continue Synthroid   5. Chronic pain  - No pain complaints on admission  - Holding sedating medications   6. Bipolar disorder  - Stable  - Sedating and vasoactive medications held on admission    DVT prophylaxis: SCD's  Code Status: Full  Family Communication: Husband updated at bedside  Consults called: None Admission status: Observation     Briscoe Deutscher, MD [facility] Pager [phone]  If 7PM-7AM, please contact night-coverage [email or web address] Password [email or web address]  11/19/2018, 12:29 AM

## 2018-11-19 NOTE — Progress Notes (Signed)
Patient admitted from Ed with Hypotension. Alert and oriented to room and surroundings. VSS. Telemetry applied and verified times two staff. Skin assessments done. Complaining of back pain with prior HX of chronic back pain. On call provider notified.

## 2018-11-20 LAB — URINE CULTURE: Culture: <10000 — AB

## 2018-11-20 LAB — HEPATITIS PANEL, ACUTE
Hep A IgM: NEGATIVE
Hep B C IgM: NEGATIVE
Hepatitis B Surface Ag: NEGATIVE

## 2019-01-22 ENCOUNTER — Ambulatory Visit: Payer: Self-pay

## 2019-07-03 ENCOUNTER — Other Ambulatory Visit: Payer: Self-pay | Admitting: Internal Medicine

## 2019-07-03 DIAGNOSIS — Z1231 Encounter for screening mammogram for malignant neoplasm of breast: Secondary | ICD-10-CM

## 2019-08-21 ENCOUNTER — Ambulatory Visit: Payer: Medicare Other

## 2019-09-12 ENCOUNTER — Other Ambulatory Visit: Payer: Self-pay

## 2019-09-12 ENCOUNTER — Encounter (HOSPITAL_COMMUNITY): Payer: Self-pay | Admitting: Emergency Medicine

## 2019-09-12 ENCOUNTER — Emergency Department (HOSPITAL_COMMUNITY): Payer: Medicare Other

## 2019-09-12 ENCOUNTER — Emergency Department (HOSPITAL_COMMUNITY)
Admission: EM | Admit: 2019-09-12 | Discharge: 2019-09-12 | Disposition: A | Discharge status: Home / Self Care | Payer: Medicare Other | Attending: Emergency Medicine | Admitting: Emergency Medicine

## 2019-09-12 DIAGNOSIS — Y999 Unspecified external cause status: Secondary | ICD-10-CM | POA: Insufficient documentation

## 2019-09-12 DIAGNOSIS — E860 Dehydration: Secondary | ICD-10-CM | POA: Diagnosis not present

## 2019-09-12 DIAGNOSIS — R197 Diarrhea, unspecified: Secondary | ICD-10-CM | POA: Insufficient documentation

## 2019-09-12 DIAGNOSIS — Y939 Activity, unspecified: Secondary | ICD-10-CM | POA: Diagnosis not present

## 2019-09-12 DIAGNOSIS — Z79899 Other long term (current) drug therapy: Secondary | ICD-10-CM | POA: Diagnosis not present

## 2019-09-12 DIAGNOSIS — X58XXXA Exposure to other specified factors, initial encounter: Secondary | ICD-10-CM | POA: Insufficient documentation

## 2019-09-12 DIAGNOSIS — I10 Essential (primary) hypertension: Secondary | ICD-10-CM | POA: Insufficient documentation

## 2019-09-12 DIAGNOSIS — E039 Hypothyroidism, unspecified: Secondary | ICD-10-CM | POA: Diagnosis not present

## 2019-09-12 DIAGNOSIS — Y92002 Bathroom of unspecified non-institutional (private) residence single-family (private) house as the place of occurrence of the external cause: Secondary | ICD-10-CM | POA: Insufficient documentation

## 2019-09-12 DIAGNOSIS — R55 Syncope and collapse: Secondary | ICD-10-CM | POA: Diagnosis not present

## 2019-09-12 DIAGNOSIS — S42294A Other nondisplaced fracture of upper end of right humerus, initial encounter for closed fracture: Secondary | ICD-10-CM | POA: Diagnosis not present

## 2019-09-12 DIAGNOSIS — S4991XA Unspecified injury of right shoulder and upper arm, initial encounter: Secondary | ICD-10-CM | POA: Diagnosis present

## 2019-09-12 LAB — COMPREHENSIVE METABOLIC PANEL
ALT: 15 U/L (ref 0–44)
AST: 21 U/L (ref 15–41)
Albumin: 3.3 g/dL — ABNORMAL LOW (ref 3.5–5.0)
Alkaline Phosphatase: 49 U/L (ref 38–126)
Anion gap: 11 (ref 5–15)
BUN: 26 mg/dL — ABNORMAL HIGH (ref 8–23)
CO2: 22 mmol/L (ref 22–32)
Calcium: 8.9 mg/dL (ref 8.9–10.3)
Chloride: 105 mmol/L (ref 98–111)
Creatinine, Ser: 1.29 mg/dL — ABNORMAL HIGH (ref 0.44–1.00)
GFR calc Af Amer: 47 mL/min — ABNORMAL LOW (ref >60)
GFR calc non Af Amer: 41 mL/min — ABNORMAL LOW (ref >60)
Glucose, Bld: 103 mg/dL — ABNORMAL HIGH (ref 70–99)
Potassium: 3.7 mmol/L (ref 3.5–5.1)
Sodium: 138 mmol/L (ref 135–145)
Total Bilirubin: 0.5 mg/dL (ref 0.3–1.2)
Total Protein: 5.7 g/dL — ABNORMAL LOW (ref 6.5–8.1)

## 2019-09-12 LAB — CBC WITH DIFFERENTIAL/PLATELET
Abs Immature Granulocytes: 0.05 10*3/uL (ref 0.00–0.07)
Basophils Absolute: 0.1 10*3/uL (ref 0.0–0.1)
Basophils Relative: 1 %
Eosinophils Absolute: 0.2 10*3/uL (ref 0.0–0.5)
Eosinophils Relative: 2 %
HCT: 46.1 % — ABNORMAL HIGH (ref 36.0–46.0)
Hemoglobin: 14.3 g/dL (ref 12.0–15.0)
Immature Granulocytes: 0 %
Lymphocytes Relative: 14 %
Lymphs Abs: 1.6 10*3/uL (ref 0.7–4.0)
MCH: 30.8 pg (ref 26.0–34.0)
MCHC: 31 g/dL (ref 30.0–36.0)
MCV: 99.1 fL (ref 80.0–100.0)
Monocytes Absolute: 0.9 10*3/uL (ref 0.1–1.0)
Monocytes Relative: 8 %
Neutro Abs: 9.3 10*3/uL — ABNORMAL HIGH (ref 1.7–7.7)
Neutrophils Relative %: 75 %
Platelets: 216 10*3/uL (ref 150–400)
RBC: 4.65 MIL/uL (ref 3.87–5.11)
RDW: 13 % (ref 11.5–15.5)
WBC: 12.2 10*3/uL — ABNORMAL HIGH (ref 4.0–10.5)
nRBC: 0 % (ref 0.0–0.2)

## 2019-09-12 LAB — TROPONIN I (HIGH SENSITIVITY): Troponin I (High Sensitivity): 11 ng/L (ref <18)

## 2019-09-12 LAB — CBG MONITORING, ED: Glucose-Capillary: 122 mg/dL — ABNORMAL HIGH (ref 70–99)

## 2019-09-12 MED ORDER — SODIUM CHLORIDE 0.9 % IV BOLUS
500.0000 mL | Freq: Once | INTRAVENOUS | Status: AC
  Administered 2019-09-12: 500 mL via INTRAVENOUS

## 2019-09-12 MED ORDER — FENTANYL CITRATE (PF) 100 MCG/2ML IJ SOLN
50.0000 ug | Freq: Once | INTRAMUSCULAR | Status: AC
  Administered 2019-09-12: 50 ug via INTRAVENOUS
  Filled 2019-09-12: qty 2

## 2019-09-12 MED ORDER — ACETAMINOPHEN 500 MG PO TABS
500.0000 mg | ORAL_TABLET | Freq: Once | ORAL | Status: AC
  Administered 2019-09-12: 500 mg via ORAL
  Filled 2019-09-12: qty 1

## 2019-09-12 NOTE — Discharge Instructions (Addendum)
Follow-up with orthopedic surgery for the fracture.  Try and keep yourself hydrated with the diarrhea.  Take your medicines as needed for pain.

## 2019-09-12 NOTE — ED Notes (Signed)
Ortho paged for shoulder immobilizer 

## 2019-09-12 NOTE — ED Notes (Signed)
Discharge instructions and follow-up care with ortho given to patient. Patient verbalized understanding, no questions regarding diagnosis. Pt to go home with spouse

## 2019-09-12 NOTE — ED Notes (Signed)
Pt stands for Ortho VS on own ability. No concerns or complaints. 

## 2019-09-12 NOTE — ED Provider Notes (Signed)
[facility] EMERGENCY DEPARTMENT Provider Note   CSN: [id number] Arrival date & time: 09/12/19  1618     History Chief Complaint  Patient presents with  . Loss of Consciousness  . Fall    Sarah Miranda is a 74 y.o. female.  HPI Patient presents after syncopal episode.  States she has been having some diarrhea.  Was walking to the bathroom when she had tunnel vision and feeling lightheaded.  Reportedly felt dizzy and tried to sit down patient did not think she passed out but states she did wake up on the floor with pain on her right arm.  Reportedly been soiled with feces.  Pain right mid arm.  No chest pain.  Does not think she hit her head.  She is not on anticoagulation.  No neck pain.  No numbness or weakness.  No nausea or vomiting.  No confusion.    Past Medical History:  Diagnosis Date  . Abnormal involuntary movements(781.0)   . Alopecia, unspecified   . Anemia, unspecified   . Anxiety   . Bipolar disorder, unspecified (HCC)   . Depression   . Dysuria   . Edema   . Encounter for long-term (current) use of other medications   . Esophageal reflux   . Fibromyalgia   . Hyperthyroidism   . Insomnia   . Lumbago   . Nonspecific elevation of levels of transaminase or lactic acid dehydrogenase (LDH)   . Other abnormal blood chemistry    hyperglycemia  . Other malaise and fatigue   . Pathologic fracture of vertebrae   . Reflux esophagitis   . Syncope and collapse   . Tachycardia, unspecified   . Unspecified essential hypertension   . Unspecified hypothyroidism     Patient Active Problem List   Diagnosis Date Noted  . Thrombocytopenia (HCC) 11/19/2018  . Chronic pain 11/19/2018  . AKI (acute kidney injury) (HCC) 09/21/2018  . Hypotension 09/21/2018  . Acute back pain 04/18/2018  . Iron deficiency anemia   . Gastroesophageal reflux disease with esophagitis   . Hematochezia 02/24/2018  . Compression fracture of T7 vertebra (HCC) 02/24/2018  .  Tachycardia 02/24/2018  . Uncomplicated opioid dependence (HCC) 09/25/2017  . Symptomatic anemia 05/20/2017  . Nausea without vomiting 06/23/2016  . Corn 06/23/2016  . Callus 06/02/2014  . Depression   . Insomnia   . Fibromyalgia   . Anxiety   . Hypothyroidism   . Hyperglycemia   . Malaise   . Bipolar disorder (HCC)   . Edema   . Essential hypertension     Past Surgical History:  Procedure Laterality Date  . BIOPSY  02/25/2018   Procedure: BIOPSY;  Surgeon: Meryl Dare, MD;  Location: [facility] ENDOSCOPY;  Service: Endoscopy;;  . CHOLECYSTECTOMY  10/2010   complicated by bile leak  . COLONOSCOPY WITH PROPOFOL N/A 02/25/2018   Procedure: COLONOSCOPY WITH PROPOFOL;  Surgeon: Meryl Dare, MD;  Location: [facility] ENDOSCOPY;  Service: Endoscopy;  Laterality: N/A;  . ERCP  11/03/2010   for bile leak  . ESOPHAGOGASTRODUODENOSCOPY (EGD) WITH PROPOFOL N/A 02/25/2018   Procedure: ESOPHAGOGASTRODUODENOSCOPY (EGD) WITH PROPOFOL;  Surgeon: Meryl Dare, MD;  Location: [facility] ENDOSCOPY;  Service: Endoscopy;  Laterality: N/A;  . LAPAROSCOPIC TUBAL LIGATION    . TONSILLECTOMY       OB History   No obstetric history on file.     Family History  Problem Relation Age of Onset  . Cancer Father  prostate  . Colon cancer Neg Hx     Social History   Tobacco Use  . Smoking status: Never Smoker  . Smokeless tobacco: Never Used  Substance Use Topics  . Alcohol use: No    Alcohol/week: 0.0 standard drinks  . Drug use: No    Home Medications Prior to Admission medications   Medication Sig Start Date End Date Taking? Authorizing Provider  Abaloparatide (TYMLOS) 3120 MCG/1.56ML SOPN Inject 80 mcg into the skin every morning.    [provider]  amitriptyline (ELAVIL) 25 MG tablet TAKE 1 TABLET(25 MG) BY MOUTH AT BEDTIME Patient taking differently: Take 25 mg by mouth at bedtime.  01/31/18   Reed, Tiffany L, DO  FLUoxetine (PROZAC) 40 MG capsule TAKE 1 CAPSULE BY MOUTH DAILY TO  HELP NERVES AND DEPRESSION Patient taking differently: Take 40 mg by mouth daily.  01/12/18   Reed, Tiffany L, DO  levothyroxine (SYNTHROID, LEVOTHROID) 25 MCG tablet TAKE 1 TABLET BY MOUTH EVERY DAY Patient taking differently: Take 25 mcg by mouth daily before breakfast.  01/05/18   Reed, Tiffany L, DO  Multiple Vitamin (MULTIVITAMIN WITH MINERALS) TABS tablet Take 1 tablet by mouth daily.    [provider]  ondansetron (ZOFRAN-ODT) 4 MG disintegrating tablet Take 4 mg by mouth every 8 (eight) hours as needed for nausea or vomiting.  07/17/18   [provider]  oxyCODONE-acetaminophen (PERCOCET/ROXICET) 5-325 MG tablet Take 1 tablet by mouth every 4 (four) hours as needed for moderate pain or severe pain. 04/22/18   Rolly Salter, MD  pantoprazole (PROTONIX) 40 MG tablet Take 1 tablet (40 mg total) by mouth 2 (two) times daily. Patient taking differently: Take 40 mg by mouth every morning.  09/22/18   Regalado, Belkys A, MD  pregabalin (LYRICA) 150 MG capsule Take 1 capsule (150 mg total) by mouth daily. 01/19/18   Reed, Tiffany L, DO  Propylene Glycol (SYSTANE COMPLETE OP) Apply 1-2 drops to eye daily as needed (dryness).    [provider]  risperiDONE (RISPERDAL) 1 MG tablet TAKE 1 TABLET BY MOUTH TWICE DAILY TO HELP RELAX Patient taking differently: Take 1 mg by mouth at bedtime.  06/13/17   Reed, Tiffany L, DO    Allergies    Fetzima [levomilnacipran]  Review of Systems   Review of Systems  Constitutional: Negative for appetite change.  HENT: Negative for congestion.   Respiratory: Negative for shortness of breath.   Cardiovascular: Negative for chest pain.  Gastrointestinal: Positive for diarrhea. Negative for abdominal pain.  Genitourinary: Negative for flank pain.  Musculoskeletal: Negative for back pain.  Skin: Negative for rash.  Neurological: Positive for syncope and light-headedness.  Psychiatric/Behavioral: Negative for confusion.    Physical  Exam Updated Vital Signs BP 139/79 (BP Location: Left Arm)   Pulse 84   Temp (!) 97.5 F (36.4 C) (Oral)   Resp 18   SpO2 99%   Physical Exam Vitals and nursing note reviewed.  HENT:     Head: Atraumatic.  Eyes:     Extraocular Movements: Extraocular movements intact.     Pupils: Pupils are equal, round, and reactive to light.  Cardiovascular:     Rate and Rhythm: Regular rhythm.  Pulmonary:     Breath sounds: No wheezing, rhonchi or rales.  Abdominal:     Tenderness: There is no abdominal tenderness.     Hernia: No hernia is present.  Musculoskeletal:     Cervical back: Neck supple.  Right lower leg: No edema.     Left lower leg: No edema.     Comments: Tenderness left mid humerus.  No clear tenderness over shoulder or elbow.  Neurovascular intact in right hand.  Right upper extremity held against body in a sling.  No cervical spine tenderness.  Skin:    Capillary Refill: Capillary refill takes less than 2 seconds.  Neurological:     Mental Status: She is alert and oriented to person, place, and time.     ED Results / Procedures / Treatments   Labs (all labs ordered are listed, but only abnormal results are displayed) Labs Reviewed  CBC WITH DIFFERENTIAL/PLATELET - Abnormal; Notable for the following components:      Result Value   WBC 12.2 (*)    HCT 46.1 (*)    Neutro Abs 9.3 (*)    All other components within normal limits  COMPREHENSIVE METABOLIC PANEL - Abnormal; Notable for the following components:   Glucose, Bld 103 (*)    BUN 26 (*)    Creatinine, Ser 1.29 (*)    Total Protein 5.7 (*)    Albumin 3.3 (*)    GFR calc non Af Amer 41 (*)    GFR calc Af Amer 47 (*)    All other components within normal limits  CBG MONITORING, ED - Abnormal; Notable for the following components:   Glucose-Capillary 122 (*)    All other components within normal limits  TROPONIN I (HIGH SENSITIVITY)  TROPONIN I (HIGH SENSITIVITY)    EKG EKG  Interpretation  Date/Time:  Wednesday September 12 2019 16:20:17 EST Ventricular Rate:  96 PR Interval:    QRS Duration: 165 QT Interval:  377 QTC Calculation: 477 R Axis:   -53 Text Interpretation: Sinus rhythm Right bundle branch block LVH with secondary repolarization abnormality Confirmed by Davonna Belling [phone]) on 09/12/2019 4:49:05 PM   Radiology DG Humerus Right  Result Date: 09/12/2019 CLINICAL DATA:  Status post fall EXAM: RIGHT HUMERUS - 2+ VIEW COMPARISON:  None. FINDINGS: An acute nondisplaced fracture deformity is seen involving the neck of the proximal right humerus. There is no evidence of dislocation. No lytic or blastic lesions are identified. Soft tissues are unremarkable. IMPRESSION: 1. Nondisplaced fracture of the proximal right humerus. Electronically Signed   By: Virgina Norfolk M.D.   On: 09/12/2019 17:26    Procedures Procedures (including critical care time)  Medications Ordered in ED Medications  fentaNYL (SUBLIMAZE) injection 50 mcg (50 mcg Intravenous Given 09/12/19 1729)  sodium chloride 0.9 % bolus 500 mL (0 mLs Intravenous Stopped 09/12/19 2240)  acetaminophen (TYLENOL) tablet 500 mg (500 mg Oral Given 09/12/19 2123)    ED Course  I have reviewed the triage vital signs and the nursing notes.  Pertinent labs & imaging results that were available during my care of the patient were reviewed by me and considered in my medical decision making (see chart for details).    MDM Rules/Calculators/A&P                      Patient with syncope.  I think likely related to dehydration secondary her diarrhea.  Feels better after some IV fluid.  Unfortunately she did have a proximal humerus fracture.  Sling/immobilizer given.  Will discharge home with outpatient follow-up  Final Clinical Impression(s) / ED Diagnoses Final diagnoses:  Dehydration  Diarrhea, unspecified type  Syncope and collapse  Other closed nondisplaced fracture of proximal end of right humerus,  initial encounter    Rx / DC Orders ED Discharge Orders    None       Benjiman Core, MD 09/12/19 2321

## 2019-09-12 NOTE — ED Notes (Signed)
Patient transported to X-ray 

## 2019-09-12 NOTE — ED Triage Notes (Signed)
Pt arrives to ED from home with complaints of a fall this afternoon hurting her right shoulder. Patient stated she been having diarhhrea today and has been feeling weak. Patient stated she got tunnel vision while walking with a walker to the bathroom and suddenly everything went black and she passed out. On arrival to ED patient was soiled in feces and complains of 10/10 right shoulder pain.

## 2019-09-25 ENCOUNTER — Emergency Department (HOSPITAL_COMMUNITY)
Admission: EM | Admit: 2019-09-25 | Discharge: 2019-09-28 | Disposition: A | Discharge status: Other Acute Inpatient Hospital | Payer: Medicare Other | Attending: Emergency Medicine | Admitting: Emergency Medicine

## 2019-09-25 ENCOUNTER — Other Ambulatory Visit: Payer: Self-pay

## 2019-09-25 ENCOUNTER — Emergency Department (HOSPITAL_COMMUNITY): Payer: Medicare Other

## 2019-09-25 ENCOUNTER — Encounter (HOSPITAL_COMMUNITY): Payer: Self-pay

## 2019-09-25 DIAGNOSIS — F11920 Opioid use, unspecified with intoxication, uncomplicated: Secondary | ICD-10-CM

## 2019-09-25 DIAGNOSIS — R531 Weakness: Secondary | ICD-10-CM

## 2019-09-25 DIAGNOSIS — Z79899 Other long term (current) drug therapy: Secondary | ICD-10-CM | POA: Diagnosis not present

## 2019-09-25 DIAGNOSIS — R627 Adult failure to thrive: Secondary | ICD-10-CM | POA: Insufficient documentation

## 2019-09-25 DIAGNOSIS — R059 Cough, unspecified: Secondary | ICD-10-CM

## 2019-09-25 DIAGNOSIS — F11120 Opioid abuse with intoxication, uncomplicated: Secondary | ICD-10-CM | POA: Diagnosis not present

## 2019-09-25 DIAGNOSIS — Z20822 Contact with and (suspected) exposure to covid-19: Secondary | ICD-10-CM | POA: Diagnosis not present

## 2019-09-25 DIAGNOSIS — I10 Essential (primary) hypertension: Secondary | ICD-10-CM | POA: Diagnosis not present

## 2019-09-25 DIAGNOSIS — E079 Disorder of thyroid, unspecified: Secondary | ICD-10-CM | POA: Insufficient documentation

## 2019-09-25 DIAGNOSIS — G8921 Chronic pain due to trauma: Secondary | ICD-10-CM | POA: Diagnosis not present

## 2019-09-25 DIAGNOSIS — R05 Cough: Secondary | ICD-10-CM

## 2019-09-25 DIAGNOSIS — R6251 Failure to thrive (child): Secondary | ICD-10-CM

## 2019-09-25 LAB — URINALYSIS, ROUTINE W REFLEX MICROSCOPIC
Bilirubin Urine: NEGATIVE
Glucose, UA: NEGATIVE mg/dL
Hgb urine dipstick: NEGATIVE
Ketones, ur: NEGATIVE mg/dL
Nitrite: NEGATIVE
Protein, ur: NEGATIVE mg/dL
Specific Gravity, Urine: 1.011 (ref 1.005–1.030)
pH: 5 (ref 5.0–8.0)

## 2019-09-25 LAB — COMPREHENSIVE METABOLIC PANEL
ALT: 12 U/L (ref 0–44)
AST: 17 U/L (ref 15–41)
Albumin: 2.9 g/dL — ABNORMAL LOW (ref 3.5–5.0)
Alkaline Phosphatase: 61 U/L (ref 38–126)
Anion gap: 9 (ref 5–15)
BUN: 24 mg/dL — ABNORMAL HIGH (ref 8–23)
CO2: 25 mmol/L (ref 22–32)
Calcium: 8.6 mg/dL — ABNORMAL LOW (ref 8.9–10.3)
Chloride: 100 mmol/L (ref 98–111)
Creatinine, Ser: 1.43 mg/dL — ABNORMAL HIGH (ref 0.44–1.00)
GFR calc Af Amer: 42 mL/min — ABNORMAL LOW (ref >60)
GFR calc non Af Amer: 36 mL/min — ABNORMAL LOW (ref >60)
Glucose, Bld: 100 mg/dL — ABNORMAL HIGH (ref 70–99)
Potassium: 4.1 mmol/L (ref 3.5–5.1)
Sodium: 134 mmol/L — ABNORMAL LOW (ref 135–145)
Total Bilirubin: 0.7 mg/dL (ref 0.3–1.2)
Total Protein: 5.3 g/dL — ABNORMAL LOW (ref 6.5–8.1)

## 2019-09-25 LAB — CBC WITH DIFFERENTIAL/PLATELET
Abs Immature Granulocytes: 0.03 10*3/uL (ref 0.00–0.07)
Basophils Absolute: 0 10*3/uL (ref 0.0–0.1)
Basophils Relative: 0 %
Eosinophils Absolute: 0.3 10*3/uL (ref 0.0–0.5)
Eosinophils Relative: 4 %
HCT: 37.6 % (ref 36.0–46.0)
Hemoglobin: 11.6 g/dL — ABNORMAL LOW (ref 12.0–15.0)
Immature Granulocytes: 0 %
Lymphocytes Relative: 25 %
Lymphs Abs: 2.1 10*3/uL (ref 0.7–4.0)
MCH: 30.9 pg (ref 26.0–34.0)
MCHC: 30.9 g/dL (ref 30.0–36.0)
MCV: 100 fL (ref 80.0–100.0)
Monocytes Absolute: 0.8 10*3/uL (ref 0.1–1.0)
Monocytes Relative: 9 %
Neutro Abs: 5 10*3/uL (ref 1.7–7.7)
Neutrophils Relative %: 62 %
Platelets: 209 10*3/uL (ref 150–400)
RBC: 3.76 MIL/uL — ABNORMAL LOW (ref 3.87–5.11)
RDW: 13.2 % (ref 11.5–15.5)
WBC: 8.1 10*3/uL (ref 4.0–10.5)
nRBC: 0 % (ref 0.0–0.2)

## 2019-09-25 LAB — SALICYLATE LEVEL: Salicylate Lvl: <7 mg/dL — ABNORMAL LOW (ref 7.0–30.0)

## 2019-09-25 LAB — RESPIRATORY PANEL BY RT PCR (FLU A&B, COVID)
Influenza A by PCR: NEGATIVE
Influenza B by PCR: NEGATIVE
SARS Coronavirus 2 by RT PCR: NEGATIVE

## 2019-09-25 LAB — RAPID URINE DRUG SCREEN, HOSP PERFORMED
Amphetamines: NOT DETECTED
Barbiturates: NOT DETECTED
Benzodiazepines: NOT DETECTED
Cocaine: NOT DETECTED
Opiates: POSITIVE — AB
Tetrahydrocannabinol: NOT DETECTED

## 2019-09-25 LAB — ACETAMINOPHEN LEVEL: Acetaminophen (Tylenol), Serum: <10 ug/mL — ABNORMAL LOW (ref 10–30)

## 2019-09-25 LAB — ETHANOL: Alcohol, Ethyl (B): <10 mg/dL (ref <10)

## 2019-09-25 LAB — AMMONIA: Ammonia: 21 umol/L (ref 9–35)

## 2019-09-25 MED ORDER — AMITRIPTYLINE HCL 25 MG PO TABS
25.0000 mg | ORAL_TABLET | Freq: Every day | ORAL | Status: DC
  Administered 2019-09-25 – 2019-09-27 (×3): 25 mg via ORAL
  Filled 2019-09-25 (×3): qty 1

## 2019-09-25 MED ORDER — FLUOXETINE HCL 20 MG PO CAPS
40.0000 mg | ORAL_CAPSULE | Freq: Every day | ORAL | Status: DC
  Administered 2019-09-26 – 2019-09-28 (×3): 40 mg via ORAL
  Filled 2019-09-25 (×3): qty 2

## 2019-09-25 MED ORDER — ABALOPARATIDE 3120 MCG/1.56ML ~~LOC~~ SOPN: 80.0000 ug | PEN_INJECTOR | SUBCUTANEOUS | Status: DC

## 2019-09-25 MED ORDER — ADULT MULTIVITAMIN W/MINERALS CH
1.0000 | ORAL_TABLET | Freq: Every day | ORAL | Status: DC
  Administered 2019-09-26 – 2019-09-28 (×3): 1 via ORAL
  Filled 2019-09-25 (×4): qty 1

## 2019-09-25 MED ORDER — PANTOPRAZOLE SODIUM 40 MG PO TBEC
40.0000 mg | DELAYED_RELEASE_TABLET | ORAL | Status: DC
  Administered 2019-09-26 – 2019-09-28 (×3): 40 mg via ORAL
  Filled 2019-09-25 (×3): qty 1

## 2019-09-25 MED ORDER — OXYCODONE-ACETAMINOPHEN 5-325 MG PO TABS
1.0000 | ORAL_TABLET | Freq: Four times a day (QID) | ORAL | Status: DC | PRN
  Administered 2019-09-25 – 2019-09-28 (×7): 1 via ORAL
  Filled 2019-09-25 (×9): qty 1

## 2019-09-25 MED ORDER — LACTATED RINGERS IV BOLUS
1000.0000 mL | Freq: Once | INTRAVENOUS | Status: AC
  Administered 2019-09-25: 1000 mL via INTRAVENOUS

## 2019-09-25 MED ORDER — RISPERIDONE 1 MG PO TABS
1.0000 mg | ORAL_TABLET | Freq: Every day | ORAL | Status: DC
  Administered 2019-09-26 – 2019-09-27 (×2): 1 mg via ORAL
  Filled 2019-09-25 (×4): qty 1

## 2019-09-25 MED ORDER — LEVOTHYROXINE SODIUM 25 MCG PO TABS
25.0000 ug | ORAL_TABLET | Freq: Every day | ORAL | Status: DC
  Administered 2019-09-26 – 2019-09-28 (×3): 25 ug via ORAL
  Filled 2019-09-25 (×3): qty 1

## 2019-09-25 MED ORDER — PREGABALIN 50 MG PO CAPS
150.0000 mg | ORAL_CAPSULE | Freq: Every day | ORAL | Status: DC
  Administered 2019-09-26 – 2019-09-28 (×3): 150 mg via ORAL
  Filled 2019-09-25 (×3): qty 3

## 2019-09-25 NOTE — ED Notes (Signed)
PT paged earlier and voicemail just left per the RNs request

## 2019-09-25 NOTE — ED Notes (Signed)
Pt lying in bed, no acute distress. Pt states that she takes her oxy every 4-6 hours at home and I informed her that no oxy was ordered for her because there was question of if she was taking too much. Pt was agreeable.

## 2019-09-25 NOTE — ED Triage Notes (Signed)
Pt BIB [facility] for eval of generalized lethargy and increased muscle twitching/spasm. EMS reports pt recently broke collarbone about 2 weeks ago, and was prescribed oxy. EMS states that there are 18 more pills missing than should be from her oxy bottle. EMS also reports they found the husbands empty oxy bottle next to the patient on the couch. Husband reports that he doesn't believe she took more than prescribed, however EMS found several oxy bottles missing more than should have been if taken as prescribed. Husband reports general failure to thrive sx, supposed to go to rehab today for collarbone, but unable to go. States that she has not eaten, or walked or moved lately d/t pain from collarbone.

## 2019-09-25 NOTE — ED Notes (Signed)
Anisah Kuck husband [phone] looking for an update on the patient

## 2019-09-25 NOTE — ED Provider Notes (Signed)
[facility] EMERGENCY DEPARTMENT Provider Note   CSN: [id number] Arrival date & time: 09/25/19  1453     History Chief Complaint  Patient presents with  . Fatigue  . Spasms    ADONIA PORADA is a 74 y.o. female.  Patient is a 74 year old female with a history of syncope, hypothyroidism, depression, abnormal involuntary movements, bipolar disease who had a fall on 09/12/2019 and broke her right humerus and was discharged home presenting today due to failure to thrive at home. Spoke with the patient's husband who gives the complete history. Husband states that patient typically ambulates with a walker and the day of her fall she was going to the bathroom and lost her balance. However he states that since she fell on the sixth she has not gotten out of bed. She is not able to walk because her legs are generally weak so she has laid in bed. She is taking more pain medication than she supposed to because he states her pain has been uncontrollable. However the pain medicine makes her very fatigued and groggy. Patient did admit to taking more pain medication than she is supposed to but otherwise has no other complaints. She denies any localized pain other than in her arm. She has had no fever, shortness of breath, vomiting or diarrhea. Husband states that she has been refusing to eat and he can barely get a few bites and sips of water down her. He has been able to get her her regular medicine on a daily basis. She was supposed to go to [facility] rehab today and they called and stated they were not taking anybody from the community at this time. Because things are going poorly at home and he is unable to care for her he sent her here for further evaluation.  The history is provided by the patient.       Past Medical History:  Diagnosis Date  . Abnormal involuntary movements(781.0)   . Alopecia, unspecified   . Anemia, unspecified   . Anxiety   . Bipolar disorder, unspecified (HCC)    . Depression   . Dysuria   . Edema   . Encounter for long-term (current) use of other medications   . Esophageal reflux   . Fibromyalgia   . Hyperthyroidism   . Insomnia   . Lumbago   . Nonspecific elevation of levels of transaminase or lactic acid dehydrogenase (LDH)   . Other abnormal blood chemistry    hyperglycemia  . Other malaise and fatigue   . Pathologic fracture of vertebrae   . Reflux esophagitis   . Syncope and collapse   . Tachycardia, unspecified   . Unspecified essential hypertension   . Unspecified hypothyroidism     Patient Active Problem List   Diagnosis Date Noted  . Thrombocytopenia (HCC) 11/19/2018  . Chronic pain 11/19/2018  . AKI (acute kidney injury) (HCC) 09/21/2018  . Hypotension 09/21/2018  . Acute back pain 04/18/2018  . Iron deficiency anemia   . Gastroesophageal reflux disease with esophagitis   . Hematochezia 02/24/2018  . Compression fracture of T7 vertebra (HCC) 02/24/2018  . Tachycardia 02/24/2018  . Uncomplicated opioid dependence (HCC) 09/25/2017  . Symptomatic anemia 05/20/2017  . Nausea without vomiting 06/23/2016  . Corn 06/23/2016  . Callus 06/02/2014  . Depression   . Insomnia   . Fibromyalgia   . Anxiety   . Hypothyroidism   . Hyperglycemia   . Malaise   . Bipolar disorder (HCC)   .  Edema   . Essential hypertension     Past Surgical History:  Procedure Laterality Date  . BIOPSY  02/25/2018   Procedure: BIOPSY;  Surgeon: Ladene Artist, MD;  Location: [facility] ENDOSCOPY;  Service: Endoscopy;;  . CHOLECYSTECTOMY  03/6282   complicated by bile leak  . COLONOSCOPY WITH PROPOFOL N/A 02/25/2018   Procedure: COLONOSCOPY WITH PROPOFOL;  Surgeon: Ladene Artist, MD;  Location: [facility] ENDOSCOPY;  Service: Endoscopy;  Laterality: N/A;  . ERCP  11/03/2010   for bile leak  . ESOPHAGOGASTRODUODENOSCOPY (EGD) WITH PROPOFOL N/A 02/25/2018   Procedure: ESOPHAGOGASTRODUODENOSCOPY (EGD) WITH PROPOFOL;  Surgeon: Ladene Artist, MD;  Location:  [facility] ENDOSCOPY;  Service: Endoscopy;  Laterality: N/A;  . LAPAROSCOPIC TUBAL LIGATION    . TONSILLECTOMY       OB History   No obstetric history on file.     Family History  Problem Relation Age of Onset  . Cancer Father        prostate  . Colon cancer Neg Hx     Social History   Tobacco Use  . Smoking status: Never Smoker  . Smokeless tobacco: Never Used  Substance Use Topics  . Alcohol use: No    Alcohol/week: 0.0 standard drinks  . Drug use: No    Home Medications Prior to Admission medications   Medication Sig Start Date End Date Taking? Authorizing Provider  Abaloparatide (TYMLOS) 3120 MCG/1.56ML SOPN Inject 80 mcg into the skin every morning.    [provider]  amitriptyline (ELAVIL) 25 MG tablet TAKE 1 TABLET(25 MG) BY MOUTH AT BEDTIME Patient taking differently: Take 25 mg by mouth at bedtime.  01/31/18   Reed, Tiffany L, DO  FLUoxetine (PROZAC) 40 MG capsule TAKE 1 CAPSULE BY MOUTH DAILY TO HELP NERVES AND DEPRESSION Patient taking differently: Take 40 mg by mouth daily.  01/12/18   Reed, Tiffany L, DO  levothyroxine (SYNTHROID, LEVOTHROID) 25 MCG tablet TAKE 1 TABLET BY MOUTH EVERY DAY Patient taking differently: Take 25 mcg by mouth daily before breakfast.  01/05/18   Reed, Tiffany L, DO  Multiple Vitamin (MULTIVITAMIN WITH MINERALS) TABS tablet Take 1 tablet by mouth daily.    [provider]  ondansetron (ZOFRAN-ODT) 4 MG disintegrating tablet Take 4 mg by mouth every 8 (eight) hours as needed for nausea or vomiting.  07/17/18   [provider]  oxyCODONE-acetaminophen (PERCOCET/ROXICET) 5-325 MG tablet Take 1 tablet by mouth every 4 (four) hours as needed for moderate pain or severe pain. 04/22/18   Lavina Hamman, MD  pantoprazole (PROTONIX) 40 MG tablet Take 1 tablet (40 mg total) by mouth 2 (two) times daily. Patient taking differently: Take 40 mg by mouth every morning.  09/22/18   Regalado, Belkys A, MD  pregabalin (LYRICA) 150 MG  capsule Take 1 capsule (150 mg total) by mouth daily. 01/19/18   Reed, Tiffany L, DO  Propylene Glycol (SYSTANE COMPLETE OP) Apply 1-2 drops to eye daily as needed (dryness).    [provider]  risperiDONE (RISPERDAL) 1 MG tablet TAKE 1 TABLET BY MOUTH TWICE DAILY TO HELP RELAX Patient taking differently: Take 1 mg by mouth at bedtime.  06/13/17   Gayland Curry, DO    Allergies    Fetzima [levomilnacipran]  Review of Systems   Review of Systems  All other systems reviewed and are negative.   Physical Exam Updated Vital Signs BP (!) 118/98 (BP Location: Left Arm)   Pulse 84   Temp 98.2 F (  36.8 C)   Resp 16   Ht 5\' 5"  (1.651 m)   Wt 66 kg   SpO2 95%   BMI 24.21 kg/m   Physical Exam Vitals and nursing note reviewed.  Constitutional:      General: She is not in acute distress.    Appearance: Normal appearance. She is well-developed and normal weight.     Comments: Sleepy but easily arousable. Involuntary jerking noted in the arms and legs  HENT:     Head: Normocephalic and atraumatic.     Comments: No sign of trauma to the head    Mouth/Throat:     Mouth: Mucous membranes are dry.     Comments: 2 mm pupils bilaterally Eyes:     Pupils: Pupils are equal, round, and reactive to light.  Cardiovascular:     Rate and Rhythm: Normal rate and regular rhythm.     Heart sounds: Normal heart sounds. No murmur. No friction rub.  Pulmonary:     Effort: Pulmonary effort is normal.     Breath sounds: Normal breath sounds. No wheezing or rales.  Abdominal:     General: Bowel sounds are normal. There is no distension.     Palpations: Abdomen is soft.     Tenderness: There is no abdominal tenderness. There is no guarding or rebound.  Musculoskeletal:        General: Tenderness and signs of injury present. No deformity. Normal range of motion.     Comments: No edema. Healing ecchymosis present over the right shoulder with edema present. Pain with any movement. Sling in  place.  Skin:    General: Skin is warm and dry.     Findings: No rash.  Neurological:     General: No focal deficit present.     Cranial Nerves: No cranial nerve deficit.     Deep Tendon Reflexes: Babinski sign absent on the right side. Babinski sign absent on the left side.     Reflex Scores:      Patellar reflexes are 3+ on the right side and 3+ on the left side.    Comments: Brisk patellar reflexes. No notable Babinski or clonus. Patient is sleepy but easily arousable and able to state her name, location and year.  Psychiatric:     Comments: Calm and cooperative. No hallucinations.     ED Results / Procedures / Treatments   Labs (all labs ordered are listed, but only abnormal results are displayed) Labs Reviewed  CBC WITH DIFFERENTIAL/PLATELET - Abnormal; Notable for the following components:      Result Value   RBC 3.76 (*)    Hemoglobin 11.6 (*)    All other components within normal limits  COMPREHENSIVE METABOLIC PANEL - Abnormal; Notable for the following components:   Sodium 134 (*)    Glucose, Bld 100 (*)    BUN 24 (*)    Creatinine, Ser 1.43 (*)    Calcium 8.6 (*)    Total Protein 5.3 (*)    Albumin 2.9 (*)    GFR calc non Af Amer 36 (*)    GFR calc Af Amer 42 (*)    All other components within normal limits  ACETAMINOPHEN LEVEL - Abnormal; Notable for the following components:   Acetaminophen (Tylenol), Serum <10 (*)    All other components within normal limits  SALICYLATE LEVEL - Abnormal; Notable for the following components:   Salicylate Lvl <7.0 (*)    All other components within normal limits  RESPIRATORY PANEL  BY RT PCR (FLU A&B, COVID)  ETHANOL  AMMONIA  RAPID URINE DRUG SCREEN, HOSP PERFORMED  URINALYSIS, ROUTINE W REFLEX MICROSCOPIC    EKG None  Radiology DG Chest 1 View  Result Date: 09/25/2019 CLINICAL DATA:  Cough, fatigue EXAM: CHEST  1 VIEW COMPARISON:  11/18/2018 FINDINGS: Moderate cardiomegaly. Large hiatal hernia. Calcified thoracic  aorta. Lungs are clear without focal consolidation, pleural effusion, or pneumothorax. IMPRESSION: No acute cardiopulmonary disease. Large hiatal hernia. Electronically Signed   By: Duanne Guess D.O.   On: 09/25/2019 16:01   CT HEAD WO CONTRAST  Result Date: 09/25/2019 CLINICAL DATA:  74 year old female with altered mental status. EXAM: CT HEAD WITHOUT CONTRAST TECHNIQUE: Contiguous axial images were obtained from the base of the skull through the vertex without intravenous contrast. COMPARISON:  Head CT dated 03/03/2009. FINDINGS: Brain: There is mild age-related atrophy and chronic microvascular ischemic changes. Probable small old left basal ganglia lacunar infarct. There is no acute intracranial hemorrhage. No mass effect or midline shift. No extra-axial fluid collection. Vascular: No hyperdense vessel or unexpected calcification. Skull: Normal. Negative for fracture or focal lesion. Sinuses/Orbits: Mild mucoperiosteal thickening of paranasal sinuses with partial opacification of the right maxillary sinus. The mastoid air cells are clear. Other: None IMPRESSION: 1. No acute intracranial hemorrhage. 2. Age-related atrophy and chronic microvascular ischemic changes. Electronically Signed   By: Elgie Collard M.D.   On: 09/25/2019 16:07    Procedures Procedures (including critical care time)  Medications Ordered in ED Medications  lactated ringers bolus 1,000 mL (has no administration in time range)    ED Course  I have reviewed the triage vital signs and the nursing notes.  Pertinent labs & imaging results that were available during my care of the patient were reviewed by me and considered in my medical decision making (see chart for details).    MDM Rules/Calculators/A&P                      Elderly female coming in by EMS today from home due to failure to thrive. Patient had a fall 2 weeks ago and broke her right humerus. Since that time husband states she has not gotten out of bed  or walk. Also she has been eating very little. She was supposed to go to rehab today but they called and canceled. Husband states he is unable to care for her and he is concerned for her wellbeing. Patient does have involuntary twitching on exam however that has been ongoing for months. Also she seems very sleepy but can be woken up easily. Per the patient and her husband she has been taking more oxycodone than what is prescribed. Also has been states she has not been eating. She has been taking all of her regular medications. Vital signs are stable. Other than being sleepy patient's exam is relatively benign. Head CT is negative for acute bleed, CBC is within normal limits. CMP with mild AKI with a creatinine of 1.4 from her baseline of 1.2, ammonia, salicylate, Tylenol levels are all within normal limits. Chest x-ray without acute findings. Covid status is pending. Patient given IV fluids but at this time does not have an admittable diagnosis. Spoke with the transitional care team for further assistance. Final Clinical Impression(s) / ED Diagnoses Final diagnoses:  Cough    Rx / DC Orders ED Discharge Orders    None       Gwyneth Sprout, MD 09/26/19 [phone]

## 2019-09-25 NOTE — Social Work (Signed)
EDCSW met with Pt at bedside for [facility] assessment.

## 2019-09-25 NOTE — Social Work (Signed)
EDCSW called [place] in an effort to find out more about the situation. Was unable to reach anyone in admissions, left messages.

## 2019-09-26 NOTE — NC FL2 (Signed)
[place] MEDICAID FL2 LEVEL OF CARE SCREENING TOOL     IDENTIFICATION  Patient Name: Sarah Miranda Birthdate: 1945/12/22 Sex: female Admission Date (Current Location): 09/25/2019  [facility] and [place] Number:  Producer, television/film/video and Address:  [facility] [place], [address], [place], [place] [address]      Provider Number: [id number]  Attending Physician Name and Address:  Default, Provider, MD  Relative Name and Phone Number:  Lee-Ann Gal (spouse) [phone]    Current Level of Care: Hospital Recommended Level of Care: Skilled Nursing Facility Prior Approval Number:    Date Approved/Denied:   PASRR Number: PENDING  Discharge Plan: SNF    Current Diagnoses: Patient Active Problem List   Diagnosis Date Noted  . Thrombocytopenia (HCC) 11/19/2018  . Chronic pain 11/19/2018  . AKI (acute kidney injury) (HCC) 09/21/2018  . Hypotension 09/21/2018  . Acute back pain 04/18/2018  . Iron deficiency anemia   . Gastroesophageal reflux disease with esophagitis   . Hematochezia 02/24/2018  . Compression fracture of T7 vertebra (HCC) 02/24/2018  . Tachycardia 02/24/2018  . Uncomplicated opioid dependence (HCC) 09/25/2017  . Symptomatic anemia 05/20/2017  . Nausea without vomiting 06/23/2016  . Corn 06/23/2016  . Callus 06/02/2014  . Depression   . Insomnia   . Fibromyalgia   . Anxiety   . Hypothyroidism   . Hyperglycemia   . Malaise   . Bipolar disorder (HCC)   . Edema   . Essential hypertension     Orientation RESPIRATION BLADDER Height & Weight     Self, Time, Situation, Place  Normal Continent Weight: 145 lb 8.1 oz (66 kg) Height:  5\' 5"  (165.1 cm)  BEHAVIORAL SYMPTOMS/MOOD NEUROLOGICAL BOWEL NUTRITION STATUS      Continent Diet(Regular)  AMBULATORY STATUS COMMUNICATION OF NEEDS Skin   Limited Assist Verbally Normal                       Personal Care Assistance Level of Assistance  Bathing, Feeding, Dressing Bathing  Assistance: Limited assistance Feeding assistance: Independent Dressing Assistance: Limited assistance     Functional Limitations Info  Sight, Hearing, Speech Sight Info: Adequate Hearing Info: Adequate Speech Info: Adequate    SPECIAL CARE FACTORS FREQUENCY  PT (By licensed PT), OT (By licensed OT)     PT Frequency: 5x weekly OT Frequency: 5x weekly            Contractures Contractures Info: Not present    Additional Factors Info  Code Status, Allergies Code Status Info: Full Allergies Info: Milk-related Compounds; Fetzima (Levomilnacipran)           Current Medications (09/26/2019):  This is the current hospital active medication list Current Facility-Administered Medications  Medication Dose Route Frequency Provider Last Rate Last Admin  . Abaloparatide SOPN 80 mcg  80 mcg Subcutaneous 09/28/2019, Whitney, MD      . amitriptyline (ELAVIL) tablet 25 mg  25 mg Oral QHS Napoleon Form, MD   25 mg at 09/25/19 2240  . FLUoxetine (PROZAC) capsule 40 mg  40 mg Oral Daily 09/27/19, MD   40 mg at 09/26/19 0934  . levothyroxine (SYNTHROID) tablet 25 mcg  25 mcg Oral QAC breakfast 09/28/19, MD   25 mcg at 09/26/19 0836  . multivitamin with minerals tablet 1 tablet  1 tablet Oral Daily 09/28/19, MD   1 tablet at 09/26/19 0934  . oxyCODONE-acetaminophen (PERCOCET/ROXICET) 5-325 MG per tablet 1 tablet  1 tablet Oral Q6H PRN Blanchie Dessert, MD   1 tablet at 09/26/19 0836  . pantoprazole (PROTONIX) EC tablet 40 mg  40 mg Oral Hassie Bruce, Loree Fee, MD   40 mg at 09/26/19 0836  . pregabalin (LYRICA) capsule 150 mg  150 mg Oral Daily Blanchie Dessert, MD   150 mg at 09/26/19 0935  . risperiDONE (RISPERDAL) tablet 1 mg  1 mg Oral QHS Blanchie Dessert, MD       Current Outpatient Medications  Medication Sig Dispense Refill  . Abaloparatide (TYMLOS) 3120 MCG/1.56ML SOPN Inject 80 mcg into the skin every morning.    Marland Kitchen albuterol (PROVENTIL HFA)  108 (90 Base) MCG/ACT inhaler Inhale 2 puffs into the lungs every 6 (six) hours as needed for wheezing or shortness of breath.    Marland Kitchen amitriptyline (ELAVIL) 25 MG tablet TAKE 1 TABLET(25 MG) BY MOUTH AT BEDTIME (Patient taking differently: Take 25 mg by mouth at bedtime. ) 30 tablet 0  . Cholecalciferol (VITAMIN D3) 50 MCG (2000 UT) TABS Take 2,000 Units by mouth daily with breakfast.    . dextromethorphan-guaiFENesin (MUCINEX DM) 30-600 MG 12hr tablet Take 1 tablet by mouth 2 (two) times daily as needed for cough (and congestion).    . Ferrous Sulfate (SLOW RELEASE IRON PO) Take 1 tablet by mouth daily with breakfast.    . FLUoxetine (PROZAC) 40 MG capsule TAKE 1 CAPSULE BY MOUTH DAILY TO HELP NERVES AND DEPRESSION (Patient taking differently: Take 40 mg by mouth daily. ) 60 capsule 0  . Multiple Vitamins-Minerals (ONE-A-DAY WOMENS PO) Take 1 tablet by mouth daily with breakfast.     . ondansetron (ZOFRAN-ODT) 4 MG disintegrating tablet Take 4 mg by mouth every 8 (eight) hours as needed for nausea or vomiting (DISSOLVE IN THE MOUTH).     Marland Kitchen oxyCODONE-acetaminophen (PERCOCET/ROXICET) 5-325 MG tablet Take 1 tablet by mouth every 4 (four) hours as needed for moderate pain or severe pain. (Patient taking differently: Take 1-2 tablets by mouth every 6 (six) hours as needed (for pain). ) 30 tablet 0  . pantoprazole (PROTONIX) 40 MG tablet Take 1 tablet (40 mg total) by mouth 2 (two) times daily. (Patient taking differently: Take 40 mg by mouth daily before breakfast. ) 30 tablet 2  . pregabalin (LYRICA) 150 MG capsule Take 1 capsule (150 mg total) by mouth daily. 30 capsule 0  . Propylene Glycol (SYSTANE COMPLETE OP) Place 1-2 drops into both eyes as needed (for dryness).     . risperiDONE (RISPERDAL) 1 MG tablet TAKE 1 TABLET BY MOUTH TWICE DAILY TO HELP RELAX (Patient taking differently: Take 1 mg by mouth at bedtime. ) 180 tablet 0  . levothyroxine (SYNTHROID, LEVOTHROID) 25 MCG tablet TAKE 1 TABLET BY MOUTH  EVERY DAY (Patient taking differently: Take 25 mcg by mouth daily before breakfast. ) 90 tablet 0     Discharge Medications: Please see discharge summary for a list of discharge medications.  Relevant Imaging Results:  Relevant Lab Results:   Additional Information SSN: [id number]  Janace Hoard, LCSW

## 2019-09-26 NOTE — Evaluation (Signed)
Physical Therapy Evaluation Patient Details Name: Sarah Miranda MRN: [id number] DOB: 06-08-1946 Today's Date: 09/26/2019   History of Present Illness  Pt is a 74 y/o female presenting to the ED secondary to failure to thrive at home. Pt with recent R humerus fracture and has had multiple falls at home. PMH includes depression, bipolar disroder, fibromyalgia, and HTN.   Clinical Impression  Pt presenting with problem above with deficits below. Pt requiring mod to max A to stand. Pt unsteady and demonstrating posterior lean. Pt reports her husband will likely not be able to physically assist. Feel pt is at increased risk for falls and would benefit from further rehab prior to return home. Will continue to follow acutely to maximize functional mobility independence and safety.     Follow Up Recommendations SNF;Supervision/Assistance - 24 hour    Equipment Recommendations  None recommended by PT    Recommendations for Other Services       Precautions / Restrictions Precautions Precautions: Fall Precaution Comments: Pt with recent fall resulting in R humerus fracture.  Required Braces or Orthoses: Sling Restrictions Weight Bearing Restrictions: Yes RUE Weight Bearing: Non weight bearing      Mobility  Bed Mobility Overal bed mobility: Needs Assistance Bed Mobility: Supine to Sit;Sit to Supine     Supine to sit: Mod assist Sit to supine: Min assist   General bed mobility comments: Mod A for trunk elevation to come to sitting. Min A for trunk assist to return to supine.   Transfers Overall transfer level: Needs assistance Equipment used: 1 person hand held assist Transfers: Sit to/from Stand Sit to Stand: Mod assist;Max assist         General transfer comment: Mod to max A for lift assist and steadying to stand. Pt with posterior lean and increased shakiness in both legs, so returned to sitting.   Ambulation/Gait                Stairs             Wheelchair Mobility    Modified Rankin (Stroke Patients Only)       Balance Overall balance assessment: Needs assistance Sitting-balance support: No upper extremity supported;Feet supported Sitting balance-Leahy Scale: Fair     Standing balance support: Single extremity supported;During functional activity Standing balance-Leahy Scale: Poor Standing balance comment: posterior lean and requiring external assist.                              Pertinent Vitals/Pain Pain Assessment: Faces Faces Pain Scale: Hurts a little bit Pain Location: R shoulder Pain Descriptors / Indicators: Aching Pain Intervention(s): Limited activity within patient's tolerance;Monitored during session;Repositioned    Home Living Family/patient expects to be discharged to:: Private residence Living Arrangements: Spouse/significant other Available Help at Discharge: Family Type of Home: House Home Access: Other (comment)(1 curb step then level surface to enter home)     Home Layout: One level Home Equipment: Walker - 2 wheels Additional Comments: Reports husband uses cane and would likely need assist for caring for her at home.     Prior Function Level of Independence: Needs assistance   Gait / Transfers Assistance Needed: Since falling, required assist to ambulate from husband.   ADL's / Homemaking Assistance Needed: Requires assist for bathing/dressing.         Hand Dominance        Extremity/Trunk Assessment   Upper Extremity Assessment Upper Extremity Assessment: RUE  deficits/detail RUE Deficits / Details: RUE in sling. R humerus fracture from previous fall.     Lower Extremity Assessment Lower Extremity Assessment: Generalized weakness    Cervical / Trunk Assessment Cervical / Trunk Assessment: Kyphotic  Communication   Communication: No difficulties  Cognition Arousal/Alertness: Awake/alert Behavior During Therapy: WFL for tasks assessed/performed Overall  Cognitive Status: No family/caregiver present to determine baseline cognitive functioning                                        General Comments      Exercises     Assessment/Plan    PT Assessment Patient needs continued PT services  PT Problem List Decreased strength;Decreased balance;Decreased mobility;Decreased range of motion;Decreased knowledge of use of DME;Decreased knowledge of precautions;Pain       PT Treatment Interventions DME instruction;Gait training;Therapeutic activities;Functional mobility training;Therapeutic exercise;Balance training;Patient/family education    PT Goals (Current goals can be found in the Care Plan section)  Acute Rehab PT Goals Patient Stated Goal: to get stronger before going home PT Goal Formulation: With patient Time For Goal Achievement: 10/10/19 Potential to Achieve Goals: Good    Frequency Min 2X/week   Barriers to discharge Other (comment) Pt reports husband unable to physically assist.     Co-evaluation               AM-PAC PT "6 Clicks" Mobility  Outcome Measure Help needed turning from your back to your side while in a flat bed without using bedrails?: A Lot Help needed moving from lying on your back to sitting on the side of a flat bed without using bedrails?: A Lot Help needed moving to and from a bed to a chair (including a wheelchair)?: A Lot Help needed standing up from a chair using your arms (e.g., wheelchair or bedside chair)?: A Lot Help needed to walk in hospital room?: A Lot Help needed climbing 3-5 steps with a railing? : Total 6 Click Score: 11    End of Session Equipment Utilized During Treatment: Gait belt;Other (comment)(R shoulder sling) Activity Tolerance: Patient tolerated treatment well Patient left: in bed;with call bell/phone within reach Nurse Communication: Mobility status PT Visit Diagnosis: History of falling (Z91.81);Repeated falls (R29.6);Muscle weakness (generalized)  (M62.81);Unsteadiness on feet (R26.81)    Time: 4665-9935 PT Time Calculation (min) (ACUTE ONLY): 15 min   Charges:   PT Evaluation $PT Eval Moderate Complexity: 1 Mod          Reuel Derby, PT, DPT  Acute Rehabilitation Services  Pager: [phone] Office: [phone]   Rudean Hitt 09/26/2019, 1:28 PM

## 2019-09-26 NOTE — ED Notes (Signed)
Pt walked to bathroom and back with one assist.

## 2019-09-26 NOTE — ED Notes (Signed)
Diet was ordered for Lunch. 

## 2019-09-26 NOTE — ED Notes (Signed)
Assisted pt to the RR and back using wheelchair.

## 2019-09-26 NOTE — ED Notes (Signed)
Assisted pt to restroom via wheel chair and back to room.

## 2019-09-26 NOTE — TOC Initial Note (Signed)
Transition of Care [facility]) - Initial/Assessment Note    Patient Details  Name: Sarah Miranda MRN: [id number] Date of Birth: 1945-10-16  Transition of Care [facility]) CM/SW Contact:    Sarah Circle, LCSW Phone Number: 09/26/2019, 09:13 AM  Clinical Narrative:                 CSW received consult that [facility] was [facility] for [facility], but it did not work out. CSW contacted [place] with [facility] admissions who reports that she does not have any bed availability at this time and is unsure when she will have an available bed.   CSW spoke with patient. Patient reports that [facility] and she has been there before. She understood that there are no available beds and is open to go to another facility in the area. CSW awaiting PT to see patient for recommendation. CSW will continue to follow up.   Expected Discharge Plan: Skilled Nursing Facility Barriers to Discharge: No Barriers Identified   Patient Goals and CMS Choice Patient states their goals for this hospitalization and ongoing recovery are:: To get better and stronger CMS Medicare.gov Compare Post Acute Care list provided to:: Patient Choice offered to / list presented to : Patient  Expected Discharge Plan and Services Expected Discharge Plan: Skilled Nursing Facility In-house Referral: Clinical Social Work   Post Acute Care Choice: Skilled Nursing Facility Living arrangements for the past 2 months: Single Family Home                                      Prior Living Arrangements/Services Living arrangements for the past 2 months: Single Family Home Lives with:: Spouse Patient language and need for interpreter reviewed:: Yes Do you feel safe going back to the place where you live?: Yes      Need for Family Participation in Patient Care: Yes (Comment) Care giver support system in place?: Yes (comment)   Criminal Activity/Legal Involvement Pertinent to Current  Situation/Hospitalization: No - Comment as needed  Activities of Daily Living      Permission Sought/Granted Permission sought to share information with : Family Supports, Oceanographer granted to share information with : Yes, Verbal Permission Granted  Share Information with NAME: Sarah Miranda  Permission granted to share info w AGENCY: SNFs  Permission granted to share info w Relationship: spouse  Permission granted to share info w Contact Information: [phone]  Emotional Assessment Appearance:: Appears older than stated age Attitude/Demeanor/Rapport: Engaged Affect (typically observed): Pleasant Orientation: : Oriented to Self, Oriented to Place, Oriented to  Time, Oriented to Situation Alcohol / Substance Use: Not Applicable Psych Involvement: No (comment)  Admission diagnosis:  drowsy/twitching Patient Active Problem List   Diagnosis Date Noted  . Thrombocytopenia (HCC) 11/19/2018  . Chronic pain 11/19/2018  . AKI (acute kidney injury) (HCC) 09/21/2018  . Hypotension 09/21/2018  . Acute back pain 04/18/2018  . Iron deficiency anemia   . Gastroesophageal reflux disease with esophagitis   . Hematochezia 02/24/2018  . Compression fracture of T7 vertebra (HCC) 02/24/2018  . Tachycardia 02/24/2018  . Uncomplicated opioid dependence (HCC) 09/25/2017  . Symptomatic anemia 05/20/2017  . Nausea without vomiting 06/23/2016  . Corn 06/23/2016  . Callus 06/02/2014  . Depression   . Insomnia   . Fibromyalgia   . Anxiety   . Hypothyroidism   . Hyperglycemia   .  Malaise   . Bipolar disorder ([place])   . Edema   . Essential hypertension    PCP:  Sarah Infante, MD Pharmacy:   [id number] [place], [address] [place] [address] Phone: [phone] Fax: [phone]     Social Determinants of Health (SDOH) Interventions    Readmission Risk Interventions No flowsheet data found.

## 2019-09-26 NOTE — ED Notes (Signed)
Patient has called out twice in 15 minutes asking for a cup of water; Staff advised we would get her water in a few minutes as RN is still in report from shift change; Patient states if she does not get water now " I will pass out in a few minutes because I"m dehydrated" RN has noted that patient has several cups at bedside and has obviously had liquids recently; RN attempted to explain to patient that she will get to her momentarily but patient got inpatient with staff and raised her voice; RN and tech had to stop patient care of another patient to get patient her cup of water-Monique,RN

## 2019-09-26 NOTE — Progress Notes (Signed)
CSW spoke with patient and patient's husband who both accept [facility] for SNF. CSW awaiting patient's PASSR number. CSW started [facility] auth. CSW will continue to follow up.   Geralyn Corwin, LCSW Transitions of Care Department [facility] ED [phone]

## 2019-09-27 ENCOUNTER — Emergency Department (HOSPITAL_COMMUNITY): Payer: Medicare Other

## 2019-09-27 MED ORDER — ALBUTEROL SULFATE HFA 108 (90 BASE) MCG/ACT IN AERS
2.0000 | INHALATION_SPRAY | Freq: Once | RESPIRATORY_TRACT | Status: AC
  Administered 2019-09-27: 2 via RESPIRATORY_TRACT
  Filled 2019-09-27: qty 6.7

## 2019-09-27 MED ORDER — AEROCHAMBER PLUS FLO-VU LARGE MISC
Status: AC
  Filled 2019-09-27: qty 1

## 2019-09-27 NOTE — Social Work (Signed)
EDCSW spoke with Pt at bedside to update about progress in SNF placement. Pt was upbeat and positive.

## 2019-09-27 NOTE — Progress Notes (Signed)
Patient completed Level II PASSR screening with [facility] via phone. CSW awaiting PASSR number. CSW did receive insurance auth with [facility]. Authorization number is [id number]. Authorization is good until the 10/01/2019.   CSW spoke with Morrie Sheldon at [facility] who reports they cannot accept patient until patient has her PASSR number. Patient will likely be able to go in the morning with hopes that her PASSR number will have come through. CSW will continue to follow up.   Geralyn Corwin, LCSW Transitions of Care Department [facility] ED [phone]

## 2019-09-28 LAB — SARS CORONAVIRUS 2 (TAT 6-24 HRS): SARS Coronavirus 2: NEGATIVE

## 2019-09-28 NOTE — ED Notes (Signed)
Report given to Tim at [facility]. Waiting for PTAR

## 2019-09-28 NOTE — Discharge Instructions (Signed)
Please proceed to your nursing facility.  Return here for concerning changes in your condition.

## 2019-09-28 NOTE — ED Notes (Signed)
PTAR called @ 1334-per Verlon Au, RN called by Marylene Land

## 2019-09-28 NOTE — ED Notes (Signed)
Patient verbalizes understanding of discharge instructions. Opportunity for questioning and answers were provided. Armband removed by staff, pt discharged from ED in care of PTAR to transport to [facility]. Report called to [facility] by previous Charity fundraiser.

## 2019-09-28 NOTE — Progress Notes (Signed)
Patient has been accepted to [facility]. Patient will be going to room 106. Number for report is [phone]. Patient can be transported via PTAR.  Geralyn Corwin, LCSW Transitions of Care Department [facility] ED [phone]

## 2019-10-01 ENCOUNTER — Other Ambulatory Visit: Payer: Self-pay | Admitting: Internal Medicine

## 2019-10-01 ENCOUNTER — Encounter: Payer: Self-pay | Admitting: Internal Medicine

## 2019-10-01 ENCOUNTER — Non-Acute Institutional Stay (SKILLED_NURSING_FACILITY): Payer: Medicare Other | Admitting: Internal Medicine

## 2019-10-01 DIAGNOSIS — R627 Adult failure to thrive: Secondary | ICD-10-CM

## 2019-10-01 DIAGNOSIS — F3175 Bipolar disorder, in partial remission, most recent episode depressed: Secondary | ICD-10-CM

## 2019-10-01 DIAGNOSIS — F1120 Opioid dependence, uncomplicated: Secondary | ICD-10-CM | POA: Diagnosis not present

## 2019-10-01 DIAGNOSIS — K2100 Gastro-esophageal reflux disease with esophagitis, without bleeding: Secondary | ICD-10-CM

## 2019-10-01 DIAGNOSIS — M797 Fibromyalgia: Secondary | ICD-10-CM

## 2019-10-01 DIAGNOSIS — S42201S Unspecified fracture of upper end of right humerus, sequela: Secondary | ICD-10-CM | POA: Diagnosis not present

## 2019-10-01 DIAGNOSIS — F331 Major depressive disorder, recurrent, moderate: Secondary | ICD-10-CM

## 2019-10-01 DIAGNOSIS — F99 Mental disorder, not otherwise specified: Secondary | ICD-10-CM

## 2019-10-01 DIAGNOSIS — M818 Other osteoporosis without current pathological fracture: Secondary | ICD-10-CM

## 2019-10-01 DIAGNOSIS — F5105 Insomnia due to other mental disorder: Secondary | ICD-10-CM

## 2019-10-01 MED ORDER — OXYCODONE-ACETAMINOPHEN 5-325 MG PO TABS: 1.0000 | ORAL_TABLET | Freq: Four times a day (QID) | ORAL | 0 refills | Status: DC | PRN

## 2019-10-01 MED ORDER — OXYCODONE-ACETAMINOPHEN 5-325 MG PO TABS: 1.0000 | ORAL_TABLET | Freq: Three times a day (TID) | ORAL | 0 refills | Status: DC | PRN

## 2019-10-01 NOTE — Progress Notes (Signed)
: Provider:  Margit HanksAlexander, Cheryl Chay D., MD [facility]:  [facility] Room Number: 106-P Place of Service:  SNF (31)  PCP: Rodrigo RanPerini, Mark, MD Patient Care Team: Rodrigo RanPerini, Mark, MD as PCP - General (Internal Medicine)  Extended Emergency Contact Information Primary Emergency Contact: Froman,Bill Address: [address]          [place], [place] [id number] [place] Home Phone: [phone] Mobile Phone: [phone] Relation: Spouse Secondary Emergency Contact: Doristine MangoFlowers,Kurt Thorp [place] Home Phone: [phone] Mobile Phone: [phone] Relation: Son     Allergies: Fetzima [levomilnacipran] and Milk-related compounds  Chief Complaint  Patient presents with  . New Admit To SNF    HPI: Patient is a 74 y.o. female with history of syncope, hypothyroidism, depression, abnormal involuntary movement, bipolar disease who had a fall on 1 02/2028 and broke her right humerus and was discharged home.  Husband states that since she has gotten home she has not gotten out of bed, she cannot walk because her legs are generally weak and she is taking more pain medicine since she supposed to because she says her pain is not controlled.  However the pain medicine does make her fatigued and groggy.  Patient denies any localized pain other than her arm, has had no fever shortness of breath vomiting or diarrhea.  Patient states she has been refusing to eat and can barely get a few sips of water after he bites down her.  She has been taking her meds on a regular basis.  He is unable to take care of her any longer at home.  Patient is admitting from becoming emergency department directly to skilled nursing facility on 1/22 for failure to thrive.  While at skilled nursing facility patient will be followed for bipolar disease treated with Risperdal, depression treated with Prozac and hypothyroidism treated with Synthroid.  Past Medical History:  Diagnosis Date  .  Abnormal involuntary movements(781.0)   . Alopecia, unspecified   . Anemia, unspecified   . Anxiety   . Bipolar disorder, unspecified (HCC)   . Depression   . Dysuria   . Edema   . Encounter for long-term (current) use of other medications   . Esophageal reflux   . Fibromyalgia   . Hyperthyroidism   . Insomnia   . Lumbago   . Nonspecific elevation of levels of transaminase or lactic acid dehydrogenase (LDH)   . Other abnormal blood chemistry    hyperglycemia  . Other malaise and fatigue   . Pathologic fracture of vertebrae   . Reflux esophagitis   . Syncope and collapse   . Tachycardia, unspecified   . Unspecified essential hypertension   . Unspecified hypothyroidism     Past Surgical History:  Procedure Laterality Date  . BIOPSY  02/25/2018   Procedure: BIOPSY;  Surgeon: Meryl DareStark, Malcolm T, MD;  [facility];  Service: Endoscopy;;  . CHOLECYSTECTOMY  10/2010   complicated by bile leak  . COLONOSCOPY WITH PROPOFOL N/A 02/25/2018   Procedure: COLONOSCOPY WITH PROPOFOL;  Surgeon: Meryl DareStark, Malcolm T, MD;  [facility];  Service: Endoscopy;  Laterality: N/A;  . ERCP  11/03/2010   for bile leak  . ESOPHAGOGASTRODUODENOSCOPY (EGD) WITH PROPOFOL N/A 02/25/2018   Procedure: ESOPHAGOGASTRODUODENOSCOPY (EGD) WITH PROPOFOL;  Surgeon: Meryl DareStark, Malcolm T, MD;  [facility];  Service: Endoscopy;  Laterality: N/A;  . LAPAROSCOPIC TUBAL LIGATION    . TONSILLECTOMY      Allergies as of 10/01/2019  Reactions   Fetzima [levomilnacipran] Nausea Only   Milk-related Compounds Other (See Comments)   Causes chest congestion after eating      Medication List       Accurate as of October 01, 2019 11:59 PM. If you have any questions, ask your nurse or doctor.        STOP taking these medications   dextromethorphan-guaiFENesin 30-600 MG 12hr tablet Commonly known as: [place] DM Stopped by: Inocencio Homes, MD   Proventil HFA 108 [phone] Base) MCG/ACT inhaler Generic  drug: albuterol Stopped by: Inocencio Homes, MD   SLOW RELEASE IRON PO Stopped by: Inocencio Homes, MD   Vitamin D3 50 MCG (2000 UT) Tabs Stopped by: Inocencio Homes, MD     TAKE these medications   amitriptyline 25 MG tablet Commonly known as: ELAVIL TAKE 1 TABLET(25 MG) BY MOUTH AT BEDTIME   FLUoxetine 40 MG capsule Commonly known as: PROZAC TAKE 1 CAPSULE BY MOUTH DAILY TO HELP NERVES AND DEPRESSION   levothyroxine 25 MCG tablet Commonly known as: SYNTHROID TAKE 1 TABLET BY MOUTH EVERY DAY   ondansetron 4 MG disintegrating tablet Commonly known as: ZOFRAN-ODT Take 4 mg by mouth every 8 (eight) hours as needed for nausea or vomiting (DISSOLVE IN THE MOUTH).   ONE-A-DAY WOMENS PO Take 1 tablet by mouth daily with breakfast.   oxyCODONE-acetaminophen 5-325 MG tablet Commonly known as: PERCOCET/ROXICET Take 1 tablet by mouth every 6 (six) hours as needed for up to 7 days for severe pain. What changed: Another medication with the same name was removed. Continue taking this medication, and follow the directions you see here. Changed by: Inocencio Homes, MD   oxyCODONE-acetaminophen 5-325 MG tablet Commonly known as: PERCOCET/ROXICET Take 1 tablet by mouth every 8 (eight) hours as needed for up to 7 days for severe pain. What changed: Another medication with the same name was removed. Continue taking this medication, and follow the directions you see here. Changed by: Inocencio Homes, MD   pantoprazole 40 MG tablet Commonly known as: PROTONIX Take 40 mg by mouth daily. What changed: Another medication with the same name was removed. Continue taking this medication, and follow the directions you see here. Changed by: Inocencio Homes, MD   pregabalin 150 MG capsule Commonly known as: Lyrica Take 1 capsule (150 mg total) by mouth daily.   risperiDONE 1 MG tablet Commonly known as: RISPERDAL Take 1 mg by mouth at bedtime. What changed: Another medication with the same name  was removed. Continue taking this medication, and follow the directions you see here. Changed by: Inocencio Homes, MD   SYSTANE COMPLETE OP Place 2 drops into both eyes as needed (for dryness).   Tymlos 3120 MCG/1.56ML Sopn Generic drug: Abaloparatide Inject 80 mcg into the skin every morning.       No orders of the defined types were placed in this encounter.   Immunization History  Administered Date(s) Administered  . Influenza Whole 05/20/2013  . Influenza, High Dose Seasonal PF 05/30/2017  . Influenza,inj,Quad PF,6+ Mos 05/29/2014, 05/13/2015  . Influenza-Unspecified 06/03/2016, 05/08/2019  . Pneumococcal Conjugate-13 09/05/2015  . Pneumococcal Polysaccharide-23 10/13/2011  . Td 10/13/2011  . Zoster 10/20/2011    Social History   Tobacco Use  . Smoking status: Never Smoker  . Smokeless tobacco: Never Used  Substance Use Topics  . Alcohol use: No    Alcohol/week: 0.0 standard drinks    Family history is   Family History  Problem Relation Age of Onset  . Cancer  Father        prostate  . Colon cancer Neg Hx       Review of Systems  GENERAL:  no fevers, fatigue, appetite changes SKIN: No itching, or rash EYES: No eye pain, redness, discharge EARS: No earache, tinnitus, change in hearing NOSE: No congestion, drainage or bleeding  MOUTH/THROAT: No mouth or tooth pain, No sore throat RESPIRATORY: No cough, wheezing, SOB CARDIAC: No chest pain, palpitations, lower extremity edema  GI: No abdominal pain, No N/V/D or constipation, No heartburn or reflux  GU: No dysuria, frequency or urgency, or incontinence  MUSCULOSKELETAL: No unrelieved bone/joint pain NEUROLOGIC: No headache, dizziness or focal weakness PSYCHIATRIC: No c/o anxiety or sadness   Vitals:   10/01/19 0812  BP: (!) 146/87  Pulse: 92  Resp: 19  Temp: (!) 96.7 F (35.9 C)  SpO2: 97%    SpO2 Readings from Last 1 Encounters:  10/01/19 97%   Body mass index is 24.21  kg/m.     Physical Exam  GENERAL APPEARANCE: Alert, conversant,  No acute distress.  SKIN: No diaphoresis rash HEAD: Normocephalic, atraumatic  EYES: Conjunctiva/lids clear. Pupils round, reactive. EOMs intact.  EARS: External exam WNL, canals clear. Hearing grossly normal.  NOSE: No deformity or discharge.  MOUTH/THROAT: Lips w/o lesions  RESPIRATORY: Breathing is even, unlabored. Lung sounds are clear   CARDIOVASCULAR: Heart RRR no murmurs, rubs or gallops. No peripheral edema.   GASTROINTESTINAL: Abdomen is soft, non-tender, not distended w/ normal bowel sounds. GENITOURINARY: Bladder non tender, not distended  MUSCULOSKELETAL: Left shoulder with some swelling to area and resolving bruise NEUROLOGIC:  Cranial nerves 2-12 grossly intact. Moves all extremities  PSYCHIATRIC: Mood and affect odd, no behavioral issues  Patient Active Problem List   Diagnosis Date Noted  . Thrombocytopenia (HCC) 11/19/2018  . Chronic pain 11/19/2018  . AKI (acute kidney injury) (HCC) 09/21/2018  . Hypotension 09/21/2018  . Acute back pain 04/18/2018  . Iron deficiency anemia   . Gastroesophageal reflux disease with esophagitis   . Hematochezia 02/24/2018  . Compression fracture of T7 vertebra (HCC) 02/24/2018  . Tachycardia 02/24/2018  . Uncomplicated opioid dependence (HCC) 09/25/2017  . Symptomatic anemia 05/20/2017  . Nausea without vomiting 06/23/2016  . Corn 06/23/2016  . Callus 06/02/2014  . Depression   . Insomnia   . Fibromyalgia   . Anxiety   . Hypothyroidism   . Hyperglycemia   . Malaise   . Bipolar disorder (HCC)   . Edema   . Essential hypertension       Labs reviewed: Basic Metabolic Panel:    Component Value Date/Time   NA 134 (L) 09/25/2019 1530   NA 130 (L) 11/11/2015 1443   K 4.1 09/25/2019 1530   CL 100 09/25/2019 1530   CO2 25 09/25/2019 1530   GLUCOSE 100 (H) 09/25/2019 1530   BUN 24 (H) 09/25/2019 1530   BUN 9 11/11/2015 1443   CREATININE 1.43 (H)  09/25/2019 1530   CREATININE 0.93 01/19/2018 1005   CALCIUM 8.6 (L) 09/25/2019 1530   PROT 5.3 (L) 09/25/2019 1530   PROT 5.8 (L) 11/11/2015 1443   ALBUMIN 2.9 (L) 09/25/2019 1530   ALBUMIN 3.9 11/11/2015 1443   AST 17 09/25/2019 1530   ALT 12 09/25/2019 1530   ALKPHOS 61 09/25/2019 1530   BILITOT 0.7 09/25/2019 1530   BILITOT 0.2 11/11/2015 1443   GFRNONAA 36 (L) 09/25/2019 1530   GFRNONAA 61 01/19/2018 1005   GFRAA 42 (L) 09/25/2019 1530  GFRAA 71 01/19/2018 1005    Recent Labs    11/19/18 0236 09/12/19 2008 09/25/19 1530  NA 138 138 134*  K 3.3* 3.7 4.1  CL 110 105 100  CO2 20* 22 25  GLUCOSE 112* 103* 100*  BUN 13 26* 24*  CREATININE 1.26* 1.29* 1.43*  CALCIUM 8.1* 8.9 8.6*   Liver Function Tests: Recent Labs    11/19/18 0236 09/12/19 2008 09/25/19 1530  AST 14* 21 17  ALT 9 15 12   ALKPHOS 49 49 61  BILITOT 0.4 0.5 0.7  PROT 5.2* 5.7* 5.3*  ALBUMIN 3.2* 3.3* 2.9*   No results for input(s): LIPASE, AMYLASE in the last 8760 hours. Recent Labs    09/25/19 1530  AMMONIA 21   CBC: Recent Labs    11/19/18 0236 09/12/19 1746 09/25/19 1530  WBC 6.8 12.2* 8.1  NEUTROABS 4.0 9.3* 5.0  HGB 12.0 14.3 11.6*  HCT 39.1 46.1* 37.6  MCV 94.2 99.1 100.0  PLT 191 216 209   Lipid No results for input(s): CHOL, HDL, LDLCALC, TRIG in the last 8760 hours.  Cardiac Enzymes: No results for input(s): CKTOTAL, CKMB, CKMBINDEX, TROPONINI in the last 8760 hours. BNP: No results for input(s): BNP in the last 8760 hours. No results found for: [facility] Lab Results  Component Value Date   HGBA1C 4.7 05/19/2017   Lab Results  Component Value Date   TSH 1.259 11/19/2018   Lab Results  Component Value Date   VITAMINB12 324 11/19/2018   Lab Results  Component Value Date   FOLATE 22.7 11/19/2018   Lab Results  Component Value Date   IRON 18 (L) 11/19/2018   TIBC 246 (L) 11/19/2018   FERRITIN 36 11/19/2018    Imaging and Procedures obtained prior to SNF  admission: DG Chest 1 View  Result Date: 09/25/2019 CLINICAL DATA:  Cough, fatigue EXAM: CHEST  1 VIEW COMPARISON:  11/18/2018 FINDINGS: Moderate cardiomegaly. Large hiatal hernia. Calcified thoracic aorta. Lungs are clear without focal consolidation, pleural effusion, or pneumothorax. IMPRESSION: No acute cardiopulmonary disease. Large hiatal hernia. Electronically Signed   By: 11/20/2018 D.O.   On: 09/25/2019 16:01   CT HEAD WO CONTRAST  Result Date: 09/25/2019 CLINICAL DATA:  74 year old female with altered mental status. EXAM: CT HEAD WITHOUT CONTRAST TECHNIQUE: Contiguous axial images were obtained from the base of the skull through the vertex without intravenous contrast. COMPARISON:  Head CT dated 03/03/2009. FINDINGS: Brain: There is mild age-related atrophy and chronic microvascular ischemic changes. Probable small old left basal ganglia lacunar infarct. There is no acute intracranial hemorrhage. No mass effect or midline shift. No extra-axial fluid collection. Vascular: No hyperdense vessel or unexpected calcification. Skull: Normal. Negative for fracture or focal lesion. Sinuses/Orbits: Mild mucoperiosteal thickening of paranasal sinuses with partial opacification of the right maxillary sinus. The mastoid air cells are clear. Other: None IMPRESSION: 1. No acute intracranial hemorrhage. 2. Age-related atrophy and chronic microvascular ischemic changes. Electronically Signed   By: 03/05/2009 M.D.   On: 09/25/2019 16:07     Not all labs, radiology exams or other studies done during hospitalization come through on my EPIC note; however they are reviewed by me.    Assessment and Plan  Right shoulder fracture/failure to thrive-patient has not been out of bed since she fractured her shoulder and she is eating and drinking very little.  She has been taking her meds SNF-admitted for OT/PT for shoulder strengthening and for supportive care  Narcotic addiction SNF-we will be weaning  patient off of narcotics at the very least decrease to a much less potent narcotic like tramadol  Bipolar disorder/depression SNF-continue Risperdal 1 p.o. twice daily and Prozac 40 mg daily  Fibromyalgia SNF-appears controlled; continue Lyrica 150 mg daily  Depression SNF-appears controlled; continue Prozac 40 mg daily  Osteoporosis SNF-continue Tymlos 80 mcg SQ every morning and vitamin D 2000 units daily  Hypothyroidism SNF-appears controlled; continue levothyroxine 25 mcg daily  GERD SNF-no complaints; continue Protonix 40 mg twice daily  Insomnia SNF-appears controlled; continue amitriptyline 25 mg nightly    Time spent greater than 45 minutes;> 50% of time with patient was spent reviewing records, labs, tests and studies, counseling and developing plan of care  Margit Hanks, MD

## 2019-10-06 ENCOUNTER — Encounter: Payer: Self-pay | Admitting: Internal Medicine

## 2019-10-06 DIAGNOSIS — M810 Age-related osteoporosis without current pathological fracture: Secondary | ICD-10-CM | POA: Insufficient documentation

## 2019-10-06 DIAGNOSIS — S42209A Unspecified fracture of upper end of unspecified humerus, initial encounter for closed fracture: Secondary | ICD-10-CM | POA: Insufficient documentation

## 2019-10-06 DIAGNOSIS — R627 Adult failure to thrive: Secondary | ICD-10-CM | POA: Insufficient documentation

## 2019-10-08 ENCOUNTER — Encounter: Payer: Self-pay | Admitting: Internal Medicine

## 2019-10-08 ENCOUNTER — Other Ambulatory Visit: Payer: Self-pay | Admitting: Internal Medicine

## 2019-10-08 ENCOUNTER — Non-Acute Institutional Stay (SKILLED_NURSING_FACILITY): Payer: Medicare Other | Admitting: Internal Medicine

## 2019-10-08 ENCOUNTER — Ambulatory Visit: Payer: Medicare Other

## 2019-10-08 DIAGNOSIS — M797 Fibromyalgia: Secondary | ICD-10-CM | POA: Diagnosis not present

## 2019-10-08 DIAGNOSIS — F3175 Bipolar disorder, in partial remission, most recent episode depressed: Secondary | ICD-10-CM | POA: Diagnosis not present

## 2019-10-08 DIAGNOSIS — S42201S Unspecified fracture of upper end of right humerus, sequela: Secondary | ICD-10-CM

## 2019-10-08 DIAGNOSIS — K2100 Gastro-esophageal reflux disease with esophagitis, without bleeding: Secondary | ICD-10-CM

## 2019-10-08 DIAGNOSIS — F1120 Opioid dependence, uncomplicated: Secondary | ICD-10-CM | POA: Diagnosis not present

## 2019-10-08 MED ORDER — OXYCODONE HCL 5 MG PO TABS: 5.0000 mg | ORAL_TABLET | Freq: Four times a day (QID) | ORAL | 0 refills | Status: DC | PRN

## 2019-10-08 NOTE — Progress Notes (Signed)
Location:    [facility] Room Number: 106/P Place of Service:  SNF [phone]) Provider:  Delle Reining, MD  Patient Care Team: Rodrigo Ran, MD as PCP - General (Internal Medicine)  Extended Emergency Contact Information Primary Emergency Contact: Buffkin,Bill Address: [address] [place], [place] [address] [place] Home Phone: [phone] Mobile Phone: [phone] Relation: Spouse Secondary Emergency Contact: Doristine Mango [place] Home Phone: [phone] Mobile Phone: [phone] Relation: Son  Code Status:  Full Code Goals of care: Advanced Directive information Advanced Directives 10/08/2019  Does Patient Have a Medical Advance Directive? Yes  Type of Advance Directive (No Data)  Does patient want to make changes to medical advance directive? No - Patient declined  Copy of Healthcare Power of Attorney in Chart? -  Would patient like information on creating a medical advance directive? -     Chief Complaint  Patient presents with  . Follow-up    Follow Up  Hospitalization and recent admission for right humerus fracture  HPI:  Pt is a 74 y.o. female seen today for an acute visit for follow-up of recent hospitalization and admission to skilled nursing after hospitalization for right humerus fracture.  Apparently she went home but had increased weakness and was taking more pain medication than she was supposed to-that apparently made her groggy and more weak.  Apparently it was difficult for her to be taking care of at home and this has been admitted to skilled nursing.  She also has a diagnosis of bipolar disease this is treated with Risperdal and depression treated with Prozac  -in regards to pain she is on oxycodone 5 mg every 6 hours as needed --she also has orders for as needed Tylenol-she is on Celebrex 100 mg twice daily and Lyrica 150 mg a day She recently saw orthopedics and  their pain recommendations are being followed  Nursing does not really report any acute issues-they are trying to be judicious in managing her pain medications.  Patient herself does not really have any acute complaints today   Past Medical History:  Diagnosis Date  . Abnormal involuntary movements(781.0)   . Alopecia, unspecified   . Anemia, unspecified   . Anxiety   . Bipolar disorder, unspecified (HCC)   . Depression   . Dysuria   . Edema   . Encounter for long-term (current) use of other medications   . Esophageal reflux   . Fibromyalgia   . Hyperthyroidism   . Insomnia   . Lumbago   . Nonspecific elevation of levels of transaminase or lactic acid dehydrogenase (LDH)   . Other abnormal blood chemistry    hyperglycemia  . Other malaise and fatigue   . Pathologic fracture of vertebrae   . Reflux esophagitis   . Syncope and collapse   . Tachycardia, unspecified   . Unspecified essential hypertension   . Unspecified hypothyroidism    Past Surgical History:  Procedure Laterality Date  . BIOPSY  02/25/2018   Procedure: BIOPSY;  Surgeon: Meryl Dare, MD;  Location: [facility] ENDOSCOPY;  Service: Endoscopy;;  . CHOLECYSTECTOMY  10/2010   complicated by bile leak  . COLONOSCOPY WITH PROPOFOL N/A 02/25/2018   Procedure: COLONOSCOPY WITH PROPOFOL;  Surgeon: Meryl Dare, MD;  Location: [facility] ENDOSCOPY;  Service: Endoscopy;  Laterality: N/A;  . ERCP  11/03/2010   for bile leak  . ESOPHAGOGASTRODUODENOSCOPY (EGD) WITH  PROPOFOL N/A 02/25/2018   Procedure: ESOPHAGOGASTRODUODENOSCOPY (EGD) WITH PROPOFOL;  Surgeon: Meryl Dare, MD;  Location: [facility] ENDOSCOPY;  Service: Endoscopy;  Laterality: N/A;  . LAPAROSCOPIC TUBAL LIGATION    . TONSILLECTOMY      Allergies  Allergen Reactions  . Fetzima [Levomilnacipran] Nausea Only  . Milk-Related Compounds Other (See Comments)    Causes chest congestion after eating    Outpatient Encounter Medications as of 10/08/2019  Medication Sig  .  Abaloparatide (TYMLOS) 3120 MCG/1.56ML SOPN Inject 80 mcg into the skin every morning.  Marland Kitchen acetaminophen (TYLENOL) 500 MG tablet Take 1,000 mg by mouth every 8 (eight) hours.  Marland Kitchen amitriptyline (ELAVIL) 25 MG tablet TAKE 1 TABLET(25 MG) BY MOUTH AT BEDTIME  . celecoxib (CELEBREX) 100 MG capsule Take 100 mg by mouth 2 (two) times daily.  Marland Kitchen FLUoxetine (PROZAC) 40 MG capsule TAKE 1 CAPSULE BY MOUTH DAILY TO HELP NERVES AND DEPRESSION  . levothyroxine (SYNTHROID, LEVOTHROID) 25 MCG tablet TAKE 1 TABLET BY MOUTH EVERY DAY  . Multiple Vitamins-Minerals (ONE-A-DAY WOMENS PO) Take 1 tablet by mouth daily with breakfast.   . ondansetron (ZOFRAN-ODT) 4 MG disintegrating tablet Take 4 mg by mouth every 8 (eight) hours as needed for nausea or vomiting (DISSOLVE IN THE MOUTH).   Marland Kitchen oxyCODONE (ROXICODONE) 5 MG immediate release tablet Take 1 tablet (5 mg total) by mouth every 6 (six) hours as needed for severe pain.  . pantoprazole (PROTONIX) 40 MG tablet Take 40 mg by mouth daily.  . pregabalin (LYRICA) 150 MG capsule Take 1 capsule (150 mg total) by mouth daily.  Marland Kitchen Propylene Glycol (SYSTANE COMPLETE OP) Place 2 drops into both eyes as needed (for dryness).   . risperiDONE (RISPERDAL) 1 MG tablet Take 1 mg by mouth at bedtime.   No facility-administered encounter medications on file as of 10/08/2019.    Review of Systems   General she is not complaining of any fever or chills.  Skin does not complain of rashes or itching.  Head ears eyes nose mouth and throat does not complain of visual changes or sore throat.  Respiratory is not complaining of being short of breath or having a cough.  Cardiac does not complain of chest pain or increased edema.  GI does not complain of abdominal discomfort nausea vomiting diarrhea constipation.  GU no complaints of dysuria.  Musculoskeletal does have a history of somewhat chronic pain complaints as noted above at this point does not really complain of acute  pain.  Neurologic does not complain of dizziness headache or numbness or syncope.  And psych does have a history of bipolar disorder but does not really complain of being  anxious or depressed currently  Immunization History  Administered Date(s) Administered  . Influenza Whole 05/20/2013  . Influenza, High Dose Seasonal PF 05/30/2017  . Influenza,inj,Quad PF,6+ Mos 05/29/2014, 05/13/2015  . Influenza-Unspecified 06/03/2016, 05/08/2019  . Pneumococcal Conjugate-13 09/05/2015  . Pneumococcal Polysaccharide-23 10/13/2011  . Td 10/13/2011  . Zoster 10/20/2011   Pertinent  Health Maintenance Due  Topic Date Due  . DEXA SCAN  07/31/2010  . COLON CANCER SCREENING ANNUAL FOBT  02/25/2019  . MAMMOGRAM  06/28/2019  . COLONOSCOPY  02/26/2028  . INFLUENZA VACCINE  Completed  . PNA vac Low Risk Adult  Completed   Fall Risk  02/07/2018 01/19/2018 09/15/2017 05/19/2017 11/11/2015  Falls in the past year? Yes Yes No No No  Number falls in past yr: 2 or more 1 - - -  Injury with  Fall? - No - - -   Functional Status Survey:    Vitals:   10/08/19 1437  BP: 125/70  Pulse: 78  Resp: 18  Temp: 98 F (36.7 C)  TempSrc: Oral  SpO2: 97%  Weight: 155 lb 6.4 oz (70.5 kg)  Height: 5\' 4"  (1.626 m)   Body mass index is 26.67 kg/m. Physical Exam   In general this is a pleasant elderly female in no distress sitting comfortably in bed.  Her skin is warm and dry.  Eyes visual acuity appears to be intact sclera and conjunctive are clear.  Oropharynx is clear mucous membranes moist.  Chest is clear to auscultation there is no labored breathing.  Heart is regular rate and rhythm without murmur gallop or rub she has minimal lower extremity edema.  Abdomen is soft nontender with positive bowel sounds.  Musculoskeletal does have her right arm shoulder in a sling moves her other extremities at baseline she does have movement it appears appropriate of her right upper extremity limited of course by  the sling.  Neurologic is grossly intact cannot appreciate lateralizing findings her speech is clear.  Psych she is pleasant and appropriate quite talkative  Labs reviewed:  October 03, 2019.  WBC 5.2 hemoglobin 11.0 platelets 228.  Sodium 132 potassium 4.5 BUN 13.2 creatinine 0.73   Recent Labs    11/19/18 0236 09/12/19 2008 09/25/19 1530  NA 138 138 134*  K 3.3* 3.7 4.1  CL 110 105 100  CO2 20* 22 25  GLUCOSE 112* 103* 100*  BUN 13 26* 24*  CREATININE 1.26* 1.29* 1.43*  CALCIUM 8.1* 8.9 8.6*   Recent Labs    11/19/18 0236 09/12/19 2008 09/25/19 1530  AST 14* 21 17  ALT 9 15 12   ALKPHOS 49 49 61  BILITOT 0.4 0.5 0.7  PROT 5.2* 5.7* 5.3*  ALBUMIN 3.2* 3.3* 2.9*   Recent Labs    11/19/18 0236 09/12/19 1746 09/25/19 1530  WBC 6.8 12.2* 8.1  NEUTROABS 4.0 9.3* 5.0  HGB 12.0 14.3 11.6*  HCT 39.1 46.1* 37.6  MCV 94.2 99.1 100.0  PLT 191 216 209   Lab Results  Component Value Date   TSH 1.259 11/19/2018   Lab Results  Component Value Date   HGBA1C 4.7 05/19/2017   Lab Results  Component Value Date   CHOL 185 03/05/2013   HDL 63 03/05/2013   LDLCALC 101 (H) 03/05/2013   TRIG 103 03/05/2013   CHOLHDL 2.9 03/05/2013    Significant Diagnostic Results in last 30 days:  DG Chest 1 View  Result Date: 09/25/2019 CLINICAL DATA:  Cough, fatigue EXAM: CHEST  1 VIEW COMPARISON:  11/18/2018 FINDINGS: Moderate cardiomegaly. Large hiatal hernia. Calcified thoracic aorta. Lungs are clear without focal consolidation, pleural effusion, or pneumothorax. IMPRESSION: No acute cardiopulmonary disease. Large hiatal hernia. Electronically Signed   By: 09/27/2019 D.O.   On: 09/25/2019 16:01   CT HEAD WO CONTRAST  Result Date: 09/25/2019 CLINICAL DATA:  74 year old female with altered mental status. EXAM: CT HEAD WITHOUT CONTRAST TECHNIQUE: Contiguous axial images were obtained from the base of the skull through the vertex without intravenous contrast. COMPARISON:   Head CT dated 03/03/2009. FINDINGS: Brain: There is mild age-related atrophy and chronic microvascular ischemic changes. Probable small old left basal ganglia lacunar infarct. There is no acute intracranial hemorrhage. No mass effect or midline shift. No extra-axial fluid collection. Vascular: No hyperdense vessel or unexpected calcification. Skull: Normal. Negative for fracture or focal lesion. Sinuses/Orbits:  Mild mucoperiosteal thickening of paranasal sinuses with partial opacification of the right maxillary sinus. The mastoid air cells are clear. Other: None IMPRESSION: 1. No acute intracranial hemorrhage. 2. Age-related atrophy and chronic microvascular ischemic changes. Electronically Signed   By: Anner Crete M.D.   On: 09/25/2019 16:07   DG Chest Portable 1 View  Result Date: 09/27/2019 CLINICAL DATA:  Shortness of breath, fatigue EXAM: PORTABLE CHEST 1 VIEW COMPARISON:  09/25/2019 FINDINGS: Moderate cardiomegaly. Calcified thoracic aorta. Large hiatal hernia. No focal airspace consolidation, pleural effusion, or pneumothorax. Subacute fracture of the right humeral head and neck. IMPRESSION: 1. No active disease. 2. Large hiatal hernia. 3. Subacute fracture of the right humeral head and neck. Electronically Signed   By: Davina Poke D.O.   On: 09/27/2019 12:24   DG Humerus Right  Result Date: 09/12/2019 CLINICAL DATA:  Status post fall EXAM: RIGHT HUMERUS - 2+ VIEW COMPARISON:  None. FINDINGS: An acute nondisplaced fracture deformity is seen involving the neck of the proximal right humerus. There is no evidence of dislocation. No lytic or blastic lesions are identified. Soft tissues are unremarkable. IMPRESSION: 1. Nondisplaced fracture of the proximal right humerus. Electronically Signed   By: Virgina Norfolk M.D.   On: 09/12/2019 17:26    Assessment/Plan  #1 history of right shoulder fracture-at this point appears to be stable-she does have a history of some narcotic addiction it  appears.  Currently has oxycodone 5 mg every 6 hours as needed-there is consideration of possibly wean her down to tramadol at some point per Dr. Sheppard Coil.  She also has orders for Tylenol as needed and Celebrex 100 mg twice daily.  She has been seen recently by orthopedics  For history of fibromyalgia she continues on Lyrica 150 mg a day.  2.  History of bipolar disorder at this point appears fairly stable on Risperdal 1 mg nightly she is also on Prozac 40 mg a day.  3.  Depression again she is on Prozac 40 mg a day and for insomnia is on amitriptyline 25 mg nightly.  4.  History of hypothyroidism not stated as uncontrolled she is on Synthroid 25 mcg a day.  5.  History of osteoporosis continues onTymlos 80 mcg subcutaneous every morning she is also on vitamin D.  6.  History of GERD she is on Protonix 40 mg a day.  #7 failure to thrive-it appears that she has gained some weight will monitor.      [id number]

## 2019-10-19 ENCOUNTER — Encounter: Payer: Self-pay | Admitting: Internal Medicine

## 2019-10-19 ENCOUNTER — Non-Acute Institutional Stay: Payer: Self-pay | Admitting: Internal Medicine

## 2019-10-19 ENCOUNTER — Non-Acute Institutional Stay (SKILLED_NURSING_FACILITY): Payer: Medicare Other | Admitting: Internal Medicine

## 2019-10-19 DIAGNOSIS — K2100 Gastro-esophageal reflux disease with esophagitis, without bleeding: Secondary | ICD-10-CM

## 2019-10-19 DIAGNOSIS — F1120 Opioid dependence, uncomplicated: Secondary | ICD-10-CM | POA: Diagnosis not present

## 2019-10-19 DIAGNOSIS — M797 Fibromyalgia: Secondary | ICD-10-CM

## 2019-10-19 DIAGNOSIS — R627 Adult failure to thrive: Secondary | ICD-10-CM

## 2019-10-19 DIAGNOSIS — M818 Other osteoporosis without current pathological fracture: Secondary | ICD-10-CM

## 2019-10-19 DIAGNOSIS — Z8781 Personal history of (healed) traumatic fracture: Secondary | ICD-10-CM

## 2019-10-19 DIAGNOSIS — E039 Hypothyroidism, unspecified: Secondary | ICD-10-CM

## 2019-10-19 DIAGNOSIS — F3175 Bipolar disorder, in partial remission, most recent episode depressed: Secondary | ICD-10-CM

## 2019-10-19 DIAGNOSIS — F5105 Insomnia due to other mental disorder: Secondary | ICD-10-CM

## 2019-10-19 DIAGNOSIS — F99 Mental disorder, not otherwise specified: Secondary | ICD-10-CM

## 2019-10-19 DIAGNOSIS — F331 Major depressive disorder, recurrent, moderate: Secondary | ICD-10-CM

## 2019-10-19 DIAGNOSIS — S42201S Unspecified fracture of upper end of right humerus, sequela: Secondary | ICD-10-CM

## 2019-10-19 NOTE — Progress Notes (Addendum)
Location:   Technical sales engineer of Service:   SNF Provider:Zykeem Bauserman D Jamir Rone MD   PCP: Rodrigo Ran, MD Patient Care Team: Rodrigo Ran, MD as PCP - General (Internal Medicine)  Extended Emergency Contact Information Primary Emergency Contact: Collar,Bill Address: [address] [place], [place] [address] [place] Home Phone: [phone] Mobile Phone: [phone] Relation: Spouse Secondary Emergency Contact: Doristine [place] Home Phone: [phone] Mobile Phone: [phone] Relation: Son  Allergies  Allergen Reactions  . Fetzima [Levomilnacipran] Nausea Only  . Milk-Related Compounds Other (See Comments)    Causes chest congestion after eating    Chief Complaint  Patient presents with  . Discharge Note    HPI:  73 y.o. female with history of syncope, hypothyroidism, depression, abnormal involuntary movement, bipolar disease, who had a fall on 09/12/2019 and broke her right humerus and was discharged home.  Was at home she has not gotten out of bed she cannot walk because her legs were weak and she was taking pain medications more than she was supposed to because she says she is in pain.  The pain medicine does make her fatigued and groggy.  Patient denied any pain other than her arm and has had no fever, shortness of breath vomiting or diarrhea.  Patient states she has been refusing Deegan barely get a few sips of water.  She has been taking her meds on a regular basis according to her.  Patient's husband was unable to take care of her any longer at home and she was admitted to [facility] from the ED on 1/22 for thrive failure to thrive and opioid disuse..  Patient will be discharged to home to her husband.  Because opioid issues with 1 of patient's diagnosis and one of the reasons that she required hospitalization she has been sent home with a very limited number of oxycodone..    Past Medical History:  Diagnosis Date  .  Abnormal involuntary movements(781.0)   . Alopecia, unspecified   . Anemia, unspecified   . Anxiety   . Bipolar disorder, unspecified (HCC)   . Depression   . Dysuria   . Edema   . Encounter for long-term (current) use of other medications   . Esophageal reflux   . Fibromyalgia   . Hyperthyroidism   . Insomnia   . Lumbago   . Nonspecific elevation of levels of transaminase or lactic acid dehydrogenase (LDH)   . Other abnormal blood chemistry    hyperglycemia  . Other malaise and fatigue   . Pathologic fracture of vertebrae   . Reflux esophagitis   . Syncope and collapse   . Tachycardia, unspecified   . Unspecified essential hypertension   . Unspecified hypothyroidism     Past Surgical History:  Procedure Laterality Date  . BIOPSY  02/25/2018   Procedure: BIOPSY;  Surgeon: Meryl Dare, MD;  Location: [facility] ENDOSCOPY;  Service: Endoscopy;;  . CHOLECYSTECTOMY  10/2010   complicated by bile leak  . COLONOSCOPY WITH PROPOFOL N/A 02/25/2018   Procedure: COLONOSCOPY WITH PROPOFOL;  Surgeon: Meryl Dare, MD;  Location: [facility] ENDOSCOPY;  Service: Endoscopy;  Laterality: N/A;  . ERCP  11/03/2010   for bile leak  . ESOPHAGOGASTRODUODENOSCOPY (EGD) WITH PROPOFOL N/A 02/25/2018   Procedure: ESOPHAGOGASTRODUODENOSCOPY (EGD) WITH PROPOFOL;  Surgeon: Meryl Dare, MD;  Location: [facility] ENDOSCOPY;  Service: Endoscopy;  Laterality: N/A;  . LAPAROSCOPIC TUBAL LIGATION    .  TONSILLECTOMY       reports that she has never smoked. She has never used smokeless tobacco. She reports that she does not drink alcohol or use drugs. Social History   Socioeconomic History  . Marital status: Married    Spouse name: Not on file  . Number of children: 2  . Years of education: Not on file  . Highest education level: Not on file  Occupational History  . Occupation: retired  Tobacco Use  . Smoking status: Never Smoker  . Smokeless tobacco: Never Used  Substance and Sexual Activity  . Alcohol use:  No    Alcohol/week: 0.0 standard drinks  . Drug use: No  . Sexual activity: Never  Other Topics Concern  . Not on file  Social History Narrative  . Not on file   Social Determinants of Health   Financial Resource Strain:   . Difficulty of Paying Living Expenses: Not on file  Food Insecurity:   . Worried About Programme researcher, broadcasting/film/video in the Last Year: Not on file  . Ran Out of Food in the Last Year: Not on file  Transportation Needs:   . Lack of Transportation (Medical): Not on file  . Lack of Transportation (Non-Medical): Not on file  Physical Activity:   . Days of Exercise per Week: Not on file  . Minutes of Exercise per Session: Not on file  Stress:   . Feeling of Stress : Not on file  Social Connections:   . Frequency of Communication with Friends and Family: Not on file  . Frequency of Social Gatherings with Friends and Family: Not on file  . Attends Religious Services: Not on file  . Active Member of Clubs or Organizations: Not on file  . Attends Banker Meetings: Not on file  . Marital Status: Not on file  Intimate Partner Violence:   . Fear of Current or Ex-Partner: Not on file  . Emotionally Abused: Not on file  . Physically Abused: Not on file  . Sexually Abused: Not on file    Pertinent  Health Maintenance Due  Topic Date Due  . DEXA SCAN  07/31/2010  . COLON CANCER SCREENING ANNUAL FOBT  02/25/2019  . MAMMOGRAM  06/28/2019  . COLONOSCOPY  02/26/2028  . INFLUENZA VACCINE  Completed  . PNA vac Low Risk Adult  Completed    Medications: Allergies as of 10/19/2019      Reactions   Fetzima [levomilnacipran] Nausea Only   Milk-related Compounds Other (See Comments)   Causes chest congestion after eating      Medication List       Accurate as of October 19, 2019 11:59 PM. If you have any questions, ask your nurse or doctor.        acetaminophen 500 MG tablet Commonly known as: TYLENOL Take 1,000 mg by mouth every 8 (eight) hours.     amitriptyline 25 MG tablet Commonly known as: ELAVIL TAKE 1 TABLET(25 MG) BY MOUTH AT BEDTIME   celecoxib 100 MG capsule Commonly known as: CELEBREX Take 1 capsule (100 mg total) by mouth 2 (two) times daily.   FLUoxetine 40 MG capsule Commonly known as: PROZAC TAKE 1 CAPSULE BY MOUTH DAILY TO HELP NERVES AND DEPRESSION   gabapentin 600 MG tablet Commonly known as: NEURONTIN Take 1 tablet (600 mg total) by mouth 3 (three) times daily.   levothyroxine 25 MCG tablet Commonly known as: SYNTHROID Take 1 tablet (25 mcg total) by mouth daily.   methocarbamol  500 MG tablet Commonly known as: ROBAXIN Take 1 tablet (500 mg total) by mouth 3 (three) times daily.   ondansetron 4 MG disintegrating tablet Commonly known as: ZOFRAN-ODT Take 1 tablet (4 mg total) by mouth every 8 (eight) hours as needed for nausea or vomiting (DISSOLVE IN THE MOUTH).   ONE-A-DAY WOMENS PO Take 1 tablet by mouth daily with breakfast.   oxyCODONE 5 MG immediate release tablet Commonly known as: Roxicodone Take 1 tablet (5 mg total) by mouth every 12 (twelve) hours as needed for severe pain. What changed: when to take this Changed by: Merrilee Seashore, MD   pantoprazole 40 MG tablet Commonly known as: PROTONIX Take 1 tablet (40 mg total) by mouth daily.   pregabalin 150 MG capsule Commonly known as: Lyrica Take 1 capsule (150 mg total) by mouth daily.   risperiDONE 1 MG tablet Commonly known as: RISPERDAL Take 1 tablet (1 mg total) by mouth at bedtime.   SYSTANE COMPLETE OP Place 2 drops into both eyes as needed (for dryness).   Tymlos 3120 MCG/1.56ML Sopn Generic drug: Abaloparatide Inject 80 mcg into the skin every morning.        Vitals:   10/20/19 1308  BP: 125/70  Pulse: 78  Resp: 18  Temp: 98 F (36.7 C)  Weight: 155 lb (70.3 kg)  Height: 5\' 4"  (1.626 m)   Body mass index is 26.61 kg/m.  Physical Exam  GENERAL APPEARANCE: Alert, conversant. No acute distress.  HEENT:  Unremarkable. RESPIRATORY: Breathing is even, unlabored. Lung sounds are clear   CARDIOVASCULAR: Heart RRR no murmurs, rubs or gallops. No peripheral edema.  GASTROINTESTINAL: Abdomen is soft, non-tender, not distended w/ normal bowel sounds.  NEUROLOGIC: Cranial nerves 2-12 grossly intact. Moves all extremities   Labs reviewed: Basic Metabolic Panel: Recent Labs    11/19/18 0236 09/12/19 2008 09/25/19 1530  NA 138 138 134*  K 3.3* 3.7 4.1  CL 110 105 100  CO2 20* 22 25  GLUCOSE 112* 103* 100*  BUN 13 26* 24*  CREATININE 1.26* 1.29* 1.43*  CALCIUM 8.1* 8.9 8.6*   No results found for: [facility] Liver Function Tests: Recent Labs    11/19/18 0236 09/12/19 2008 09/25/19 1530  AST 14* 21 17  ALT 9 15 12   ALKPHOS 49 49 61  BILITOT 0.4 0.5 0.7  PROT 5.2* 5.7* 5.3*  ALBUMIN 3.2* 3.3* 2.9*   No results for input(s): LIPASE, AMYLASE in the last 8760 hours. Recent Labs    09/25/19 1530  AMMONIA 21   CBC: Recent Labs    11/19/18 0236 09/12/19 1746 09/25/19 1530  WBC 6.8 12.2* 8.1  NEUTROABS 4.0 9.3* 5.0  HGB 12.0 14.3 11.6*  HCT 39.1 46.1* 37.6  MCV 94.2 99.1 100.0  PLT 191 216 209   Lipid No results for input(s): CHOL, HDL, LDLCALC, TRIG in the last 8760 hours. Cardiac Enzymes: No results for input(s): CKTOTAL, CKMB, CKMBINDEX, TROPONINI in the last 8760 hours. BNP: No results for input(s): BNP in the last 8760 hours. CBG: Recent Labs    11/19/18 0641 09/12/19 1620  GLUCAP 70 122*    Procedures and Imaging Studies During Stay: DG Chest 1 View  Result Date: 09/25/2019 CLINICAL DATA:  Cough, fatigue EXAM: CHEST  1 VIEW COMPARISON:  11/18/2018 FINDINGS: Moderate cardiomegaly. Large hiatal hernia. Calcified thoracic aorta. Lungs are clear without focal consolidation, pleural effusion, or pneumothorax. IMPRESSION: No acute cardiopulmonary disease. Large hiatal hernia. Electronically Signed   By: 09/27/2019 D.O.  On: 09/25/2019 16:01   CT HEAD WO  CONTRAST  Result Date: 09/25/2019 CLINICAL DATA:  74 year old female with altered mental status. EXAM: CT HEAD WITHOUT CONTRAST TECHNIQUE: Contiguous axial images were obtained from the base of the skull through the vertex without intravenous contrast. COMPARISON:  Head CT dated 03/03/2009. FINDINGS: Brain: There is mild age-related atrophy and chronic microvascular ischemic changes. Probable small old left basal ganglia lacunar infarct. There is no acute intracranial hemorrhage. No mass effect or midline shift. No extra-axial fluid collection. Vascular: No hyperdense vessel or unexpected calcification. Skull: Normal. Negative for fracture or focal lesion. Sinuses/Orbits: Mild mucoperiosteal thickening of paranasal sinuses with partial opacification of the right maxillary sinus. The mastoid air cells are clear. Other: None IMPRESSION: 1. No acute intracranial hemorrhage. 2. Age-related atrophy and chronic microvascular ischemic changes. Electronically Signed   By: Anner Crete M.D.   On: 09/25/2019 16:07   DG Chest Portable 1 View  Result Date: 09/27/2019 CLINICAL DATA:  Shortness of breath, fatigue EXAM: PORTABLE CHEST 1 VIEW COMPARISON:  09/25/2019 FINDINGS: Moderate cardiomegaly. Calcified thoracic aorta. Large hiatal hernia. No focal airspace consolidation, pleural effusion, or pneumothorax. Subacute fracture of the right humeral head and neck. IMPRESSION: 1. No active disease. 2. Large hiatal hernia. 3. Subacute fracture of the right humeral head and neck. Electronically Signed   By: Davina Poke D.O.   On: 09/27/2019 12:24    Assessment/Plan:   Uncomplicated opioid dependence (HCC)  Fibromyalgia - Plan: amitriptyline (ELAVIL) 25 MG tablet, pregabalin (LYRICA) 150 MG capsule  History of right shoulder fracture  Bipolar disorder, in partial remission, most recent episode depressed (HCC)  Moderate episode of recurrent major depressive disorder ([place])  Other osteoporosis without current  pathological fracture  Hypothyroidism, unspecified type  Gastroesophageal reflux disease with esophagitis without hemorrhage  Insomnia due to other mental disorder   Patient is being discharged with the following home health services: OT/PT  "Check chest ray aide  Patient is being discharged with the following durable medical equipment: Single-point cane 6 8  Patient has been advised to f/u with their PCP in 1-2 weeks to bring them up to date on their rehab stay.  Social services at facility was responsible for arranging this appointment.  Pt was provided with a 30 day supply of prescriptions for medications and refills must be obtained from their PCP.  For controlled substances, a more limited supply may be provided adequate until PCP appointment only.  10/21/19 -called patient's pharmacy and they had not received electronically any of her medications even though epic says that they were received.  All medications were discontinued and will have to be received written and re-sent.  Hennie Duos MD

## 2019-10-20 ENCOUNTER — Encounter: Payer: Self-pay | Admitting: Internal Medicine

## 2019-10-20 DIAGNOSIS — Z8781 Personal history of (healed) traumatic fracture: Secondary | ICD-10-CM | POA: Insufficient documentation

## 2019-10-20 MED ORDER — ONDANSETRON 4 MG PO TBDP: 4.0000 mg | ORAL_TABLET | Freq: Three times a day (TID) | ORAL | 0 refills | Status: DC | PRN

## 2019-10-20 MED ORDER — GABAPENTIN 600 MG PO TABS: 600.0000 mg | ORAL_TABLET | Freq: Three times a day (TID) | ORAL | 0 refills | Status: DC

## 2019-10-20 MED ORDER — AMITRIPTYLINE HCL 25 MG PO TABS: ORAL_TABLET | ORAL | 0 refills | Status: DC

## 2019-10-20 MED ORDER — LEVOTHYROXINE SODIUM 25 MCG PO TABS: 25.0000 ug | ORAL_TABLET | Freq: Every day | ORAL | 0 refills | Status: DC

## 2019-10-20 MED ORDER — OXYCODONE HCL 5 MG PO TABS: 5.0000 mg | ORAL_TABLET | Freq: Two times a day (BID) | ORAL | 0 refills | Status: DC | PRN

## 2019-10-20 MED ORDER — METHOCARBAMOL 500 MG PO TABS: 500.0000 mg | ORAL_TABLET | Freq: Three times a day (TID) | ORAL | 0 refills | Status: DC

## 2019-10-20 MED ORDER — TYMLOS 3120 MCG/1.56ML ~~LOC~~ SOPN: 80.0000 ug | PEN_INJECTOR | SUBCUTANEOUS | 0 refills | Status: DC

## 2019-10-20 MED ORDER — CELECOXIB 100 MG PO CAPS: 100.0000 mg | ORAL_CAPSULE | Freq: Two times a day (BID) | ORAL | 0 refills | Status: DC

## 2019-10-20 MED ORDER — FLUOXETINE HCL 40 MG PO CAPS: ORAL_CAPSULE | ORAL | 0 refills | Status: DC

## 2019-10-20 MED ORDER — RISPERIDONE 1 MG PO TABS: 1.0000 mg | ORAL_TABLET | Freq: Every day | ORAL | 0 refills | Status: DC

## 2019-10-20 MED ORDER — PANTOPRAZOLE SODIUM 40 MG PO TBEC: 40.0000 mg | DELAYED_RELEASE_TABLET | Freq: Every day | ORAL | 0 refills | Status: DC

## 2019-10-20 MED ORDER — PREGABALIN 150 MG PO CAPS: 150.0000 mg | ORAL_CAPSULE | Freq: Every day | ORAL | 0 refills | Status: DC

## 2019-10-21 NOTE — Addendum Note (Signed)
Addended by: Merrilee Seashore D on: 10/21/2019 01:08 PM   Modules accepted: Orders

## 2019-10-22 ENCOUNTER — Encounter: Payer: Self-pay | Admitting: Internal Medicine

## 2019-10-22 MED ORDER — LEVOTHYROXINE SODIUM 25 MCG PO TABS: 25.0000 ug | ORAL_TABLET | Freq: Every day | ORAL | 0 refills | Status: AC

## 2019-10-22 MED ORDER — PREGABALIN 150 MG PO CAPS: 150.0000 mg | ORAL_CAPSULE | Freq: Every day | ORAL | 0 refills | Status: AC

## 2019-10-22 MED ORDER — METHOCARBAMOL 500 MG PO TABS: 500.0000 mg | ORAL_TABLET | Freq: Three times a day (TID) | ORAL | 0 refills | Status: AC

## 2019-10-22 MED ORDER — OXYCODONE HCL 5 MG PO CAPS: 5.0000 mg | ORAL_CAPSULE | Freq: Two times a day (BID) | ORAL | 0 refills | Status: AC | PRN

## 2019-10-22 MED ORDER — PANTOPRAZOLE SODIUM 40 MG PO TBEC: 40.0000 mg | DELAYED_RELEASE_TABLET | Freq: Every day | ORAL | 0 refills | Status: AC

## 2019-10-22 MED ORDER — FLUOXETINE HCL 40 MG PO CAPS: 40.0000 mg | ORAL_CAPSULE | Freq: Every day | ORAL | 0 refills | Status: AC

## 2019-10-22 MED ORDER — AMITRIPTYLINE HCL 25 MG PO TABS: 25.0000 mg | ORAL_TABLET | Freq: Every day | ORAL | 0 refills | Status: AC

## 2019-10-22 MED ORDER — ABALOPARATIDE 3120 MCG/1.56ML ~~LOC~~ SOPN: 80.0000 ug | PEN_INJECTOR | Freq: Every morning | SUBCUTANEOUS | 0 refills | Status: AC

## 2019-10-22 MED ORDER — RISPERIDONE 1 MG PO TABS: 1.0000 mg | ORAL_TABLET | Freq: Every day | ORAL | 0 refills | Status: AC

## 2019-10-22 MED ORDER — CELECOXIB 100 MG PO CAPS: 100.0000 mg | ORAL_CAPSULE | Freq: Two times a day (BID) | ORAL | 0 refills | Status: AC

## 2019-10-22 MED ORDER — ONDANSETRON 4 MG PO TBDP: 4.0000 mg | ORAL_TABLET | Freq: Three times a day (TID) | ORAL | 0 refills | Status: AC | PRN

## 2019-10-22 MED ORDER — GABAPENTIN 600 MG PO TABS: 600.0000 mg | ORAL_TABLET | Freq: Three times a day (TID) | ORAL | 0 refills | Status: AC

## 2019-10-22 NOTE — Progress Notes (Signed)
Location:  [facility] Room Number: 102-P Place of Service:  SNF (31)  PCP: Crist Infante, MD Patient Care Team: Crist Infante, MD as PCP - General (Internal Medicine)  Extended Emergency Contact Information Primary Emergency Contact: Franklyn,Bill Address: [address] [place], [place] [address] Johnnette Litter of [place] Phone: [phone] Mobile Phone: [phone] Relation: Spouse Secondary Emergency Contact: Nelda Bucks [place] of [place] Phone: [phone] Mobile Phone: [phone] Relation: Son  Allergies  Allergen Reactions  . Fetzima [Levomilnacipran] Nausea Only  . Milk-Related Compounds Other (See Comments)    Causes chest congestion after eating    Chief Complaint  Patient presents with  . Discharge Note    Patient seen for discharge from SNF    HPI:  74 y.o. female with history of syncope, hypothyroidism, depression, abnormal involuntary movement, bipolar disease who had a fall on 1/6/202020 and broke her right humerus and was discharged home.  Husband states that since then she has not gotten out of bed she cannot walk because her legs are so weak and she is taking more pain medicine than she is supposed to be.  Patient denies any localized pain other than her arm, has had no fever, shortness of breath, vomiting or diarrhea.  Patient states she has been refusing to eat and can barely get a few sips of water in.  Patient's husband was unable to take care of her any longer at home.  Patient was admitted to skilled nursing facility from the emergency department on 09/28/2019 and is now ready to be discharged to home.    Past Medical History:  Diagnosis Date  . Abnormal involuntary movements(781.0)   . Alopecia, unspecified   . Anemia, unspecified   . Anxiety   . Bipolar disorder, unspecified ([place])   . Depression   . Dysuria   . Edema   . Encounter for long-term (current) use of other medications     . Esophageal reflux   . Fibromyalgia   . Hyperthyroidism   . Insomnia   . Lumbago   . Nonspecific elevation of levels of transaminase or lactic acid dehydrogenase (LDH)   . Other abnormal blood chemistry    hyperglycemia  . Other malaise and fatigue   . Pathologic fracture of vertebrae   . Reflux esophagitis   . Syncope and collapse   . Tachycardia, unspecified   . Unspecified essential hypertension   . Unspecified hypothyroidism     Past Surgical History:  Procedure Laterality Date  . BIOPSY  02/25/2018   Procedure: BIOPSY;  Surgeon: Ladene Artist, MD;  Location: [facility] ENDOSCOPY;  Service: Endoscopy;;  . CHOLECYSTECTOMY  04/3418   complicated by bile leak  . COLONOSCOPY WITH PROPOFOL N/A 02/25/2018   Procedure: COLONOSCOPY WITH PROPOFOL;  Surgeon: Ladene Artist, MD;  Location: [facility] ENDOSCOPY;  Service: Endoscopy;  Laterality: N/A;  . ERCP  11/03/2010   for bile leak  . ESOPHAGOGASTRODUODENOSCOPY (EGD) WITH PROPOFOL N/A 02/25/2018   Procedure: ESOPHAGOGASTRODUODENOSCOPY (EGD) WITH PROPOFOL;  Surgeon: Ladene Artist, MD;  Location: [facility] ENDOSCOPY;  Service: Endoscopy;  Laterality: N/A;  . LAPAROSCOPIC TUBAL LIGATION    . TONSILLECTOMY       reports that she has never smoked. She has never used smokeless tobacco. She reports that she does not drink alcohol or use drugs. Social History   Socioeconomic History  . Marital status: Married    Spouse name: Not  on file  . Number of children: 2  . Years of education: Not on file  . Highest education level: Not on file  Occupational History  . Occupation: retired  Tobacco Use  . Smoking status: Never Smoker  . Smokeless tobacco: Never Used  Substance and Sexual Activity  . Alcohol use: No    Alcohol/week: 0.0 standard drinks  . Drug use: No  . Sexual activity: Never  Other Topics Concern  . Not on file  Social History Narrative  . Not on file   Social Determinants of Health   Financial Resource Strain:   . Difficulty of  Paying Living Expenses: Not on file  Food Insecurity:   . Worried About Programme researcher, broadcasting/film/video in the Last Year: Not on file  . Ran Out of Food in the Last Year: Not on file  Transportation Needs:   . Lack of Transportation (Medical): Not on file  . Lack of Transportation (Non-Medical): Not on file  Physical Activity:   . Days of Exercise per Week: Not on file  . Minutes of Exercise per Session: Not on file  Stress:   . Feeling of Stress : Not on file  Social Connections:   . Frequency of Communication with Friends and Family: Not on file  . Frequency of Social Gatherings with Friends and Family: Not on file  . Attends Religious Services: Not on file  . Active Member of Clubs or Organizations: Not on file  . Attends Banker Meetings: Not on file  . Marital Status: Not on file  Intimate Partner Violence:   . Fear of Current or Ex-Partner: Not on file  . Emotionally Abused: Not on file  . Physically Abused: Not on file  . Sexually Abused: Not on file    Pertinent  Health Maintenance Due  Topic Date Due  . DEXA SCAN  07/31/2010  . COLON CANCER SCREENING ANNUAL FOBT  02/25/2019  . MAMMOGRAM  06/28/2019  . COLONOSCOPY  02/26/2028  . INFLUENZA VACCINE  Completed  . PNA vac Low Risk Adult  Completed    Medications: Allergies as of 10/19/2019      Reactions   Fetzima [levomilnacipran] Nausea Only   Milk-related Compounds Other (See Comments)   Causes chest congestion after eating      Medication List       Accurate as of October 19, 2019 11:59 PM. If you have any questions, ask your nurse or doctor.        ABALOPARATIDE [place] Inject 80 mcg into the skin every morning. What changed: Another medication with the same name was removed. Continue taking this medication, and follow the directions you see here. Changed by: Merrilee Seashore, MD   acetaminophen 500 MG tablet Commonly known as: TYLENOL Take 1,000 mg by mouth every 8 (eight) hours.   amitriptyline 25 MG  tablet Commonly known as: ELAVIL Take 25 mg by mouth at bedtime. What changed: Another medication with the same name was removed. Continue taking this medication, and follow the directions you see here. Changed by: Merrilee Seashore, MD   celecoxib 100 MG capsule Commonly known as: CELEBREX Take 100 mg by mouth 2 (two) times daily. What changed: Another medication with the same name was removed. Continue taking this medication, and follow the directions you see here. Changed by: Merrilee Seashore, MD   FLUoxetine 40 MG capsule Commonly known as: PROZAC Take 40 mg by mouth daily. What changed: Another medication with the same name was removed. Continue  taking this medication, and follow the directions you see here. Changed by: Merrilee Seashore, MD   gabapentin 600 MG tablet Commonly known as: NEURONTIN Take 600 mg by mouth 3 (three) times daily. What changed: Another medication with the same name was removed. Continue taking this medication, and follow the directions you see here. Changed by: Merrilee Seashore, MD   levothyroxine 25 MCG tablet Commonly known as: SYNTHROID Take 25 mcg by mouth daily before breakfast. What changed: Another medication with the same name was removed. Continue taking this medication, and follow the directions you see here. Changed by: Merrilee Seashore, MD   methocarbamol 500 MG tablet Commonly known as: ROBAXIN Take 500 mg by mouth in the morning, at noon, and at bedtime. What changed: Another medication with the same name was removed. Continue taking this medication, and follow the directions you see here. Changed by: Merrilee Seashore, MD   ondansetron 4 MG disintegrating tablet Commonly known as: ZOFRAN-ODT Take 4 mg by mouth every 8 (eight) hours as needed for nausea or vomiting. What changed: Another medication with the same name was removed. Continue taking this medication, and follow the directions you see here. Changed by: Merrilee Seashore, MD   ONE-A-DAY  WOMENS PO Take 1 tablet by mouth daily with breakfast.   oxycodone 5 MG capsule Commonly known as: OXY-IR Take 5 mg by mouth every 6 (six) hours as needed. What changed: Another medication with the same name was removed. Continue taking this medication, and follow the directions you see here. Changed by: Merrilee Seashore, MD   pantoprazole 40 MG tablet Commonly known as: PROTONIX Take 40 mg by mouth daily. What changed: Another medication with the same name was removed. Continue taking this medication, and follow the directions you see here. Changed by: Merrilee Seashore, MD   pregabalin 150 MG capsule Commonly known as: LYRICA Take 150 mg by mouth daily. What changed: Another medication with the same name was removed. Continue taking this medication, and follow the directions you see here. Changed by: Merrilee Seashore, MD   risperiDONE 1 MG tablet Commonly known as: RISPERDAL Take 1 mg by mouth at bedtime. What changed: Another medication with the same name was removed. Continue taking this medication, and follow the directions you see here. Changed by: Merrilee Seashore, MD   SYSTANE COMPLETE OP Place 2 drops into both eyes as needed (for dryness).        Vitals:   10/22/19 0941  BP: (!) 186/85  Pulse: 74  Resp: 17  Temp: (!) 96.8 F (36 C)  TempSrc: Oral  Weight: 155 lb (70.3 kg)  Height: 5\' 4"  (1.626 m)   Body mass index is 26.61 kg/m.  Physical Exam  GENERAL APPEARANCE: Alert, conversant. No acute distress.  HEENT: Unremarkable. RESPIRATORY: Breathing is even, unlabored. Lung sounds are clear   CARDIOVASCULAR: Heart RRR no murmurs, rubs or gallops. No peripheral edema.  GASTROINTESTINAL: Abdomen is soft, non-tender, not distended w/ normal bowel sounds.  NEUROLOGIC: Cranial nerves 2-12 grossly intact. Moves all extremities   Labs reviewed: Basic Metabolic Panel: Recent Labs    11/19/18 0236 09/12/19 2008 09/25/19 1530  NA 138 138 134*  K 3.3* 3.7 4.1  CL  110 105 100  CO2 20* 22 25  GLUCOSE 112* 103* 100*  BUN 13 26* 24*  CREATININE 1.26* 1.29* 1.43*  CALCIUM 8.1* 8.9 8.6*   No results found for: [facility] Liver Function Tests: Recent Labs    11/19/18 0236 09/12/19 2008 09/25/19 1530  AST  14* 21 17  ALT 9 15 12   ALKPHOS 49 49 61  BILITOT 0.4 0.5 0.7  PROT 5.2* 5.7* 5.3*  ALBUMIN 3.2* 3.3* 2.9*   No results for input(s): LIPASE, AMYLASE in the last 8760 hours. Recent Labs    09/25/19 1530  AMMONIA 21   CBC: Recent Labs    11/19/18 0236 09/12/19 1746 09/25/19 1530  WBC 6.8 12.2* 8.1  NEUTROABS 4.0 9.3* 5.0  HGB 12.0 14.3 11.6*  HCT 39.1 46.1* 37.6  MCV 94.2 99.1 100.0  PLT 191 216 209   Lipid No results for input(s): CHOL, HDL, LDLCALC, TRIG in the last 8760 hours. Cardiac Enzymes: No results for input(s): CKTOTAL, CKMB, CKMBINDEX, TROPONINI in the last 8760 hours. BNP: No results for input(s): BNP in the last 8760 hours. CBG: Recent Labs    11/19/18 0641 09/12/19 1620  GLUCAP 70 122*    Procedures and Imaging Studies During Stay: DG Chest 1 View  Result Date: 09/25/2019 CLINICAL DATA:  Cough, fatigue EXAM: CHEST  1 VIEW COMPARISON:  11/18/2018 FINDINGS: Moderate cardiomegaly. Large hiatal hernia. Calcified thoracic aorta. Lungs are clear without focal consolidation, pleural effusion, or pneumothorax. IMPRESSION: No acute cardiopulmonary disease. Large hiatal hernia. Electronically Signed   By: 11/20/2018 D.O.   On: 09/25/2019 16:01   CT HEAD WO CONTRAST  Result Date: 09/25/2019 CLINICAL DATA:  74 year old female with altered mental status. EXAM: CT HEAD WITHOUT CONTRAST TECHNIQUE: Contiguous axial images were obtained from the base of the skull through the vertex without intravenous contrast. COMPARISON:  Head CT dated 03/03/2009. FINDINGS: Brain: There is mild age-related atrophy and chronic microvascular ischemic changes. Probable small old left basal ganglia lacunar infarct. There is no acute  intracranial hemorrhage. No mass effect or midline shift. No extra-axial fluid collection. Vascular: No hyperdense vessel or unexpected calcification. Skull: Normal. Negative for fracture or focal lesion. Sinuses/Orbits: Mild mucoperiosteal thickening of paranasal sinuses with partial opacification of the right maxillary sinus. The mastoid air cells are clear. Other: None IMPRESSION: 1. No acute intracranial hemorrhage. 2. Age-related atrophy and chronic microvascular ischemic changes. Electronically Signed   By: 03/05/2009 M.D.   On: 09/25/2019 16:07   DG Chest Portable 1 View  Result Date: 09/27/2019 CLINICAL DATA:  Shortness of breath, fatigue EXAM: PORTABLE CHEST 1 VIEW COMPARISON:  09/25/2019 FINDINGS: Moderate cardiomegaly. Calcified thoracic aorta. Large hiatal hernia. No focal airspace consolidation, pleural effusion, or pneumothorax. Subacute fracture of the right humeral head and neck. IMPRESSION: 1. No active disease. 2. Large hiatal hernia. 3. Subacute fracture of the right humeral head and neck. Electronically Signed   By: 09/27/2019 D.O.   On: 09/27/2019 12:24    Assessment/Plan:    Right shoulder fracture/failure to thrive  Narcotic overuse/addiction  Bipolar disorder/depression  Fibromyalgia  Osteoporosis  Hypothyroidism  GERD  Insomnia  Patient is being discharged with the following home health services: OT/PT/aide  Patient is being discharged with the following durable medical equipment: Single-point cane  Patient has been advised to f/u with their PCP in 1-2 weeks to bring them up to date on their rehab stay.  Social services at facility was responsible for arranging this appointment.  Pt was provided with a 30 day supply of prescriptions for medications and refills must be obtained from their PCP.  For controlled substances, a more limited supply may be provided adequate until PCP appointment only.  Future labs/tests needed:    09/29/2019  MD

## 2019-10-22 NOTE — Progress Notes (Deleted)
Location:  [facility] Room Number: 102-P Place of Service:  SNF (31)  PCP: Crist Infante, MD Patient Care Team: Crist Infante, MD as PCP - General (Internal Medicine)  Extended Emergency Contact Information Primary Emergency Contact: Branaman,Bill Address: [address] [place], [place] [address] Johnnette Litter of [place] Phone: [phone] Mobile Phone: [phone] Relation: Spouse Secondary Emergency Contact: Nelda Bucks [place] of [place] Phone: [phone] Mobile Phone: [phone] Relation: Son  Allergies  Allergen Reactions  . Fetzima [Levomilnacipran] Nausea Only  . Milk-Related Compounds Other (See Comments)    Causes chest congestion after eating    Chief Complaint  Patient presents with  . Discharge Note    Patient is seen for discharge from SNF    HPI:  74 y.o. female      Past Medical History:  Diagnosis Date  . Abnormal involuntary movements(781.0)   . Alopecia, unspecified   . Anemia, unspecified   . Anxiety   . Bipolar disorder, unspecified ([place])   . Depression   . Dysuria   . Edema   . Encounter for long-term (current) use of other medications   . Esophageal reflux   . Fibromyalgia   . Hyperthyroidism   . Insomnia   . Lumbago   . Nonspecific elevation of levels of transaminase or lactic acid dehydrogenase (LDH)   . Other abnormal blood chemistry    hyperglycemia  . Other malaise and fatigue   . Pathologic fracture of vertebrae   . Reflux esophagitis   . Syncope and collapse   . Tachycardia, unspecified   . Unspecified essential hypertension   . Unspecified hypothyroidism     Past Surgical History:  Procedure Laterality Date  . BIOPSY  02/25/2018   Procedure: BIOPSY;  Surgeon: Ladene Artist, MD;  Location: [facility] ENDOSCOPY;  Service: Endoscopy;;  . CHOLECYSTECTOMY  02/2693   complicated by bile leak  . COLONOSCOPY WITH PROPOFOL N/A 02/25/2018   Procedure: COLONOSCOPY  WITH PROPOFOL;  Surgeon: Ladene Artist, MD;  Location: [facility] ENDOSCOPY;  Service: Endoscopy;  Laterality: N/A;  . ERCP  11/03/2010   for bile leak  . ESOPHAGOGASTRODUODENOSCOPY (EGD) WITH PROPOFOL N/A 02/25/2018   Procedure: ESOPHAGOGASTRODUODENOSCOPY (EGD) WITH PROPOFOL;  Surgeon: Ladene Artist, MD;  Location: [facility] ENDOSCOPY;  Service: Endoscopy;  Laterality: N/A;  . LAPAROSCOPIC TUBAL LIGATION    . TONSILLECTOMY       reports that she has never smoked. She has never used smokeless tobacco. She reports that she does not drink alcohol or use drugs. Social History   Socioeconomic History  . Marital status: Married    Spouse name: Not on file  . Number of children: 2  . Years of education: Not on file  . Highest education level: Not on file  Occupational History  . Occupation: retired  Tobacco Use  . Smoking status: Never Smoker  . Smokeless tobacco: Never Used  Substance and Sexual Activity  . Alcohol use: No    Alcohol/week: 0.0 standard drinks  . Drug use: No  . Sexual activity: Never  Other Topics Concern  . Not on file  Social History Narrative  . Not on file   Social Determinants of Health   Financial Resource Strain:   . Difficulty of Paying Living Expenses: Not on file  Food Insecurity:   . Worried About Charity fundraiser in the Last Year: Not on file  . Ran  Out of Food in the Last Year: Not on file  Transportation Needs:   . Lack of Transportation (Medical): Not on file  . Lack of Transportation (Non-Medical): Not on file  Physical Activity:   . Days of Exercise per Week: Not on file  . Minutes of Exercise per Session: Not on file  Stress:   . Feeling of Stress : Not on file  Social Connections:   . Frequency of Communication with Friends and Family: Not on file  . Frequency of Social Gatherings with Friends and Family: Not on file  . Attends Religious Services: Not on file  . Active Member of Clubs or Organizations: Not on file  . Attends Tax inspector Meetings: Not on file  . Marital Status: Not on file  Intimate Partner Violence:   . Fear of Current or Ex-Partner: Not on file  . Emotionally Abused: Not on file  . Physically Abused: Not on file  . Sexually Abused: Not on file    Pertinent  Health Maintenance Due  Topic Date Due  . DEXA SCAN  07/31/2010  . COLON CANCER SCREENING ANNUAL FOBT  02/25/2019  . MAMMOGRAM  06/28/2019  . COLONOSCOPY  02/26/2028  . INFLUENZA VACCINE  Completed  . PNA vac Low Risk Adult  Completed    Medications: Allergies as of 10/22/2019      Reactions   Fetzima [levomilnacipran] Nausea Only   Milk-related Compounds Other (See Comments)   Causes chest congestion after eating      Medication List       Accurate as of October 22, 2019  9:39 AM. If you have any questions, ask your nurse or doctor.        ABALOPARATIDE [place] Inject 80 mcg into the skin every morning.   acetaminophen 500 MG tablet Commonly known as: TYLENOL Take 1,000 mg by mouth every 8 (eight) hours.   amitriptyline 25 MG tablet Commonly known as: ELAVIL Take 25 mg by mouth at bedtime.   celecoxib 100 MG capsule Commonly known as: CELEBREX Take 100 mg by mouth 2 (two) times daily.   FLUoxetine 40 MG capsule Commonly known as: PROZAC Take 40 mg by mouth daily.   gabapentin 600 MG tablet Commonly known as: NEURONTIN Take 600 mg by mouth 3 (three) times daily.   levothyroxine 25 MCG tablet Commonly known as: SYNTHROID Take 25 mcg by mouth daily before breakfast.   methocarbamol 500 MG tablet Commonly known as: ROBAXIN Take 500 mg by mouth in the morning, at noon, and at bedtime.   ondansetron 4 MG disintegrating tablet Commonly known as: ZOFRAN-ODT Take 4 mg by mouth every 8 (eight) hours as needed for nausea or vomiting.   ONE-A-DAY WOMENS PO Take 1 tablet by mouth daily with breakfast.   oxycodone 5 MG capsule Commonly known as: OXY-IR Take 5 mg by mouth every 6 (six) hours as needed.     pantoprazole 40 MG tablet Commonly known as: PROTONIX Take 40 mg by mouth daily.   pregabalin 150 MG capsule Commonly known as: LYRICA Take 150 mg by mouth daily.   risperiDONE 1 MG tablet Commonly known as: RISPERDAL Take 1 mg by mouth at bedtime.   SYSTANE COMPLETE OP Place 2 drops into both eyes as needed (for dryness).        Vitals:   10/22/19 0910  BP: (!) 186/85  Pulse: 74  Resp: 17  Temp: (!) 96.8 F (36 C)  TempSrc: Oral  Weight: 155 lb (70.3 kg)  Height: 5\' 4"  (1.626 m)   Body mass index is 26.61 kg/m.  Physical Exam  GENERAL APPEARANCE: Alert, conversant. No acute distress.  HEENT: Unremarkable. RESPIRATORY: Breathing is even, unlabored. Lung sounds are clear   CARDIOVASCULAR: Heart RRR no murmurs, rubs or gallops. No peripheral edema.  GASTROINTESTINAL: Abdomen is soft, non-tender, not distended w/ normal bowel sounds.  NEUROLOGIC: Cranial nerves 2-12 grossly intact. Moves all extremities   Labs reviewed: Basic Metabolic Panel: Recent Labs    11/19/18 0236 09/12/19 2008 09/25/19 1530  NA 138 138 134*  K 3.3* 3.7 4.1  CL 110 105 100  CO2 20* 22 25  GLUCOSE 112* 103* 100*  BUN 13 26* 24*  CREATININE 1.26* 1.29* 1.43*  CALCIUM 8.1* 8.9 8.6*   No results found for: [facility] Liver Function Tests: Recent Labs    11/19/18 0236 09/12/19 2008 09/25/19 1530  AST 14* 21 17  ALT 9 15 12   ALKPHOS 49 49 61  BILITOT 0.4 0.5 0.7  PROT 5.2* 5.7* 5.3*  ALBUMIN 3.2* 3.3* 2.9*   No results for input(s): LIPASE, AMYLASE in the last 8760 hours. Recent Labs    09/25/19 1530  AMMONIA 21   CBC: Recent Labs    11/19/18 0236 09/12/19 1746 09/25/19 1530  WBC 6.8 12.2* 8.1  NEUTROABS 4.0 9.3* 5.0  HGB 12.0 14.3 11.6*  HCT 39.1 46.1* 37.6  MCV 94.2 99.1 100.0  PLT 191 216 209   Lipid No results for input(s): CHOL, HDL, LDLCALC, TRIG in the last 8760 hours. Cardiac Enzymes: No results for input(s): CKTOTAL, CKMB, CKMBINDEX, TROPONINI in  the last 8760 hours. BNP: No results for input(s): BNP in the last 8760 hours. CBG: Recent Labs    11/19/18 0641 09/12/19 1620  GLUCAP 70 122*    Procedures and Imaging Studies During Stay: DG Chest 1 View  Result Date: 09/25/2019 CLINICAL DATA:  Cough, fatigue EXAM: CHEST  1 VIEW COMPARISON:  11/18/2018 FINDINGS: Moderate cardiomegaly. Large hiatal hernia. Calcified thoracic aorta. Lungs are clear without focal consolidation, pleural effusion, or pneumothorax. IMPRESSION: No acute cardiopulmonary disease. Large hiatal hernia. Electronically Signed   By: 09/27/2019 D.O.   On: 09/25/2019 16:01   CT HEAD WO CONTRAST  Result Date: 09/25/2019 CLINICAL DATA:  74 year old female with altered mental status. EXAM: CT HEAD WITHOUT CONTRAST TECHNIQUE: Contiguous axial images were obtained from the base of the skull through the vertex without intravenous contrast. COMPARISON:  Head CT dated 03/03/2009. FINDINGS: Brain: There is mild age-related atrophy and chronic microvascular ischemic changes. Probable small old left basal ganglia lacunar infarct. There is no acute intracranial hemorrhage. No mass effect or midline shift. No extra-axial fluid collection. Vascular: No hyperdense vessel or unexpected calcification. Skull: Normal. Negative for fracture or focal lesion. Sinuses/Orbits: Mild mucoperiosteal thickening of paranasal sinuses with partial opacification of the right maxillary sinus. The mastoid air cells are clear. Other: None IMPRESSION: 1. No acute intracranial hemorrhage. 2. Age-related atrophy and chronic microvascular ischemic changes. Electronically Signed   By: 66 M.D.   On: 09/25/2019 16:07   DG Chest Portable 1 View  Result Date: 09/27/2019 CLINICAL DATA:  Shortness of breath, fatigue EXAM: PORTABLE CHEST 1 VIEW COMPARISON:  09/25/2019 FINDINGS: Moderate cardiomegaly. Calcified thoracic aorta. Large hiatal hernia. No focal airspace consolidation, pleural effusion, or  pneumothorax. Subacute fracture of the right humeral head and neck. IMPRESSION: 1. No active disease. 2. Large hiatal hernia. 3. Subacute fracture of the right humeral head and neck. Electronically Signed  By: Duanne Guess D.O.   On: 09/27/2019 12:24    Assessment/Plan:   No diagnosis found.   Patient is being discharged with the following home health services:    Patient is being discharged with the following durable medical equipment:    Patient has been advised to f/u with their PCP in 1-2 weeks to bring them up to date on their rehab stay.  Social services at facility was responsible for arranging this appointment.  Pt was provided with a 30 day supply of prescriptions for medications and refills must be obtained from their PCP.  For controlled substances, a more limited supply may be provided adequate until PCP appointment only.  Future labs/tests needed:    Margit Hanks MD

## 2019-10-22 NOTE — Progress Notes (Signed)
This encounter was created in error - please disregard.

## 2019-10-23 ENCOUNTER — Encounter: Payer: Self-pay | Admitting: Internal Medicine

## 2019-11-15 ENCOUNTER — Other Ambulatory Visit: Payer: Self-pay | Admitting: Internal Medicine

## 2019-11-30 ENCOUNTER — Other Ambulatory Visit: Payer: Self-pay | Admitting: Internal Medicine

## 2019-12-23 ENCOUNTER — Other Ambulatory Visit: Payer: Self-pay | Admitting: Internal Medicine

## 2020-06-03 ENCOUNTER — Other Ambulatory Visit: Payer: Self-pay | Admitting: Internal Medicine

## 2020-06-03 DIAGNOSIS — E785 Hyperlipidemia, unspecified: Secondary | ICD-10-CM

## 2020-06-12 ENCOUNTER — Other Ambulatory Visit: Payer: Self-pay | Admitting: Internal Medicine

## 2020-06-12 DIAGNOSIS — Z1231 Encounter for screening mammogram for malignant neoplasm of breast: Secondary | ICD-10-CM

## 2020-06-18 ENCOUNTER — Other Ambulatory Visit: Payer: Medicare Other

## 2020-06-20 ENCOUNTER — Other Ambulatory Visit: Payer: Self-pay

## 2020-06-20 ENCOUNTER — Encounter: Payer: Self-pay | Admitting: Podiatry

## 2020-06-20 ENCOUNTER — Ambulatory Visit: Payer: Medicare Other | Admitting: Podiatry

## 2020-06-20 DIAGNOSIS — M779 Enthesopathy, unspecified: Secondary | ICD-10-CM | POA: Diagnosis not present

## 2020-06-20 DIAGNOSIS — L84 Corns and callosities: Secondary | ICD-10-CM | POA: Diagnosis not present

## 2020-06-24 NOTE — Progress Notes (Signed)
Subjective:   Patient ID: Sarah Miranda, female   DOB: 74 y.o.   MRN: [id number]   HPI Patient presents stating she has pain around the fifth metatarsal head right that is inflamed with fluid buildup and has a lesion formation that has been painful.  Patient states that she is tried to trim and pad it without relief and is tried different shoe gear and at this time does not smoke and likes to be active   Review of Systems  All other systems reviewed and are negative.       Objective:  Physical Exam Vitals and nursing note reviewed.  Constitutional:      Appearance: She is well-developed.  Pulmonary:     Effort: Pulmonary effort is normal.  Musculoskeletal:        General: Normal range of motion.  Skin:    General: Skin is warm.  Neurological:     Mental Status: She is alert.     Neurovascular status was found to be intact muscle strength was found to be adequate range of motion within normal limits.  I found there to be inflammation fluid around the fifth metatarsal head with pain upon palpation to the area and porokeratotic keratotic lesion formation that is painful when pressed with structural deformity noted.  Patient has good digital perfusion well oriented x3     Assessment:  A capsulitis of the fifth MPJ right with pain along with lesion formation that is painful     Plan:  H&P and both conditions discussed.  I did discuss the structure of the metatarsal and pressure on the underlying tissue and today I did sterile prep and injected the MPJ 3 mg dexamethasone 5 mg Xylocaine and then did deep debridement of lesion with no iatrogenic bleeding and applied padding to take pressure and discussed shoe gear modifications possible orthotics in future.  Reappoint to recheck  X-rays indicated that there is some abnormalities but I did not note advanced arthritis stress fracture formation

## 2020-07-02 ENCOUNTER — Ambulatory Visit
Admission: RE | Admit: 2020-07-02 | Discharge: 2020-07-02 | Disposition: A | Discharge status: Home / Self Care | Payer: Medicare Other | Source: Ambulatory Visit | Attending: Internal Medicine | Admitting: Internal Medicine

## 2020-07-02 DIAGNOSIS — E785 Hyperlipidemia, unspecified: Secondary | ICD-10-CM

## 2020-07-04 ENCOUNTER — Ambulatory Visit: Payer: Medicare Other

## 2020-08-12 ENCOUNTER — Ambulatory Visit: Payer: Medicare Other

## 2020-09-01 IMAGING — CT CT HEAD W/O CM
4 series · 16 of 47 positions shown, 18 images · non-contrast
Comparison: Head CT dated 03/03/2009.

CLINICAL DATA: 73-year-old female with altered mental status.

EXAM:
CT HEAD WITHOUT CONTRAST
TECHNIQUE: Contiguous axial images were obtained from the base of the skull
through the vertex without intravenous contrast.

[Series 3: head without · axial · non-contrast · 0.43mm/px · z∈[-165,-45]mm · 7 of 34 slices shown, 9 images]
[im 5/34  brain]
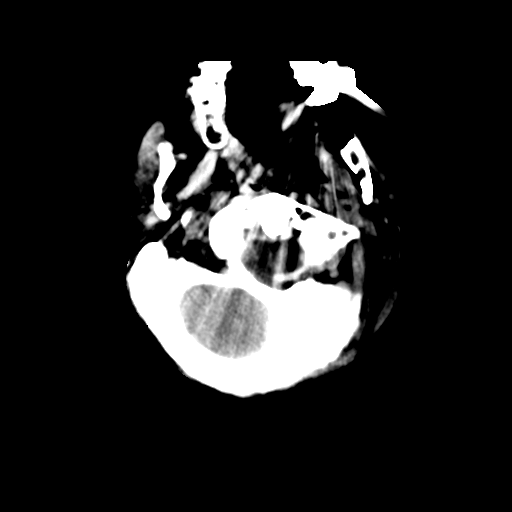
[im 5/34  bone]
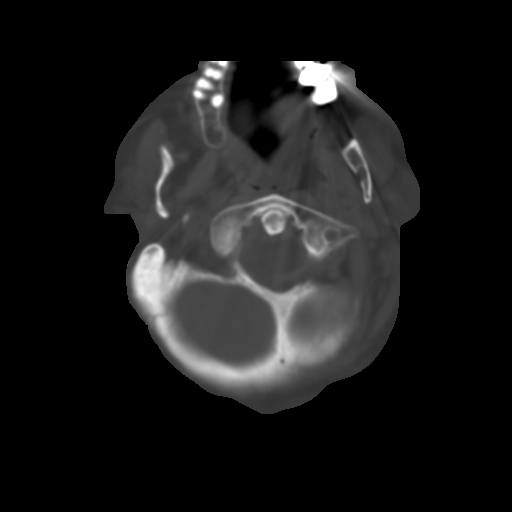
[im 9/34  brain]
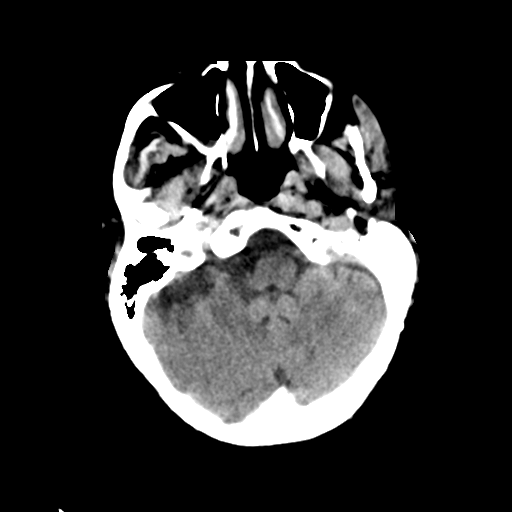
[im 13/34  brain]
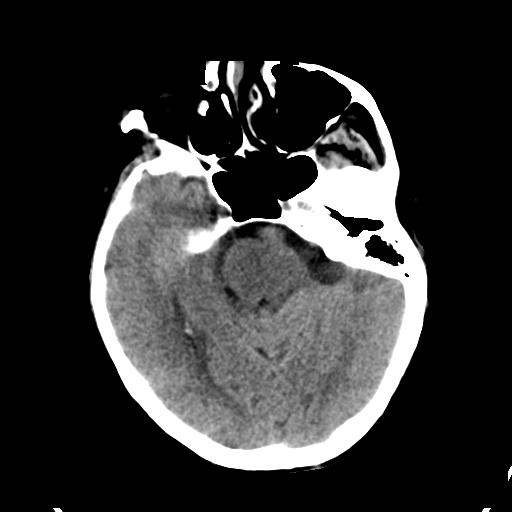
[im 17/34  brain]
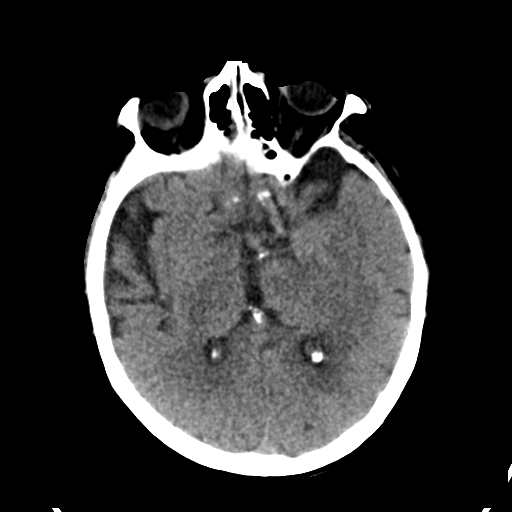
[im 21/34  brain]
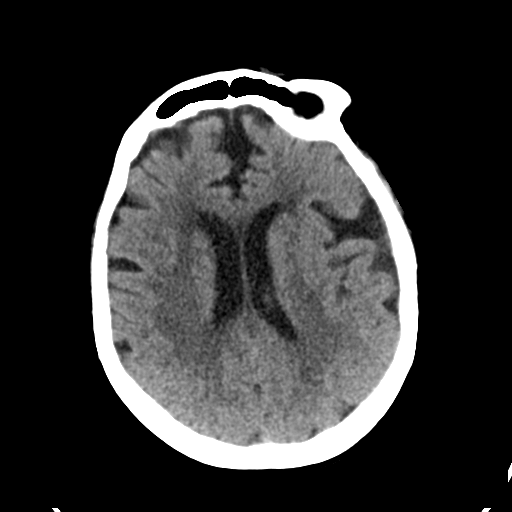
[im 21/34  bone]
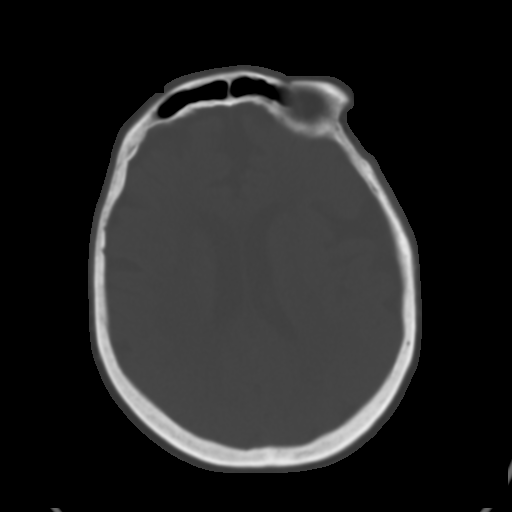
[im 25/34  brain]
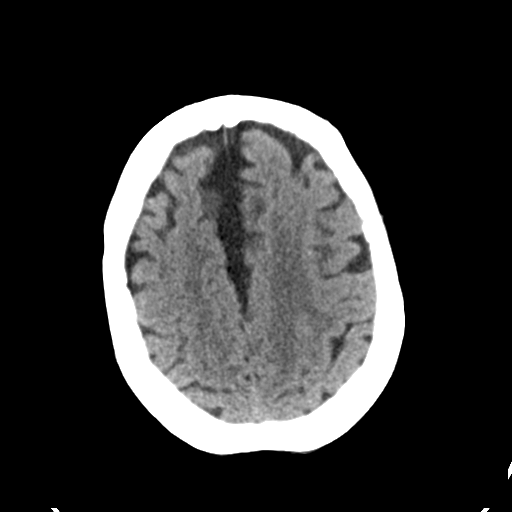
[im 29/34  brain]
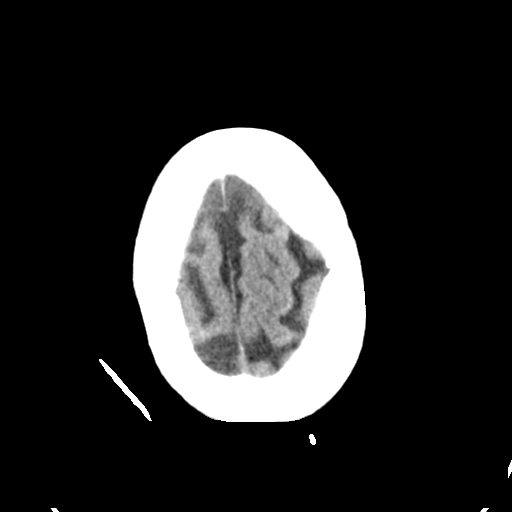

[Series 4: head bone · axial · 0.43mm/px · z∈[-169,-135]mm · 3 of 85 slices shown]
[im 9/85  bone]
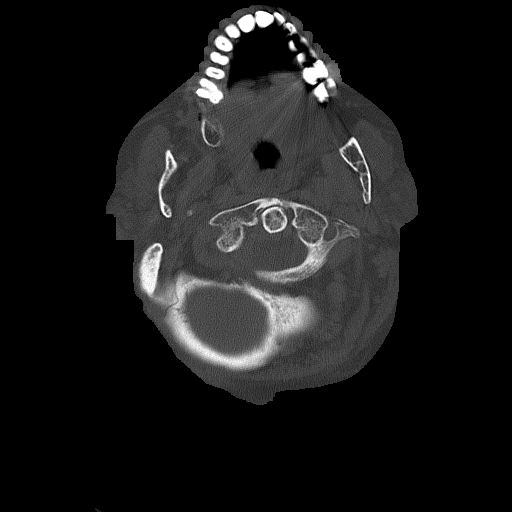
[im 17/85  bone]
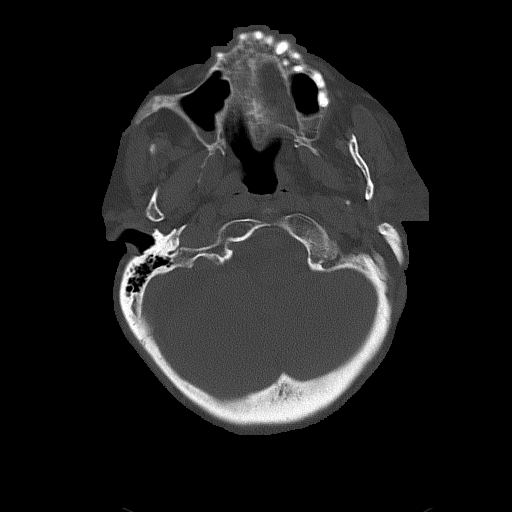
[im 26/85  bone]
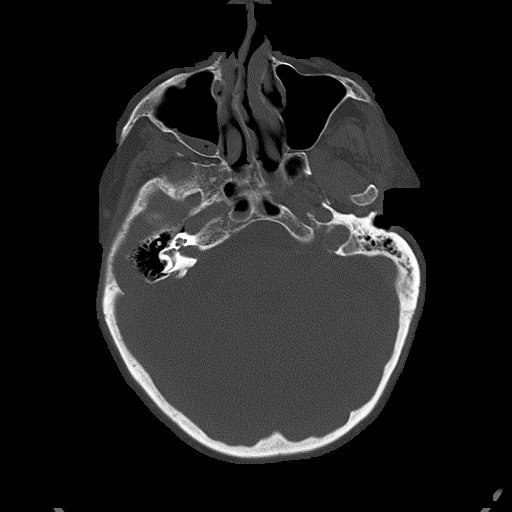

[Series 5: head without cor · coronal · non-contrast · 0.32mm/px · 3 of 67 slices shown]
[im 23/67  brain]
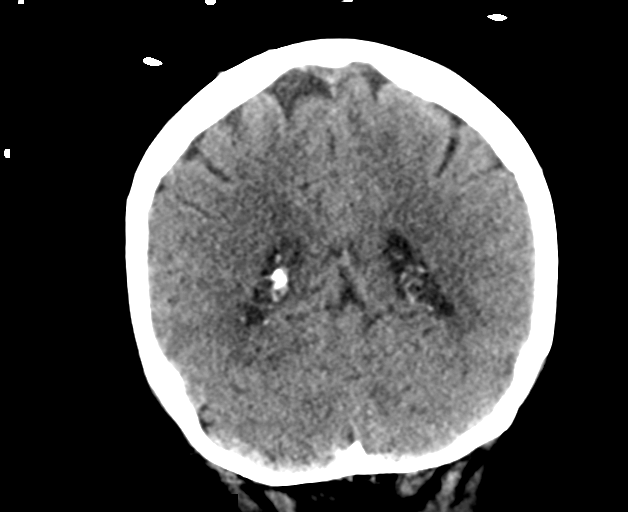
[im 30/67  brain]
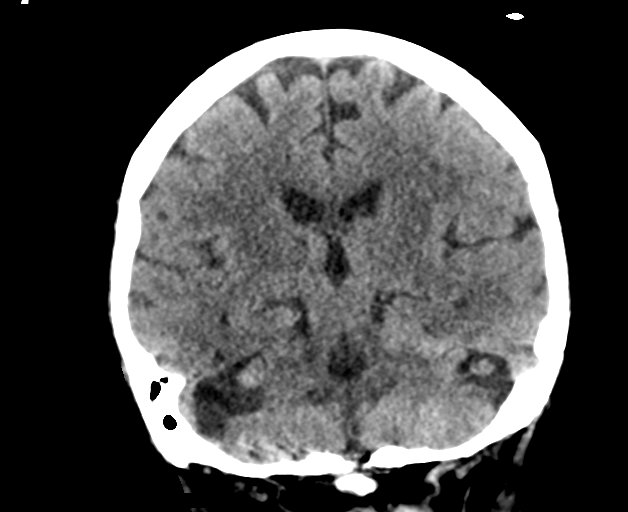
[im 37/67  brain]
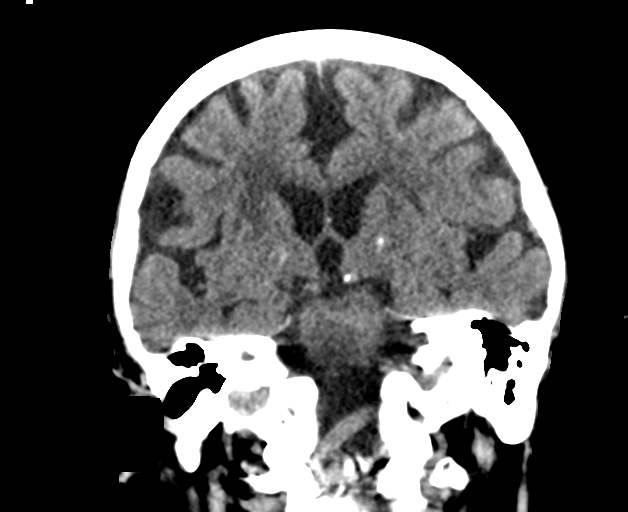

[Series 6: head without sag · sagittal · non-contrast · 0.31mm/px · 3 of 61 slices shown]
[im 21/61  brain]
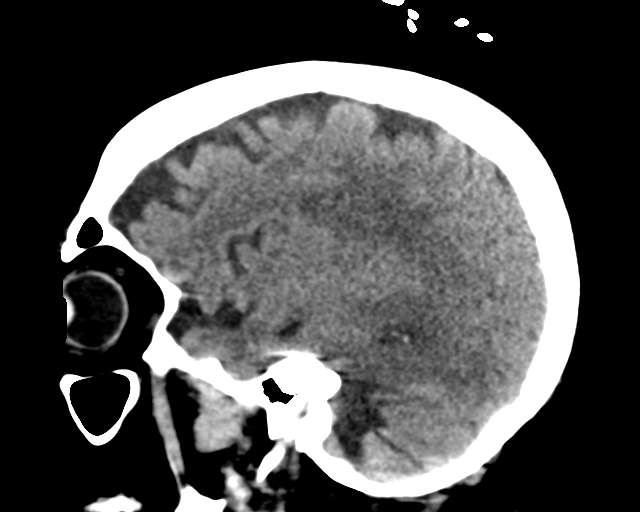
[im 31/61  brain]
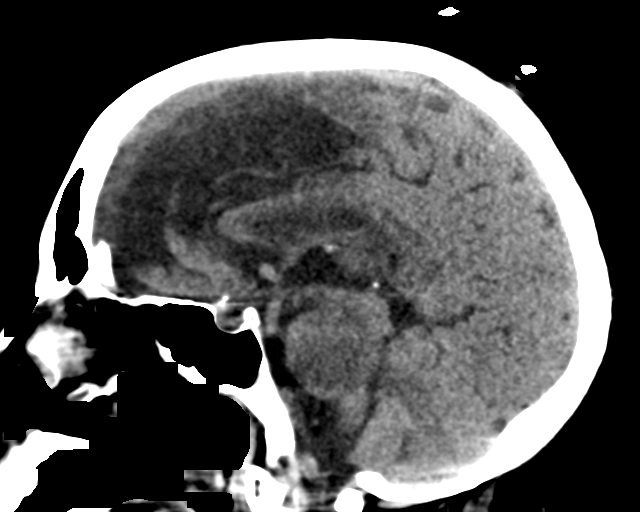
[im 41/61  brain]
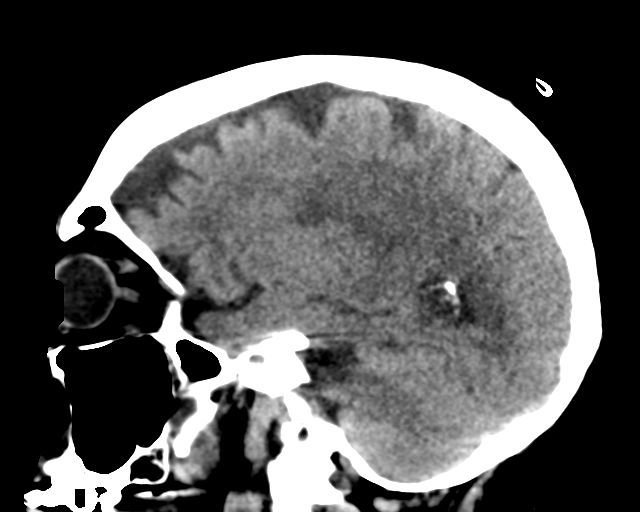

[16 of 47 positions shown; findings below may reference images not displayed]

FINDINGS: Brain: There is mild age-related atrophy and chronic microvascular
ischemic changes. Probable small old left basal ganglia lacunar
infarct. There is no acute intracranial hemorrhage. No mass effect
or midline shift. No extra-axial fluid collection.

Vascular: No hyperdense vessel or unexpected calcification.

Skull: Normal. Negative for fracture or focal lesion.

Sinuses/Orbits: Mild mucoperiosteal thickening of paranasal sinuses
with partial opacification of the right maxillary sinus. The mastoid
air cells are clear.

Other: None
IMPRESSION: 1. No acute intracranial hemorrhage.
2. Age-related atrophy and chronic microvascular ischemic changes.

## 2020-09-03 IMAGING — DX DG CHEST 1V PORT
1 series · 1 of 1 positions shown · non-contrast
Comparison: 09/25/2019

CLINICAL DATA: Shortness of breath, fatigue

EXAM:
PORTABLE CHEST 1 VIEW

[chest]
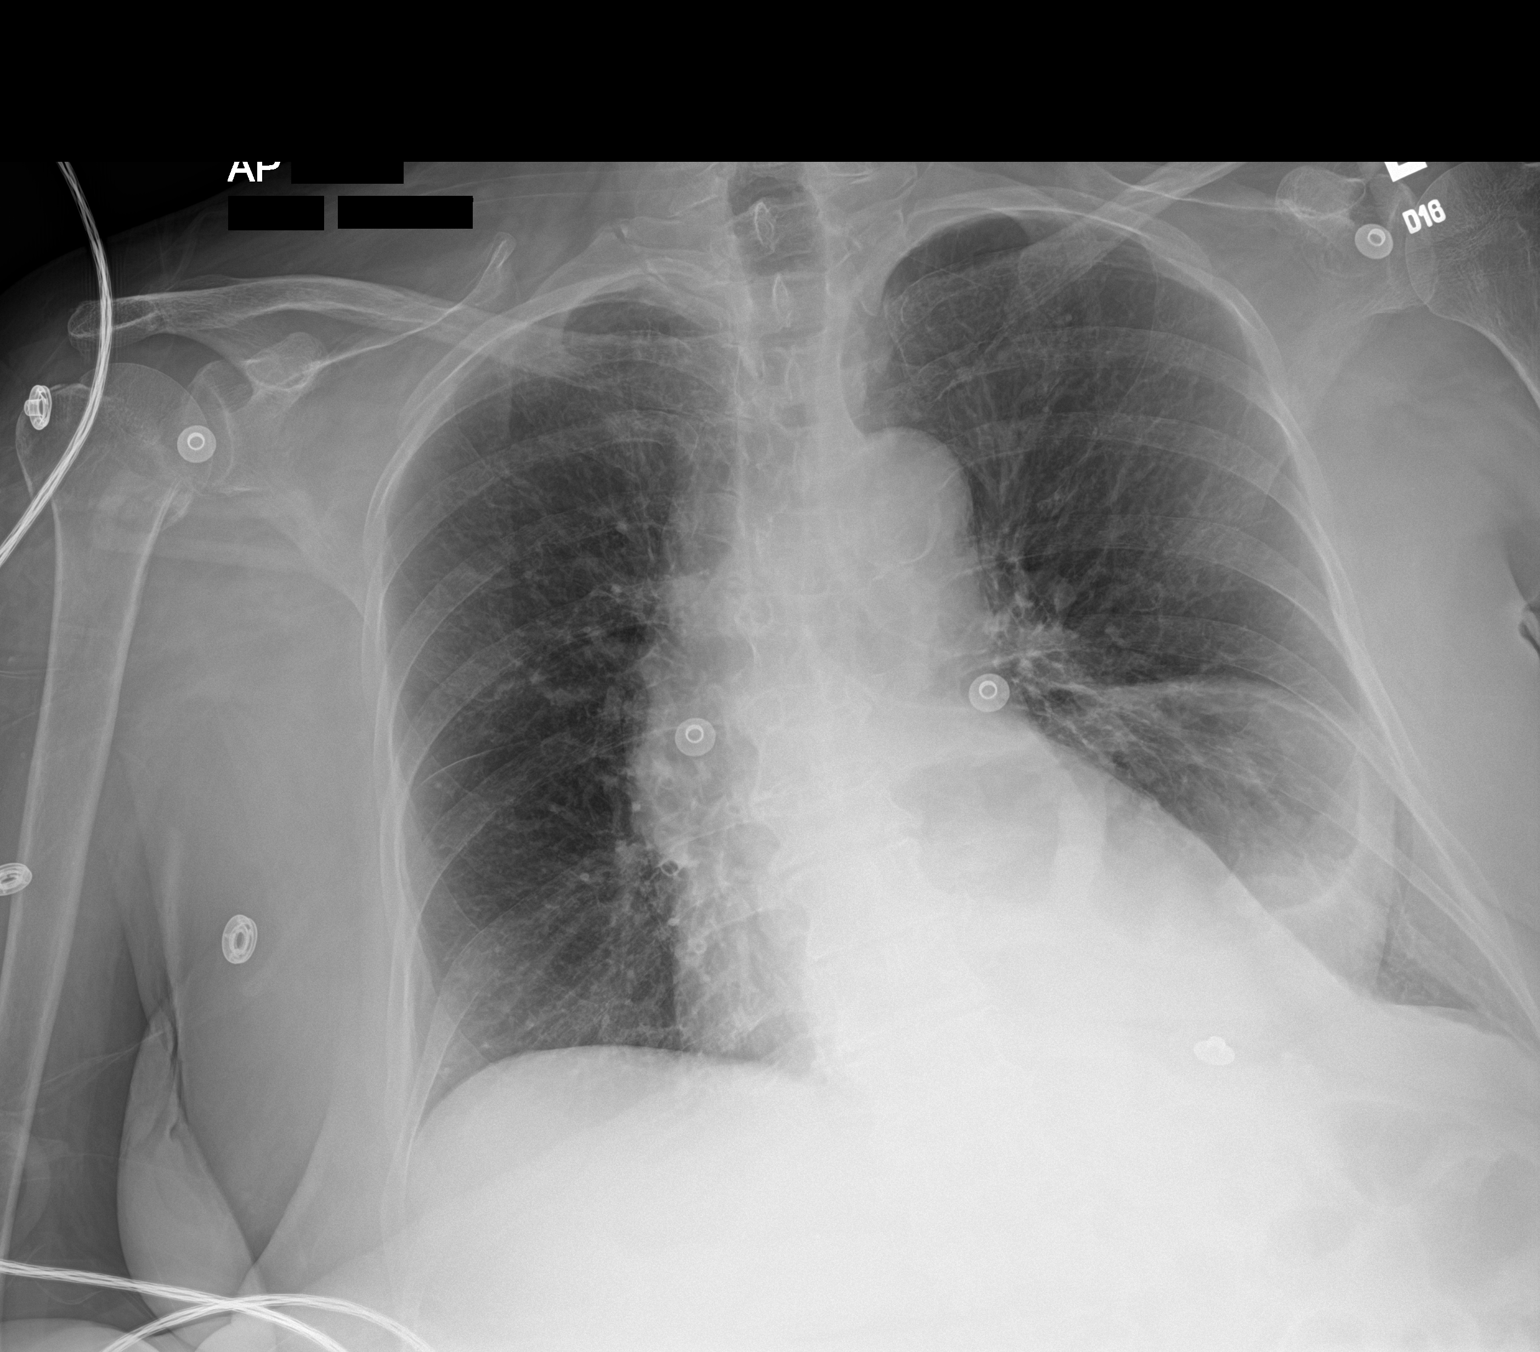

[1 of 1 positions shown; findings below may reference images not displayed]

FINDINGS: Moderate cardiomegaly. Calcified thoracic aorta. Large hiatal
hernia. No focal airspace consolidation, pleural effusion, or
pneumothorax. Subacute fracture of the right humeral head and neck.
IMPRESSION: 1. No active disease.
2. Large hiatal hernia.
3. Subacute fracture of the right humeral head and neck.

## 2020-09-24 ENCOUNTER — Ambulatory Visit: Payer: Medicare Other

## 2020-11-05 ENCOUNTER — Ambulatory Visit: Payer: Medicare Other

## 2020-11-25 ENCOUNTER — Other Ambulatory Visit (HOSPITAL_COMMUNITY): Payer: Self-pay | Admitting: *Deleted

## 2020-11-25 NOTE — Discharge Instructions (Signed)
Denosumab injection What is this medicine? DENOSUMAB (den oh sue mab) slows bone breakdown. Prolia is used to treat osteoporosis in women after menopause and in men, and in people who are taking corticosteroids for 6 months or more. Xgeva is used to treat a high calcium level due to cancer and to prevent bone fractures and other bone problems caused by multiple myeloma or cancer bone metastases. Xgeva is also used to treat giant cell tumor of the bone. This medicine may be used for other purposes; ask your health care provider or pharmacist if you have questions. COMMON BRAND NAME(S): Prolia, XGEVA What should I tell my health care provider before I take this medicine? They need to know if you have any of these conditions:  dental disease  having surgery or tooth extraction  infection  kidney disease  low levels of calcium or Vitamin D in the blood  malnutrition  on hemodialysis  skin conditions or sensitivity  thyroid or parathyroid disease  an unusual reaction to denosumab, other medicines, foods, dyes, or preservatives  pregnant or trying to get pregnant  breast-feeding How should I use this medicine? This medicine is for injection under the skin. It is given by a health care professional in a hospital or clinic setting. A special MedGuide will be given to you before each treatment. Be sure to read this information carefully each time. For Prolia, talk to your pediatrician regarding the use of this medicine in children. Special care may be needed. For Xgeva, talk to your pediatrician regarding the use of this medicine in children. While this drug may be prescribed for children as young as 13 years for selected conditions, precautions do apply. Overdosage: If you think you have taken too much of this medicine contact a poison control center or emergency room at once. NOTE: This medicine is only for you. Do not share this medicine with others. What if I miss a dose? It is  important not to miss your dose. Call your doctor or health care professional if you are unable to keep an appointment. What may interact with this medicine? Do not take this medicine with any of the following medications:  other medicines containing denosumab This medicine may also interact with the following medications:  medicines that lower your chance of fighting infection  steroid medicines like prednisone or cortisone This list may not describe all possible interactions. Give your health care provider a list of all the medicines, herbs, non-prescription drugs, or dietary supplements you use. Also tell them if you smoke, drink alcohol, or use illegal drugs. Some items may interact with your medicine. What should I watch for while using this medicine? Visit your doctor or health care professional for regular checks on your progress. Your doctor or health care professional may order blood tests and other tests to see how you are doing. Call your doctor or health care professional for advice if you get a fever, chills or sore throat, or other symptoms of a cold or flu. Do not treat yourself. This drug may decrease your body's ability to fight infection. Try to avoid being around people who are sick. You should make sure you get enough calcium and vitamin D while you are taking this medicine, unless your doctor tells you not to. Discuss the foods you eat and the vitamins you take with your health care professional. See your dentist regularly. Brush and floss your teeth as directed. Before you have any dental work done, tell your dentist you are   receiving this medicine. Do not become pregnant while taking this medicine or for 5 months after stopping it. Talk with your doctor or health care professional about your birth control options while taking this medicine. Women should inform their doctor if they wish to become pregnant or think they might be pregnant. There is a potential for serious side  effects to an unborn child. Talk to your health care professional or pharmacist for more information. What side effects may I notice from receiving this medicine? Side effects that you should report to your doctor or health care professional as soon as possible:  allergic reactions like skin rash, itching or hives, swelling of the face, lips, or tongue  bone pain  breathing problems  dizziness  jaw pain, especially after dental work  redness, blistering, peeling of the skin  signs and symptoms of infection like fever or chills; cough; sore throat; pain or trouble passing urine  signs of low calcium like fast heartbeat, muscle cramps or muscle pain; pain, tingling, numbness in the hands or feet; seizures  unusual bleeding or bruising  unusually weak or tired Side effects that usually do not require medical attention (report to your doctor or health care professional if they continue or are bothersome):  constipation  diarrhea  headache  joint pain  loss of appetite  muscle pain  runny nose  tiredness  upset stomach This list may not describe all possible side effects. Call your doctor for medical advice about side effects. You may report side effects to FDA at 1-800-FDA-1088. Where should I keep my medicine? This medicine is only given in a clinic, doctor's office, or other health care setting and will not be stored at home. NOTE: This sheet is a summary. It may not cover all possible information. If you have questions about this medicine, talk to your doctor, pharmacist, or health care provider.  2021 [employer] (2017-12-30 16:10:44)

## 2020-11-26 ENCOUNTER — Other Ambulatory Visit: Payer: Self-pay

## 2020-11-26 ENCOUNTER — Encounter (HOSPITAL_COMMUNITY): Payer: Self-pay

## 2020-11-26 ENCOUNTER — Encounter (HOSPITAL_COMMUNITY)
Admission: RE | Admit: 2020-11-26 | Discharge: 2020-11-26 | Disposition: A | Discharge status: Home / Self Care | Payer: Medicare Other | Source: Ambulatory Visit | Attending: Internal Medicine | Admitting: Internal Medicine

## 2020-12-04 ENCOUNTER — Other Ambulatory Visit: Payer: Self-pay | Admitting: Internal Medicine

## 2020-12-04 DIAGNOSIS — M5442 Lumbago with sciatica, left side: Secondary | ICD-10-CM

## 2020-12-19 ENCOUNTER — Encounter (HOSPITAL_COMMUNITY): Payer: Medicare Other

## 2020-12-30 ENCOUNTER — Other Ambulatory Visit: Payer: Medicare Other

## 2021-04-08 ENCOUNTER — Encounter (HOSPITAL_COMMUNITY): Payer: Medicare Other

## 2021-04-14 ENCOUNTER — Other Ambulatory Visit (HOSPITAL_COMMUNITY): Payer: Self-pay | Admitting: *Deleted

## 2021-04-15 ENCOUNTER — Encounter (HOSPITAL_COMMUNITY): Payer: Medicare Other

## 2021-05-04 ENCOUNTER — Other Ambulatory Visit (HOSPITAL_COMMUNITY): Payer: Self-pay | Admitting: *Deleted

## 2021-05-05 ENCOUNTER — Encounter (HOSPITAL_COMMUNITY): Payer: Medicare Other

## 2021-05-06 ENCOUNTER — Encounter: Payer: Self-pay | Admitting: Gastroenterology

## 2021-05-15 ENCOUNTER — Other Ambulatory Visit: Payer: Self-pay

## 2021-05-15 ENCOUNTER — Ambulatory Visit (HOSPITAL_COMMUNITY)
Admission: RE | Admit: 2021-05-15 | Discharge: 2021-05-15 | Disposition: A | Discharge status: Home / Self Care | Payer: Medicare Other | Source: Ambulatory Visit | Attending: Internal Medicine | Admitting: Internal Medicine

## 2021-05-15 DIAGNOSIS — M810 Age-related osteoporosis without current pathological fracture: Secondary | ICD-10-CM | POA: Insufficient documentation

## 2021-05-15 MED ORDER — DENOSUMAB 60 MG/ML ~~LOC~~ SOSY
60.0000 mg | PREFILLED_SYRINGE | Freq: Once | SUBCUTANEOUS | Status: AC
  Administered 2021-05-15: 60 mg via SUBCUTANEOUS

## 2021-05-15 MED ORDER — DENOSUMAB 60 MG/ML ~~LOC~~ SOSY
PREFILLED_SYRINGE | SUBCUTANEOUS | Status: AC
  Filled 2021-05-15: qty 1

## 2021-06-29 ENCOUNTER — Other Ambulatory Visit: Payer: Self-pay | Admitting: Internal Medicine

## 2021-06-29 DIAGNOSIS — Z1231 Encounter for screening mammogram for malignant neoplasm of breast: Secondary | ICD-10-CM

## 2021-07-17 ENCOUNTER — Ambulatory Visit: Payer: Medicare Other

## 2021-11-17 ENCOUNTER — Other Ambulatory Visit (HOSPITAL_COMMUNITY): Payer: Self-pay | Admitting: *Deleted

## 2021-11-18 ENCOUNTER — Encounter (HOSPITAL_COMMUNITY): Payer: Medicare Other

## 2022-01-08 ENCOUNTER — Other Ambulatory Visit: Payer: Self-pay

## 2022-01-11 ENCOUNTER — Telehealth: Payer: Self-pay | Admitting: Pharmacy Technician

## 2022-01-11 NOTE — Telephone Encounter (Addendum)
AMGEN BIV: COMPLETE  Auth Submission: no auth needed Payer: [employer] MEDICARE Medication & CPT/J Code(s) submitted: Prolia (Denosumab) [id number] Route of submission (phone, fax, portal): PORTAL Auth type: Buy/Bill Units/visits requested: X1 DOSE Reference number: 01/26/22 2:08p Amber-F Approval from:01/26/22 to 01/27/23 Last dose 05/15/21  Patient has Medicare A/B as primary and [employer] is secondary. No auth required. Medicare is primary and will cover 80% and [employer] will pick-up remaining 20%  MEDICARE ID: [id number]  Patient will need to have medicare card scanned upon arrival of appt.

## 2022-01-20 ENCOUNTER — Encounter: Payer: Self-pay | Admitting: Pulmonary Disease

## 2022-02-04 ENCOUNTER — Ambulatory Visit (INDEPENDENT_AMBULATORY_CARE_PROVIDER_SITE_OTHER): Payer: Medicare Other

## 2022-02-04 VITALS — BP 128/80 | HR 99 | Temp 97.4°F | Resp 20 | Ht 64.0 in | Wt 130.4 lb

## 2022-02-04 DIAGNOSIS — M818 Other osteoporosis without current pathological fracture: Secondary | ICD-10-CM

## 2022-02-04 MED ORDER — DENOSUMAB 60 MG/ML ~~LOC~~ SOSY
60.0000 mg | PREFILLED_SYRINGE | Freq: Once | SUBCUTANEOUS | Status: AC
  Administered 2022-02-04: 60 mg via SUBCUTANEOUS

## 2022-02-04 NOTE — Progress Notes (Signed)
Diagnosis: Osteoporosis  Provider:  Praveen Mannam, MD  Procedure: Injection  Prolia (Denosumab), Dose: 60 mg, Site: subcutaneous, Number of injections: 1  Discharge: Condition: Good, Destination: Home . AVS provided to patient.   Performed by:  Cicley Ganesh A Ralene Gasparyan, RN       

## 2022-10-01 ENCOUNTER — Other Ambulatory Visit (HOSPITAL_COMMUNITY): Payer: Self-pay | Admitting: *Deleted

## 2022-10-04 ENCOUNTER — Encounter (HOSPITAL_COMMUNITY): Payer: Medicare Other

## 2022-11-11 ENCOUNTER — Other Ambulatory Visit (HOSPITAL_COMMUNITY): Payer: Self-pay | Admitting: *Deleted

## 2022-11-15 ENCOUNTER — Encounter (HOSPITAL_COMMUNITY): Payer: Medicare Other

## 2023-05-19 ENCOUNTER — Other Ambulatory Visit (HOSPITAL_COMMUNITY): Payer: Self-pay | Admitting: *Deleted

## 2023-05-20 ENCOUNTER — Inpatient Hospital Stay (HOSPITAL_COMMUNITY): Admission: RE | Admit: 2023-05-20 | Payer: Medicare Other | Source: Ambulatory Visit

## 2023-06-16 ENCOUNTER — Other Ambulatory Visit (HOSPITAL_COMMUNITY): Payer: Self-pay

## 2023-06-20 ENCOUNTER — Ambulatory Visit (HOSPITAL_COMMUNITY)
Admission: RE | Admit: 2023-06-20 | Discharge: 2023-06-20 | Disposition: A | Discharge status: Home / Self Care | Payer: Medicare Other | Source: Ambulatory Visit | Attending: Internal Medicine | Admitting: Internal Medicine

## 2023-06-20 DIAGNOSIS — M810 Age-related osteoporosis without current pathological fracture: Secondary | ICD-10-CM | POA: Diagnosis present

## 2023-06-20 MED ORDER — DENOSUMAB 60 MG/ML ~~LOC~~ SOSY
60.0000 mg | PREFILLED_SYRINGE | Freq: Once | SUBCUTANEOUS | Status: AC
  Administered 2023-06-20: 60 mg via SUBCUTANEOUS

## 2023-06-20 MED ORDER — DENOSUMAB 60 MG/ML ~~LOC~~ SOSY
PREFILLED_SYRINGE | SUBCUTANEOUS | Status: AC
  Filled 2023-06-20: qty 1
